# Patient Record
Sex: Male | Born: 1960
Health system: Southern US, Community
[De-identification: ages and names within clinical notes are randomized; demographics above are authoritative.]

## PROBLEM LIST (undated history)

## (undated) DIAGNOSIS — Z87438 Personal history of other diseases of male genital organs: Secondary | ICD-10-CM

## (undated) DIAGNOSIS — K922 Gastrointestinal hemorrhage, unspecified: Secondary | ICD-10-CM

## (undated) DIAGNOSIS — M47812 Spondylosis without myelopathy or radiculopathy, cervical region: Secondary | ICD-10-CM

## (undated) DIAGNOSIS — Z789 Other specified health status: Secondary | ICD-10-CM

## (undated) DIAGNOSIS — K219 Gastro-esophageal reflux disease without esophagitis: Secondary | ICD-10-CM

## (undated) DIAGNOSIS — D509 Iron deficiency anemia, unspecified: Secondary | ICD-10-CM

## (undated) DIAGNOSIS — I10 Essential (primary) hypertension: Secondary | ICD-10-CM

## (undated) DIAGNOSIS — K259 Gastric ulcer, unspecified as acute or chronic, without hemorrhage or perforation: Secondary | ICD-10-CM

## (undated) DIAGNOSIS — F1090 Alcohol use, unspecified, uncomplicated: Secondary | ICD-10-CM

## (undated) DIAGNOSIS — J690 Pneumonitis due to inhalation of food and vomit: Secondary | ICD-10-CM

## (undated) DIAGNOSIS — Z7289 Other problems related to lifestyle: Secondary | ICD-10-CM

## (undated) DIAGNOSIS — K2970 Gastritis, unspecified, without bleeding: Secondary | ICD-10-CM

## (undated) HISTORY — DX: Spondylosis without myelopathy or radiculopathy, cervical region: M47.812

## (undated) HISTORY — DX: Pneumonitis due to inhalation of food and vomit: J69.0

## (undated) HISTORY — DX: Gastrointestinal hemorrhage, unspecified: K92.2

## (undated) HISTORY — DX: Other problems related to lifestyle: Z72.89

## (undated) HISTORY — DX: Gastro-esophageal reflux disease without esophagitis: K21.9

## (undated) HISTORY — DX: Personal history of other diseases of male genital organs: Z87.438

## (undated) HISTORY — DX: Alcohol use, unspecified, uncomplicated: F10.90

## (undated) HISTORY — DX: Gastritis, unspecified, without bleeding: K29.70

## (undated) HISTORY — PX: ABDOMINAL SURGERY: SHX537

## (undated) HISTORY — DX: Gastric ulcer, unspecified as acute or chronic, without hemorrhage or perforation: K25.9

## (undated) HISTORY — PX: COLONOSCOPY: SHX174

## (undated) HISTORY — DX: Iron deficiency anemia, unspecified: D50.9

## (undated) HISTORY — DX: Other specified health status: Z78.9

---

## 1994-03-20 DIAGNOSIS — K220 Achalasia of cardia: Secondary | ICD-10-CM

## 1994-03-20 HISTORY — DX: Achalasia of cardia: K22.0

## 1994-03-20 HISTORY — PX: HELLER MYOTOMY: SHX5259

## 2019-09-21 ENCOUNTER — Emergency Department (HOSPITAL_BASED_OUTPATIENT_CLINIC_OR_DEPARTMENT_OTHER): Payer: BC Managed Care – PPO

## 2019-09-21 ENCOUNTER — Encounter (HOSPITAL_BASED_OUTPATIENT_CLINIC_OR_DEPARTMENT_OTHER): Payer: Self-pay | Admitting: Emergency Medicine

## 2019-09-21 ENCOUNTER — Emergency Department (HOSPITAL_BASED_OUTPATIENT_CLINIC_OR_DEPARTMENT_OTHER)
Admission: EM | Admit: 2019-09-21 | Discharge: 2019-09-21 | Disposition: A | Discharge status: Home / Self Care | Payer: BC Managed Care – PPO | Attending: Emergency Medicine | Admitting: Emergency Medicine

## 2019-09-21 ENCOUNTER — Other Ambulatory Visit: Payer: Self-pay

## 2019-09-21 DIAGNOSIS — Z79899 Other long term (current) drug therapy: Secondary | ICD-10-CM | POA: Insufficient documentation

## 2019-09-21 DIAGNOSIS — S61432A Puncture wound without foreign body of left hand, initial encounter: Secondary | ICD-10-CM | POA: Insufficient documentation

## 2019-09-21 DIAGNOSIS — Y9289 Other specified places as the place of occurrence of the external cause: Secondary | ICD-10-CM | POA: Diagnosis not present

## 2019-09-21 DIAGNOSIS — Y9389 Activity, other specified: Secondary | ICD-10-CM | POA: Insufficient documentation

## 2019-09-21 DIAGNOSIS — I10 Essential (primary) hypertension: Secondary | ICD-10-CM | POA: Diagnosis not present

## 2019-09-21 DIAGNOSIS — Y999 Unspecified external cause status: Secondary | ICD-10-CM | POA: Diagnosis not present

## 2019-09-21 DIAGNOSIS — W260XXA Contact with knife, initial encounter: Secondary | ICD-10-CM | POA: Insufficient documentation

## 2019-09-21 DIAGNOSIS — S61419A Laceration without foreign body of unspecified hand, initial encounter: Secondary | ICD-10-CM

## 2019-09-21 HISTORY — DX: Essential (primary) hypertension: I10

## 2019-09-21 MED ORDER — METOPROLOL TARTRATE 50 MG PO TABS
50.0000 mg | ORAL_TABLET | Freq: Two times a day (BID) | ORAL | 0 refills | Status: DC
Start: 2019-09-21 — End: 2020-02-13

## 2019-09-21 MED ORDER — LISINOPRIL 10 MG PO TABS
10.0000 mg | ORAL_TABLET | Freq: Every day | ORAL | 0 refills | Status: DC
Start: 2019-09-21 — End: 2020-02-13

## 2019-09-21 MED ORDER — METOPROLOL TARTRATE 50 MG PO TABS
25.0000 mg | ORAL_TABLET | Freq: Once | ORAL | Status: AC
  Administered 2019-09-21: 25 mg via ORAL
  Filled 2019-09-21: qty 1

## 2019-09-21 MED ORDER — TETANUS-DIPHTH-ACELL PERTUSSIS 5-2.5-18.5 LF-MCG/0.5 IM SUSP: 0.5000 mL | Freq: Once | INTRAMUSCULAR | Status: DC

## 2019-09-21 MED ORDER — CEPHALEXIN 500 MG PO CAPS
500.0000 mg | ORAL_CAPSULE | Freq: Two times a day (BID) | ORAL | 0 refills | Status: AC
Start: 2019-09-21 — End: 2019-09-24

## 2019-09-21 MED ORDER — LISINOPRIL 10 MG PO TABS
10.0000 mg | ORAL_TABLET | Freq: Once | ORAL | Status: AC
  Administered 2019-09-21: 10 mg via ORAL
  Filled 2019-09-21: qty 1

## 2019-09-21 MED ORDER — CEPHALEXIN 250 MG PO CAPS
500.0000 mg | ORAL_CAPSULE | Freq: Once | ORAL | Status: AC
  Administered 2019-09-21: 500 mg via ORAL
  Filled 2019-09-21: qty 2

## 2019-09-21 MED ORDER — LIDOCAINE-EPINEPHRINE 1 %-1:100000 IJ SOLN
10.0000 mL | Freq: Once | INTRAMUSCULAR | Status: AC
  Administered 2019-09-21: 10 mL via INTRADERMAL
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
## 2019-09-21 NOTE — ED Notes (Signed)
No acute bleeding noted at this time.  Pressure dressing in place to left hand.
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
## 2019-09-23 ENCOUNTER — Emergency Department (HOSPITAL_BASED_OUTPATIENT_CLINIC_OR_DEPARTMENT_OTHER)
Admission: EM | Admit: 2019-09-23 | Discharge: 2019-09-23 | Disposition: A | Discharge status: Home / Self Care | Payer: BC Managed Care – PPO | Attending: Emergency Medicine | Admitting: Emergency Medicine

## 2019-09-23 ENCOUNTER — Other Ambulatory Visit: Payer: Self-pay

## 2019-09-23 ENCOUNTER — Encounter (HOSPITAL_BASED_OUTPATIENT_CLINIC_OR_DEPARTMENT_OTHER): Payer: Self-pay

## 2019-09-23 DIAGNOSIS — Z5189 Encounter for other specified aftercare: Secondary | ICD-10-CM | POA: Diagnosis not present

## 2019-09-23 DIAGNOSIS — Z4800 Encounter for change or removal of nonsurgical wound dressing: Secondary | ICD-10-CM | POA: Insufficient documentation

## 2019-09-23 DIAGNOSIS — Z79899 Other long term (current) drug therapy: Secondary | ICD-10-CM | POA: Insufficient documentation

## 2019-09-23 DIAGNOSIS — I10 Essential (primary) hypertension: Secondary | ICD-10-CM | POA: Insufficient documentation
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
## 2019-09-23 NOTE — ED Notes (Signed)
Pt discharged to home. Discharge instructions have been discussed with patient and/or family members. Pt verbally acknowledges understanding d/c instructions, and endorses comprehension to checkout at registration before leaving.  °

## 2019-09-23 NOTE — Discharge Instructions (Addendum)
Keep wound covered with a bandaid. I would not recommend wrapping it as it seems to be causing some swelling. While at home please rest, ice, and elevate your hand above your heart to reduce the swelling.  Begin taking your antibiotic as prescribed. Continue taking Ibuprofen as needed for the pain.  Return in 10 days from suture placement for removal. Return to the ED sooner for any signs of infection including redness/swelling around the sutures themselves, drainage of pus, fevers > 100.4, or chills.

## 2019-10-01 ENCOUNTER — Emergency Department (HOSPITAL_BASED_OUTPATIENT_CLINIC_OR_DEPARTMENT_OTHER)
Admission: EM | Admit: 2019-10-01 | Discharge: 2019-10-01 | Disposition: A | Discharge status: Home / Self Care | Payer: BC Managed Care – PPO | Attending: Emergency Medicine | Admitting: Emergency Medicine

## 2019-10-01 ENCOUNTER — Other Ambulatory Visit: Payer: Self-pay

## 2019-10-01 ENCOUNTER — Encounter (HOSPITAL_BASED_OUTPATIENT_CLINIC_OR_DEPARTMENT_OTHER): Payer: Self-pay

## 2019-10-01 DIAGNOSIS — I10 Essential (primary) hypertension: Secondary | ICD-10-CM | POA: Diagnosis not present

## 2019-10-01 DIAGNOSIS — Z4802 Encounter for removal of sutures: Secondary | ICD-10-CM | POA: Diagnosis not present

## 2019-10-01 DIAGNOSIS — Z79899 Other long term (current) drug therapy: Secondary | ICD-10-CM | POA: Diagnosis not present

## 2019-10-01 NOTE — Discharge Instructions (Addendum)
Please make sure to keep the area clean and dry.  Monitor closely for worsening signs of infection which includes increasing redness, drainage, pus, swelling, fevers, chills.  Your blood pressure is still high today, please make sure to call [place] and wellness with the phone number provided and schedule an appointment as soon as possible.   You may also call the phone number on your discharge paperwork since you have insurance in order to help establish with a primary care doctor.  You need to make sure to do this as your blood pressure is still high today and this is very important for management of your chronic problems. Return to the ER if your symptoms worsen.

## 2019-10-01 NOTE — ED Provider Notes (Signed)
[facility] EMERGENCY DEPARTMENT Provider Note   CSN: [id number] Arrival date & time: 10/01/19  1348     History Chief Complaint  Patient presents with  . Suture / Staple Removal    Nathaniel Williams is a 60 y.o. male.  HPI 59 year old male with a history of hypertension, presents to the ER for suture removal.  Patient was seen here in the ER 10 days ago when he accidentally stabbed himself into his left hand while trying to open a can.  He was noted to have a possible arterial bleed as he had heavy bleeding that required a tourniquet to decrease.  Wound was irrigated, and imaged, discussed sutured.  Patient was also given Keflex.  He was also noted to be hypertensive in the ED, and was given a prescription for blood pressure medications.  He does have insurance, but has not followed up with any primary care doctor since then.  He reports compliance with the blood pressure medications he was given which include metoprolol and lisinopril.  He denies any fevers or chills, or pain to the left hand.  He does note that it is still a little bit swollen.  No numbness or tingling.  He has been compliant with antibiotics and has been keeping the area clean and dry.    Past Medical History:  Diagnosis Date  . Hypertension     There are no problems to display for this patient.   Past Surgical History:  Procedure Laterality Date  . ABDOMINAL SURGERY         No family history on file.  Social History   Tobacco Use  . Smoking status: Never Smoker  . Smokeless tobacco: Never Used  Substance Use Topics  . Alcohol use: Yes    Comment: occ  . Drug use: Never    Home Medications Prior to Admission medications   Medication Sig Start Date End Date Taking? Authorizing Provider  lisinopril (ZESTRIL) 10 MG tablet Take 1 tablet (10 mg total) by mouth daily. 09/21/19 10/21/19  Fatima Blank, MD  lisinopril (ZESTRIL) 40 MG tablet Take by mouth. 05/17/18   [provider]  metoprolol succinate (TOPROL-XL) 50 MG 24 hr tablet Take by mouth. 08/22/18   [provider]  metoprolol tartrate (LOPRESSOR) 50 MG tablet Take 1 tablet (50 mg total) by mouth 2 (two) times daily. 09/21/19 10/21/19  Fatima Blank, MD    Allergies    Patient has no known allergies.  Review of Systems   Review of Systems  Constitutional: Negative for chills and fever.  Skin: Positive for wound.  Neurological: Negative for weakness and numbness.    Physical Exam Updated Vital Signs BP (!) 185/102 (BP Location: Right Arm)   Pulse 63   Temp 98.3 F (36.8 C)   Resp 16   Ht 5\' 9"  (1.753 m)   Wt 54 kg   SpO2 98%   BMI 17.57 kg/m   Physical Exam Vitals and nursing note reviewed.  Constitutional:      General: He is not in acute distress.    Appearance: Normal appearance. He is well-developed. He is not ill-appearing or diaphoretic.  HENT:     Head: Normocephalic and atraumatic.  Eyes:     Conjunctiva/sclera: Conjunctivae normal.  Cardiovascular:     Rate and Rhythm: Normal rate and regular rhythm.     Pulses: Normal pulses.     Heart sounds: Normal heart sounds. No murmur heard.   Pulmonary:  Effort: Pulmonary effort is normal. No respiratory distress.     Breath sounds: Normal breath sounds.  Abdominal:     Palpations: Abdomen is soft.     Tenderness: There is no abdominal tenderness.  Musculoskeletal:        General: Swelling (mild swelling to left hand ) present. No tenderness. Normal range of motion.     Cervical back: Neck supple.  Skin:    General: Skin is warm and dry.     Findings: No bruising, erythema or rash.     Comments: Left hand with evidence of skin tear, with some trace bleeding.  Mild swelling to the hand, however no overlying erythema, fluctuance, excessive drainage.  Full range of motion of all 5 digits.  2+ radial pulses.  Neurological:     General: No focal deficit present.     Mental Status: He is alert and oriented to person,  place, and time.     Sensory: No sensory deficit.     Motor: No weakness.  Psychiatric:        Mood and Affect: Mood normal.        Behavior: Behavior normal.       ED Results / Procedures / Treatments   Labs (all labs ordered are listed, but only abnormal results are displayed) Labs Reviewed - No data to display  EKG None  Radiology No results found.  Procedures Procedures (including critical care time)  Medications Ordered in ED Medications - No data to display  ED Course  I have reviewed the triage vital signs and the nursing notes.  Pertinent labs & imaging results that were available during my care of the patient were reviewed by me and considered in my medical decision making (see chart for details).    MDM Rules/Calculators/A&P                         59 year old male here for suture removal Left hand with some mild swelling, scant blood coming from the wound, however sutures still intact.  No evidence of dehiscence.  No tenderness to palpation, patient moving all 5 digits without difficulty.  Good pulses and sensations intact.  Patient states that he has been compliant with his course of Keflex.  Patient was also seen by Dr. Stark Jock, we feel as though his sutures can be removed and no additional antibiotic treatment is needed at this time.  There is no signs of cellulitis, abscess, or worsening infection.  He was still noted to be hypertensive here in the ED with a blood pressure 185/102, and states he has been compliant with blood pressure medications.  He has not followed up with the PCP.  I stressed that he really needs to establish with 1, directed him to [facility] health and wellness or the phone number on the discharge paperwork in order to establish with 1.  He does not have any headaches, chest pain, shortness of breath.  No signs of hypertensive urgency/emergency.  I gave him strict return precautions for worsening redness, swelling, discharge, fevers, chills or pain in  the hand.  He voices understanding and is agreeable to this plan.  At this stage in the ED course, the patient is medically screened and stable for discharge.  Final Clinical Impression(s) / ED Diagnoses Final diagnoses:  Visit for suture removal    Rx / DC Orders ED Discharge Orders    None       Garald Balding, PA-C 10/01/19  1191    Veryl Speak, MD 10/01/19 2358

## 2019-10-01 NOTE — ED Triage Notes (Signed)
Pt for suture removal left hand-NAD-steady gait

## 2020-02-13 ENCOUNTER — Encounter (HOSPITAL_BASED_OUTPATIENT_CLINIC_OR_DEPARTMENT_OTHER): Payer: Self-pay

## 2020-02-13 ENCOUNTER — Emergency Department (HOSPITAL_BASED_OUTPATIENT_CLINIC_OR_DEPARTMENT_OTHER)
Admission: EM | Admit: 2020-02-13 | Discharge: 2020-02-13 | Disposition: A | Discharge status: Home / Self Care | Payer: BC Managed Care – PPO | Attending: Emergency Medicine | Admitting: Emergency Medicine

## 2020-02-13 ENCOUNTER — Other Ambulatory Visit: Payer: Self-pay

## 2020-02-13 DIAGNOSIS — I10 Essential (primary) hypertension: Secondary | ICD-10-CM | POA: Insufficient documentation

## 2020-02-13 DIAGNOSIS — Z79899 Other long term (current) drug therapy: Secondary | ICD-10-CM | POA: Insufficient documentation

## 2020-02-13 DIAGNOSIS — R030 Elevated blood-pressure reading, without diagnosis of hypertension: Secondary | ICD-10-CM | POA: Diagnosis present

## 2020-02-13 LAB — CBC WITH DIFFERENTIAL/PLATELET
Abs Immature Granulocytes: 0.01 10*3/uL (ref 0.00–0.07)
Basophils Absolute: 0 10*3/uL (ref 0.0–0.1)
Basophils Relative: 1 %
Eosinophils Absolute: 0.1 10*3/uL (ref 0.0–0.5)
Eosinophils Relative: 2 %
HCT: 41 % (ref 39.0–52.0)
Hemoglobin: 14 g/dL (ref 13.0–17.0)
Immature Granulocytes: 0 %
Lymphocytes Relative: 36 %
Lymphs Abs: 1.7 10*3/uL (ref 0.7–4.0)
MCH: 29.7 pg (ref 26.0–34.0)
MCHC: 34.1 g/dL (ref 30.0–36.0)
MCV: 86.9 fL (ref 80.0–100.0)
Monocytes Absolute: 0.5 10*3/uL (ref 0.1–1.0)
Monocytes Relative: 10 %
Neutro Abs: 2.4 10*3/uL (ref 1.7–7.7)
Neutrophils Relative %: 51 %
Platelets: 209 10*3/uL (ref 150–400)
RBC: 4.72 MIL/uL (ref 4.22–5.81)
RDW: 11.9 % (ref 11.5–15.5)
WBC: 4.6 10*3/uL (ref 4.0–10.5)
nRBC: 0 % (ref 0.0–0.2)

## 2020-02-13 LAB — URINALYSIS, ROUTINE W REFLEX MICROSCOPIC
Bilirubin Urine: NEGATIVE
Glucose, UA: NEGATIVE mg/dL
Hgb urine dipstick: NEGATIVE
Ketones, ur: NEGATIVE mg/dL
Leukocytes,Ua: NEGATIVE
Nitrite: NEGATIVE
Protein, ur: NEGATIVE mg/dL
Specific Gravity, Urine: 1.02 (ref 1.005–1.030)
pH: 6.5 (ref 5.0–8.0)

## 2020-02-13 LAB — BASIC METABOLIC PANEL
Anion gap: 9 (ref 5–15)
BUN: 18 mg/dL (ref 8–23)
CO2: 28 mmol/L (ref 22–32)
Calcium: 9 mg/dL (ref 8.9–10.3)
Chloride: 102 mmol/L (ref 98–111)
Creatinine, Ser: 0.99 mg/dL (ref 0.61–1.24)
GFR, Estimated: >60 mL/min (ref >60)
Glucose, Bld: 102 mg/dL — ABNORMAL HIGH (ref 70–99)
Potassium: 3.2 mmol/L — ABNORMAL LOW (ref 3.5–5.1)
Sodium: 139 mmol/L (ref 135–145)

## 2020-02-13 MED ORDER — METOPROLOL TARTRATE 50 MG PO TABS: 50.0000 mg | ORAL_TABLET | Freq: Two times a day (BID) | ORAL | 2 refills | Status: DC

## 2020-02-13 MED ORDER — METOPROLOL TARTRATE 25 MG PO TABS
50.0000 mg | ORAL_TABLET | Freq: Once | ORAL | Status: AC
  Administered 2020-02-13: 50 mg via ORAL
  Filled 2020-02-13: qty 2

## 2020-02-13 MED ORDER — LISINOPRIL 10 MG PO TABS
10.0000 mg | ORAL_TABLET | Freq: Once | ORAL | Status: AC
  Administered 2020-02-13: 10 mg via ORAL
  Filled 2020-02-13: qty 1

## 2020-02-13 MED ORDER — LISINOPRIL 10 MG PO TABS: 10.0000 mg | ORAL_TABLET | Freq: Every day | ORAL | 2 refills | Status: DC

## 2020-02-13 NOTE — ED Provider Notes (Signed)
Nathaniel Williams   CSN: [id number] Arrival date & time: 02/13/20  0844     History Chief Complaint  Patient presents with  . Hypertension    Nathaniel Williams is a 59 y.o. male.  Pt presents to the ED today with high blood pressure.  He has had bp issues for several years, but does not have a pcp to monitor his bp.  He was here in July and had his meds refilled.  He took them until they ran out, then stopped taking them.  He was seen at an urgent care on 11/24 for an injury at work.  He was told his bp was elevated and told to come to the ED for treatment.  He developed some neck pain last night, so thought he'd come in today for bp meds.  He does have medical insurance.        Past Medical History:  Diagnosis Date  . Hypertension     There are no problems to display for this patient.   Past Surgical History:  Procedure Laterality Date  . ABDOMINAL SURGERY         History reviewed. No pertinent family history.  Social History   Tobacco Use  . Smoking status: Never Smoker  . Smokeless tobacco: Never Used  Substance Use Topics  . Alcohol use: Not Currently  . Drug use: Never    Home Medications Prior to Admission medications   Medication Sig Start Date End Date Taking? Authorizing Provider  lisinopril (ZESTRIL) 10 MG tablet Take 1 tablet (10 mg total) by mouth daily. 02/13/20 05/13/20  Isla Pence, MD  metoprolol tartrate (LOPRESSOR) 50 MG tablet Take 1 tablet (50 mg total) by mouth 2 (two) times daily. 02/13/20 05/13/20  Isla Pence, MD  metoprolol succinate (TOPROL-XL) 50 MG 24 hr tablet Take by mouth. 08/22/18 02/13/20  [provider]    Allergies    Patient has no known allergies.  Review of Systems   Review of Systems  Musculoskeletal: Positive for neck pain.  All other systems reviewed and are negative.   Physical Exam Updated Vital Signs BP (!) 199/125 (BP Location: Right Arm)   Pulse 69    Temp 98 F (36.7 C) (Oral)   Resp 18   Ht 5\' 9"  (1.753 m)   Wt 55.3 kg   SpO2 97%   BMI 18.02 kg/m   Physical Exam Vitals and nursing Williams reviewed.  Constitutional:      Appearance: Normal appearance.  HENT:     Head: Normocephalic and atraumatic.     Right Ear: External ear normal.     Left Ear: External ear normal.     Nose: Nose normal.     Mouth/Throat:     Mouth: Mucous membranes are moist.     Pharynx: Oropharynx is clear.  Eyes:     Extraocular Movements: Extraocular movements intact.     Conjunctiva/sclera: Conjunctivae normal.     Pupils: Pupils are equal, round, and reactive to light.  Cardiovascular:     Rate and Rhythm: Normal rate and regular rhythm.     Pulses: Normal pulses.     Heart sounds: Normal heart sounds.  Pulmonary:     Effort: Pulmonary effort is normal.     Breath sounds: Normal breath sounds.  Abdominal:     General: Abdomen is flat. Bowel sounds are normal.     Palpations: Abdomen is soft.  Musculoskeletal:        General:  Normal range of motion.     Cervical back: Normal range of motion and neck supple.  Skin:    General: Skin is warm and dry.     Capillary Refill: Capillary refill takes less than 2 seconds.  Neurological:     General: No focal deficit present.     Mental Status: He is alert and oriented to person, place, and time.  Psychiatric:        Mood and Affect: Mood normal.        Behavior: Behavior normal.     ED Results / Procedures / Treatments   Labs (all labs ordered are listed, but only abnormal results are displayed) Labs Reviewed  BASIC METABOLIC PANEL - Abnormal; Notable for the following components:      Result Value   Potassium 3.2 (*)    Glucose, Bld 102 (*)    All other components within normal limits  CBC WITH DIFFERENTIAL/PLATELET  URINALYSIS, ROUTINE W REFLEX MICROSCOPIC    EKG EKG Interpretation  Date/Time:  Friday February 13 2020 09:49:20 EST Ventricular Rate:  64 PR Interval:    QRS  Duration: 73 QT Interval:  437 QTC Calculation: 451 R Axis:   81 Text Interpretation: Sinus rhythm Left atrial enlargement Borderline right axis deviation LVH with secondary repolarization abnormality Minimal ST elevation, inferior leads No old tracing to compare Confirmed by Isla Pence [phone]) on 02/13/2020 9:54:38 AM   Radiology No results found.  Procedures Procedures (including critical care time)  Medications Ordered in ED Medications  lisinopril (ZESTRIL) tablet 10 mg (10 mg Oral Given 02/13/20 0923)  metoprolol tartrate (LOPRESSOR) tablet 50 mg (50 mg Oral Given 02/13/20 2671)    ED Course  I have reviewed the triage vital signs and the nursing notes.  Pertinent labs & imaging results that were available during my care of the patient were reviewed by me and considered in my medical decision making (see chart for details).    MDM Rules/Calculators/A&P                          Pt given a dose of his meds here and is d/c with a 90 day supply.  He is encouraged to establish with a primary care provider.  He is encouraged to eat a low salt, and healthy diet.  Return if worse.  Final Clinical Impression(s) / ED Diagnoses Final diagnoses:  Primary hypertension    Rx / DC Orders ED Discharge Orders         Ordered    lisinopril (ZESTRIL) 10 MG tablet  Daily        02/13/20 0953    metoprolol tartrate (LOPRESSOR) 50 MG tablet  2 times daily        02/13/20 2458           Isla Pence, MD 02/13/20 [phone]

## 2020-02-13 NOTE — Discharge Instructions (Addendum)
It is very important you establish yourself with a PCP.  Call on Monday for an appointment.

## 2020-02-13 NOTE — ED Triage Notes (Signed)
Pt reports he stopped taking his blood pressure medication in July and since then has had high blood pressure. [facility] and told to go to ER for hypertension but felt fine so he didn't go. Pt states his neck started hurting yesterday so he decided to come in. Pt states he was taking metoprolol and lisinopril but doesn't have a primary care to refill the prescription.

## 2020-04-23 ENCOUNTER — Other Ambulatory Visit: Payer: Self-pay

## 2020-04-23 ENCOUNTER — Other Ambulatory Visit: Payer: Medicaid Other

## 2020-04-23 DIAGNOSIS — Z20822 Contact with and (suspected) exposure to covid-19: Secondary | ICD-10-CM

## 2020-04-24 LAB — NOVEL CORONAVIRUS, NAA: SARS-CoV-2, NAA: NOT DETECTED

## 2020-04-24 LAB — SARS-COV-2, NAA 2 DAY TAT

## 2020-04-26 ENCOUNTER — Other Ambulatory Visit: Payer: Self-pay

## 2020-04-26 ENCOUNTER — Ambulatory Visit (INDEPENDENT_AMBULATORY_CARE_PROVIDER_SITE_OTHER): Payer: Self-pay | Admitting: Medical

## 2020-04-26 ENCOUNTER — Encounter: Payer: Self-pay | Admitting: Medical

## 2020-04-26 VITALS — BP 132/76 | HR 58 | Temp 98.2°F | Resp 12 | Ht 70.2 in | Wt 121.4 lb

## 2020-04-26 DIAGNOSIS — R634 Abnormal weight loss: Secondary | ICD-10-CM

## 2020-04-26 DIAGNOSIS — Z113 Encounter for screening for infections with a predominantly sexual mode of transmission: Secondary | ICD-10-CM

## 2020-04-26 DIAGNOSIS — R630 Anorexia: Secondary | ICD-10-CM

## 2020-04-26 DIAGNOSIS — I10 Essential (primary) hypertension: Secondary | ICD-10-CM

## 2020-04-26 DIAGNOSIS — Z125 Encounter for screening for malignant neoplasm of prostate: Secondary | ICD-10-CM

## 2020-04-26 DIAGNOSIS — K219 Gastro-esophageal reflux disease without esophagitis: Secondary | ICD-10-CM

## 2020-04-26 DIAGNOSIS — R350 Frequency of micturition: Secondary | ICD-10-CM

## 2020-04-26 DIAGNOSIS — E860 Dehydration: Secondary | ICD-10-CM

## 2020-04-26 DIAGNOSIS — R142 Eructation: Secondary | ICD-10-CM

## 2020-04-26 DIAGNOSIS — R5383 Other fatigue: Secondary | ICD-10-CM

## 2020-04-26 DIAGNOSIS — Z1211 Encounter for screening for malignant neoplasm of colon: Secondary | ICD-10-CM

## 2020-04-26 DIAGNOSIS — G4700 Insomnia, unspecified: Secondary | ICD-10-CM

## 2020-04-26 LAB — CBC WITH DIFFERENTIAL/PLATELET
Basophils Absolute: 0 10*3/uL (ref 0.0–0.1)
Basophils Relative: 0.5 % (ref 0.0–3.0)
Eosinophils Absolute: 0.2 10*3/uL (ref 0.0–0.7)
Eosinophils Relative: 2.5 % (ref 0.0–5.0)
HCT: 42.3 % (ref 39.0–52.0)
Hemoglobin: 14.4 g/dL (ref 13.0–17.0)
Lymphocytes Relative: 15.5 % (ref 12.0–46.0)
Lymphs Abs: 1 10*3/uL (ref 0.7–4.0)
MCHC: 34.1 g/dL (ref 30.0–36.0)
MCV: 86.1 fl (ref 78.0–100.0)
Monocytes Absolute: 0.4 10*3/uL (ref 0.1–1.0)
Monocytes Relative: 6.7 % (ref 3.0–12.0)
Neutro Abs: 4.9 10*3/uL (ref 1.4–7.7)
Neutrophils Relative %: 74.8 % (ref 43.0–77.0)
Platelets: 293 10*3/uL (ref 150.0–400.0)
RBC: 4.91 Mil/uL (ref 4.22–5.81)
RDW: 13.2 % (ref 11.5–15.5)
WBC: 6.6 10*3/uL (ref 4.0–10.5)

## 2020-04-26 LAB — POC URINALSYSI DIPSTICK (AUTOMATED)
Bilirubin, UA: 1
Blood, UA: NEGATIVE
Glucose, UA: NEGATIVE
Leukocytes, UA: NEGATIVE
Nitrite, UA: NEGATIVE
Protein, UA: POSITIVE — AB
Spec Grav, UA: >=1.03 — AB (ref 1.010–1.025)
Urobilinogen, UA: 1 E.U./dL
pH, UA: 6 (ref 5.0–8.0)

## 2020-04-26 LAB — IRON: Iron: 108 ug/dL (ref 42–165)

## 2020-04-26 LAB — COMPREHENSIVE METABOLIC PANEL
ALT: 14 U/L (ref 0–53)
AST: 17 U/L (ref 0–37)
Albumin: 4.2 g/dL (ref 3.5–5.2)
Alkaline Phosphatase: 51 U/L (ref 39–117)
BUN: 9 mg/dL (ref 6–23)
CO2: 31 mEq/L (ref 19–32)
Calcium: 9.6 mg/dL (ref 8.4–10.5)
Chloride: 100 mEq/L (ref 96–112)
Creatinine, Ser: 0.97 mg/dL (ref 0.40–1.50)
GFR: 84.35 mL/min (ref >60.00)
Glucose, Bld: 102 mg/dL — ABNORMAL HIGH (ref 70–99)
Potassium: 3.5 mEq/L (ref 3.5–5.1)
Sodium: 138 mEq/L (ref 135–145)
Total Bilirubin: 0.7 mg/dL (ref 0.2–1.2)
Total Protein: 6.8 g/dL (ref 6.0–8.3)

## 2020-04-26 LAB — PSA: PSA: 0.88 ng/mL (ref 0.10–4.00)

## 2020-04-26 LAB — VITAMIN B12: Vitamin B-12: 1485 pg/mL — ABNORMAL HIGH (ref 211–911)

## 2020-04-26 LAB — LIPASE: Lipase: 53 U/L (ref 11.0–59.0)

## 2020-04-26 LAB — TSH: TSH: 1.79 u[IU]/mL (ref 0.35–4.50)

## 2020-04-26 LAB — T4, FREE: Free T4: 1.3 ng/dL (ref 0.60–1.60)

## 2020-04-26 MED ORDER — METOPROLOL SUCCINATE ER 50 MG PO TB24: 50.0000 mg | ORAL_TABLET | Freq: Every day | ORAL | 1 refills | Status: DC

## 2020-04-26 MED ORDER — LISINOPRIL 10 MG PO TABS: 10.0000 mg | ORAL_TABLET | Freq: Every day | ORAL | 1 refills | Status: DC

## 2020-04-26 NOTE — Patient Instructions (Addendum)
Nice to meet you today.  History of hypertension and blood pressure is well controlled today.  I refilled your lisinopril 10 mg tablet daily and metoprolol 50 mg daily tablet.  Note I did write extended release version of metoprolol.  On review history of abnormal weight loss per records and recent loss of appetite.  I do not see prior colonoscopy done in the past.  So went ahead and referred to GI for screening colonoscopy.  In addition added lipase labs today.  For fatigue and history of hypertension ordered CBC, CMP, TSH, T4, B12, B1, vitamin D and iron level.  Screening HIV labs and screening PSA.  For recent frequent urination will follow PSA and did get urine POCT.  History of belching intermittently and history of GERD but no obvious reflux reported presently.  Did add H. pylori breath test to labs.  Recent insomnia couple days a week.  Discussed can try melatonin over-the-counter.  You do appear to have some dehydration. Ketones on urine. Very important to hydrate well with propel fitiness water or sugar free gatorade. Eat bland diet.  Follow-up in 2 weeks or as needed.

## 2020-04-26 NOTE — Progress Notes (Signed)
Subjective:    Patient ID: Nathaniel Williams, male    DOB: December 14, 1960, 60 y.o.   MRN: [id number]  HPI   Pt in for first time.  Pt works [employer]ar[employer]. Full time now. He had lost insurance briefly. Pt does not exercise apart from work.  Stopped smoking more than 15 years ago.   Pt states he has lost of appetitie. Decreased smell and some HA. He state he tested 4 times last weeks for covid and all negative. Pt lost some weight over past week. Usually he states weight 130. He states 130-135 lb for 10 or more years. IN epic found stating abnormal wt loss. His ha is resolved. No gross motor or sensory function deficits.  Pt has gotten covid booster. His first covid vaccine wa 03/18/20.   Hx of htn. His bp was 199/125 on ED visit in November. In past had been on lisinopril 10 mg daily and metoprolol 25 mg q day. Bp now controlled when placed       Review of Systems  Constitutional: Negative for chills, diaphoresis and fatigue.  HENT: Negative for congestion.   Respiratory: Negative for cough, chest tightness, shortness of breath and wheezing.   Cardiovascular: Negative for chest pain and palpitations.  Gastrointestinal: Negative for abdominal pain.  Genitourinary: Negative for dysuria.  Musculoskeletal: Negative for back pain.  Neurological: Negative for dizziness, weakness, light-headedness and numbness.  Hematological: Negative for adenopathy.  Psychiatric/Behavioral: Negative for behavioral problems and confusion.  Left will labs follow-up Past Medical History:  Diagnosis Date  . Hypertension      Social History   Socioeconomic History  . Marital status: Legally Separated    Spouse name: Not on file  . Number of children: Not on file  . Years of education: Not on file  . Highest education level: Not on file  Occupational History  . Not on file  Tobacco Use  . Smoking status: Former Smoker    Packs/day: 0.25    Years: 18.00    Pack years: 4.50    Types: Cigarettes  .  Smokeless tobacco: Never Used  Vaping Use  . Vaping Use: Never used  Substance and Sexual Activity  . Alcohol use: Not Currently  . Drug use: Never  . Sexual activity: Not on file  Other Topics Concern  . Not on file  Social History Narrative  . Not on file   Social Determinants of Health   Financial Resource Strain: Not on file  Food Insecurity: Not on file  Transportation Needs: Not on file  Physical Activity: Not on file  Stress: Not on file  Social Connections: Not on file  Intimate Partner Violence: Not on file    Past Surgical History:  Procedure Laterality Date  . ABDOMINAL SURGERY      Family History  Problem Relation Age of Onset  . Cervical cancer Mother   . Alzheimer's disease Father     No Known Allergies  Current Outpatient Medications on File Prior to Visit  Medication Sig Dispense Refill  . Ascorbic Acid (VITAMIN C) 1000 MG tablet Take 1,000 mg by mouth daily.    . B Complex-C (SUPER B COMPLEX PO) Take 1 tablet by mouth daily.    . Cholecalciferol (VITAMIN D3) 50 MCG (2000 UT) capsule Take 2,000 Units by mouth daily.    Marland Kitchen ibuprofen (ADVIL) 200 MG tablet Take 400 mg by mouth in the morning and at bedtime.    Marland Kitchen lisinopril (ZESTRIL) 10 MG tablet Take 1 tablet (  10 mg total) by mouth daily. 30 tablet 2  . metoprolol tartrate (LOPRESSOR) 50 MG tablet Take 1 tablet (50 mg total) by mouth 2 (two) times daily. 60 tablet 2  . Misc Natural Products (OSTEO BI-FLEX ADV TRIPLE ST PO) Take 1 tablet by mouth daily.    Marland Kitchen OVER THE COUNTER MEDICATION Take 1 tablet by mouth daily. [place] MEGA M[employer]ER[employer] AND METABOBLISM    . Turmeric Curcumin 500 MG CAPS Take 1 capsule by mouth daily.    . vitamin E 180 MG (400 UNITS) capsule Take 400 Units by mouth daily.    . [DISCONTINUED] metoprolol succinate (TOPROL-XL) 50 MG 24 hr tablet Take by mouth.     No current facility-administered medications on file prior to visit.    BP 132/76 (BP Location: Right Arm, Cuff Size: Normal)    Pulse (!) 58   Temp 98.2 F (36.8 C) (Oral)   Resp 12   Ht 5' 10.2" (1.783 m)   Wt 121 lb 6.4 oz (55.1 kg)   SpO2 99%   BMI 17.32 kg/m      Objective:   Physical Exam  General Mental Status- Alert. General Appearance- Not in acute distress.   Skin General: Color- Normal Color. Moisture- Normal Moisture.  Neck Carotid Arteries- Normal color. Moisture- Normal Moisture. No carotid bruits. No JVD.  Chest and Lung Exam Auscultation: Breath Sounds:-Normal.  Cardiovascular Auscultation:Rythm- Regular. Murmurs & Other Heart Sounds:Auscultation of the heart reveals- No Murmurs.  Abdomen Inspection:-Inspeection Normal. Palpation/Percussion:Note:No mass. Palpation and Percussion of the abdomen reveal- Non Tender, Non Distended + BS, no rebound or guarding.    Neurologic Cranial Nerve exam:- CN III-XII intact(No nystagmus), symmetric smile. Strength:- 5/5 equal and symmetric strength both upper and lower extremities.       Assessment & Plan:  Nice to meet you today.  History of hypertension and blood pressure is well controlled today.  I refilled your lisinopril 10 mg tablet daily and metoprolol 50 mg daily tablet.  Note I did write extended release version of metoprolol.  On review history of abnormal weight loss per records and recent loss of appetite.  I do not see prior colonoscopy done in the past.  So went ahead and referred to GI for screening colonoscopy.  In addition added lipase labs today.  For fatigue and history of hypertension ordered CBC, CMP, TSH, T4, B12, B1, vitamin D and iron level.  Screening HIV labs and screening PSA.  For recent frequent urination will follow PSA and did get urine POCT.  History of belching intermittently and history of GERD but no obvious reflux reported presently.  Did add H. pylori breath test to labs.  Recent insomnia couple days a week.  Discussed can try melatonin over-the-counter.   You do appear to have some dehydration.  Ketones on urine. Very important to hydrate well with propel fitiness water or sugar free gatorade. Eat bland diet.  Follow-up in 2 weeks or as needed.  Time spent with patient today was 45  minutes which consisted of chart review, discussing diagnoses, work up, treatment and documentation.

## 2020-04-27 LAB — H. PYLORI BREATH TEST: H. pylori Breath Test: NOT DETECTED

## 2020-04-29 LAB — VITAMIN D 1,25 DIHYDROXY
Vitamin D 1, 25 (OH)2 Total: 39 pg/mL (ref 18–72)
Vitamin D2 1, 25 (OH)2: <8 pg/mL
Vitamin D3 1, 25 (OH)2: 39 pg/mL

## 2020-04-29 LAB — HIV ANTIBODY (ROUTINE TESTING W REFLEX): HIV 1&2 Ab, 4th Generation: NONREACTIVE

## 2020-04-30 LAB — VITAMIN B1: Vitamin B1 (Thiamine): 36 nmol/L — ABNORMAL HIGH (ref 8–30)

## 2020-05-11 ENCOUNTER — Other Ambulatory Visit: Payer: Self-pay

## 2020-05-11 ENCOUNTER — Ambulatory Visit (INDEPENDENT_AMBULATORY_CARE_PROVIDER_SITE_OTHER): Payer: Self-pay | Admitting: Medical

## 2020-05-11 VITALS — BP 151/97 | HR 72 | Temp 98.7°F | Resp 20 | Ht 70.0 in | Wt 121.6 lb

## 2020-05-11 DIAGNOSIS — R634 Abnormal weight loss: Secondary | ICD-10-CM

## 2020-05-11 DIAGNOSIS — I10 Essential (primary) hypertension: Secondary | ICD-10-CM

## 2020-05-11 DIAGNOSIS — R5383 Other fatigue: Secondary | ICD-10-CM

## 2020-05-11 DIAGNOSIS — R630 Anorexia: Secondary | ICD-10-CM

## 2020-05-11 DIAGNOSIS — R142 Eructation: Secondary | ICD-10-CM

## 2020-05-11 MED ORDER — OMEPRAZOLE 20 MG PO CPDR: 20.0000 mg | DELAYED_RELEASE_CAPSULE | Freq: Every day | ORAL | 3 refills | Status: DC

## 2020-05-11 NOTE — Progress Notes (Signed)
Subjective:    Patient ID: Nathaniel Williams, male    DOB: 1960/11/01, 60 y.o.   MRN: [id number]  HPI  Pt in for follow up.   Pt states he has decreased appetite.Pt has belching through out the day. He states he will belch even without eating.   On last visit I put in referral to GI. GI called him but he feels can't scheduled until he get insurance issue resolved. He is trying to call HR. Pt indicates his insurance did not automatically renew.  Last visit portion of A/P in " "On review history of abnormal weight loss per records and recent loss of appetite.  I do not see prior colonoscopy done in the past.  So went ahead and referred to GI for screening colonoscopy.  In addition added lipase labs today."  Pt used to smoke. Smoked 60 years old to 60 years old.Pack lasted 3-4 days.    On review pt did weight max 140 when he was in his 30's. States when he was 60 year old weight about 130.  Pt h pylori was negative. Cbc and cmp was negative. Mild sugar was elevated.  Lipase was normal.   Review of Systems  Constitutional: Negative for chills, fatigue and fever.  HENT: Negative for congestion and drooling.   Respiratory: Negative for cough, chest tightness, shortness of breath and wheezing.   Cardiovascular: Negative for chest pain and palpitations.  Gastrointestinal: Positive for abdominal pain and nausea. Negative for constipation, diarrhea and vomiting.       Occasioaal abdomen pain when he belches.  Genitourinary: Negative for dysuria and flank pain.  Musculoskeletal: Negative for back pain and joint swelling.  Neurological: Negative for dizziness, speech difficulty, weakness, light-headedness and headaches.  Hematological: Negative for adenopathy. Does not bruise/bleed easily.  Psychiatric/Behavioral: Negative for behavioral problems, dysphoric mood and suicidal ideas. The patient is not nervous/anxious and is not hyperactive.    Past Medical History:  Diagnosis Date  .  Hypertension      Social History   Socioeconomic History  . Marital status: Legally Separated    Spouse name: Not on file  . Number of children: Not on file  . Years of education: Not on file  . Highest education level: Not on file  Occupational History  . Not on file  Tobacco Use  . Smoking status: Former Smoker    Packs/day: 0.25    Years: 18.00    Pack years: 4.50    Types: Cigarettes  . Smokeless tobacco: Never Used  Vaping Use  . Vaping Use: Never used  Substance and Sexual Activity  . Alcohol use: Not Currently  . Drug use: Never  . Sexual activity: Not on file  Other Topics Concern  . Not on file  Social History Narrative  . Not on file   Social Determinants of Health   Financial Resource Strain: Not on file  Food Insecurity: Not on file  Transportation Needs: Not on file  Physical Activity: Not on file  Stress: Not on file  Social Connections: Not on file  Intimate Partner Violence: Not on file    Past Surgical History:  Procedure Laterality Date  . ABDOMINAL SURGERY      Family History  Problem Relation Age of Onset  . Cervical cancer Mother   . Alzheimer's disease Father     No Known Allergies  Current Outpatient Medications on File Prior to Visit  Medication Sig Dispense Refill  . Ascorbic Acid (VITAMIN C) 1000 MG  tablet Take 1,000 mg by mouth daily.    . B Complex-C (SUPER B COMPLEX PO) Take 1 tablet by mouth daily.    . Cholecalciferol (VITAMIN D3) 50 MCG (2000 UT) capsule Take 2,000 Units by mouth daily.    Marland Kitchen ibuprofen (ADVIL) 200 MG tablet Take 400 mg by mouth in the morning and at bedtime.    Marland Kitchen lisinopril (ZESTRIL) 10 MG tablet Take 1 tablet (10 mg total) by mouth daily. 90 tablet 1  . metoprolol succinate (TOPROL-XL) 50 MG 24 hr tablet Take 1 tablet (50 mg total) by mouth daily. Take with or immediately following a meal. 90 tablet 1  . Misc Natural Products (OSTEO BI-FLEX ADV TRIPLE ST PO) Take 1 tablet by mouth daily.    Marland Kitchen OVER THE  COUNTER MEDICATION Take 1 tablet by mouth daily. [place] MEGA MEN ENERGY AND METABOBLISM    . Turmeric Curcumin 500 MG CAPS Take 1 capsule by mouth daily.    . vitamin E 180 MG (400 UNITS) capsule Take 400 Units by mouth daily.     No current facility-administered medications on file prior to visit.    BP (!) 151/97   Pulse 72   Temp 98.7 F (37.1 C) (Oral)   Resp 20   Ht 5\' 10"  (1.778 m)   Wt 121 lb 9.6 oz (55.2 kg)   SpO2 98%   BMI 17.45 kg/m       Objective:   Physical Exam  General Mental Status- Alert. General Appearance- Not in acute distress.   Skin General: Color- Normal Color. Moisture- Normal Moisture.  Neck Carotid Arteries- Normal color. Moisture- Normal Moisture. No carotid bruits. No JVD.  Chest and Lung Exam Auscultation: Breath Sounds:-Normal.  Cardiovascular Auscultation:Rythm- Regular. Murmurs & Other Heart Sounds:Auscultation of the heart reveals- No Murmurs.  Abdomen Inspection:-Inspeection Normal. Palpation/Percussion:Note:No mass. Palpation and Percussion of the abdomen reveal- Non Tender, Non Distended + BS, no rebound or guarding.    Neurologic Cranial Nerve exam:- CN III-XII intact(No nystagmus), symmetric smile. Strength:- 5/5 equal and symmetric strength both upper and lower extremities.      Assessment & Plan:  History of hypertension and blood pressure is well controlled today.  I refilled your lisinopril 10 mg tablet daily and metoprolol 50 mg daily tablet.  Note I did write extended release version of metoprolol.  On review history of abnormal weight loss per records and recent loss of appetite.  I do not see prior colonoscopy done in the past.   I did place referral to gastroenterologist and they did call you. However you are having insurance issues recently. I did write a letter on your behalf asking HR to get you on the plan in light of the changed renewal of medical insurance process. Even if you do not get on plan I would  recommend that you proceed with work-up. Starting with a GI evaluation. If that study were to be negative then might consider CT imaging of the abdomen, pelvis and chest.  For fatigue and history of hypertension labs were drawn last visit. No significant abnormalities found.  Screening HIV and PSA were both negative.  Last labs did not show any blood in urine.  I will make copies of those labs today for your review.  History of belching intermittently and history of GERD but no obvious reflux reported presently.   Your bacterial breath test was negative. Will prescribe omeprazole to take daily in the event of reflux. Also will prescribe Zofran for your nausea  and vomiting. Hopefully your appetite will improve.  Please let me know if you are able to obtain health insurance. If you do not please go ahead and get scheduled with GI MD.  Follow-up in 2 to 3 weeks or as needed.  Time spent with patient today was 40  minutes which consisted of chart review, discussing diagnoses, loss of med insurance, work up treatment ,and documentation.

## 2020-05-11 NOTE — Patient Instructions (Addendum)
History of hypertension and blood pressure is well controlled today.  I refilled your lisinopril 10 mg tablet daily and metoprolol 50 mg daily tablet on last visit.  On review history of abnormal weight loss per records and recent loss of appetite.  I do not see prior colonoscopy done in the past.   I did place referral to gastroenterologist and they did call you. However you are having insurance issues recently. I did write a letter on your behalf asking HR to get you on the plan in light of the changed renewal of medical insurance process. Even if you do not get on plan I would recommend that you proceed with work-up. Starting with a GI evaluation. If that study were to be negative then might consider CT imaging of the abdomen, pelvis and chest.  For fatigue and history of hypertension labs were drawn last visit. No significant abnormalities found.  Screening HIV and PSA were both negative.  Last labs did not show any blood in urine.  I will make copies of those labs today for your review.  History of belching intermittently and history of GERD but no obvious reflux reported presently.   Your bacterial breath test was negative. Will prescribe omeprazole to take daily in the event of reflux. Also will prescribe Zofran for your nausea and vomiting. Hopefully your appetite will improve.  Please let me know if you are able to obtain health insurance. If you do not please go ahead and get scheduled with GI MD.  Follow-up in 2 to 3 weeks or as needed.

## 2020-05-25 ENCOUNTER — Ambulatory Visit: Payer: Self-pay | Admitting: Medical

## 2020-10-08 ENCOUNTER — Other Ambulatory Visit: Payer: Self-pay | Admitting: Medical

## 2020-10-20 ENCOUNTER — Encounter (HOSPITAL_BASED_OUTPATIENT_CLINIC_OR_DEPARTMENT_OTHER): Payer: Self-pay | Admitting: *Deleted

## 2020-10-20 ENCOUNTER — Inpatient Hospital Stay (HOSPITAL_BASED_OUTPATIENT_CLINIC_OR_DEPARTMENT_OTHER)
Admission: EM | Admit: 2020-10-20 | Discharge: 2020-10-24 | DRG: 378 | Disposition: A | Discharge status: Home / Self Care | Payer: Self-pay | Attending: Internal Medicine | Admitting: Internal Medicine

## 2020-10-20 ENCOUNTER — Other Ambulatory Visit: Payer: Self-pay

## 2020-10-20 DIAGNOSIS — E876 Hypokalemia: Secondary | ICD-10-CM | POA: Diagnosis present

## 2020-10-20 DIAGNOSIS — I10 Essential (primary) hypertension: Secondary | ICD-10-CM

## 2020-10-20 DIAGNOSIS — M47812 Spondylosis without myelopathy or radiculopathy, cervical region: Secondary | ICD-10-CM | POA: Diagnosis present

## 2020-10-20 DIAGNOSIS — D62 Acute posthemorrhagic anemia: Secondary | ICD-10-CM

## 2020-10-20 DIAGNOSIS — Z79899 Other long term (current) drug therapy: Secondary | ICD-10-CM

## 2020-10-20 DIAGNOSIS — F1090 Alcohol use, unspecified, uncomplicated: Secondary | ICD-10-CM

## 2020-10-20 DIAGNOSIS — Z87891 Personal history of nicotine dependence: Secondary | ICD-10-CM

## 2020-10-20 DIAGNOSIS — Z681 Body mass index (BMI) 19 or less, adult: Secondary | ICD-10-CM

## 2020-10-20 DIAGNOSIS — K2900 Acute gastritis without bleeding: Secondary | ICD-10-CM

## 2020-10-20 DIAGNOSIS — Z820 Family history of epilepsy and other diseases of the nervous system: Secondary | ICD-10-CM

## 2020-10-20 DIAGNOSIS — Z20822 Contact with and (suspected) exposure to covid-19: Secondary | ICD-10-CM | POA: Diagnosis present

## 2020-10-20 DIAGNOSIS — Z789 Other specified health status: Secondary | ICD-10-CM

## 2020-10-20 DIAGNOSIS — E440 Moderate protein-calorie malnutrition: Secondary | ICD-10-CM | POA: Insufficient documentation

## 2020-10-20 DIAGNOSIS — Z7289 Other problems related to lifestyle: Secondary | ICD-10-CM

## 2020-10-20 DIAGNOSIS — F1099 Alcohol use, unspecified with unspecified alcohol-induced disorder: Secondary | ICD-10-CM | POA: Diagnosis present

## 2020-10-20 DIAGNOSIS — K922 Gastrointestinal hemorrhage, unspecified: Secondary | ICD-10-CM | POA: Diagnosis present

## 2020-10-20 DIAGNOSIS — K250 Acute gastric ulcer with hemorrhage: Principal | ICD-10-CM

## 2020-10-20 LAB — COMPREHENSIVE METABOLIC PANEL
ALT: 21 U/L (ref 0–44)
AST: 32 U/L (ref 15–41)
Albumin: 3.8 g/dL (ref 3.5–5.0)
Alkaline Phosphatase: 52 U/L (ref 38–126)
Anion gap: 9 (ref 5–15)
BUN: 46 mg/dL — ABNORMAL HIGH (ref 8–23)
CO2: 28 mmol/L (ref 22–32)
Calcium: 8.7 mg/dL — ABNORMAL LOW (ref 8.9–10.3)
Chloride: 103 mmol/L (ref 98–111)
Creatinine, Ser: 0.66 mg/dL (ref 0.61–1.24)
GFR, Estimated: >60 mL/min (ref >60)
Glucose, Bld: 125 mg/dL — ABNORMAL HIGH (ref 70–99)
Potassium: 3.7 mmol/L (ref 3.5–5.1)
Sodium: 140 mmol/L (ref 135–145)
Total Bilirubin: 0.6 mg/dL (ref 0.3–1.2)
Total Protein: 6.9 g/dL (ref 6.5–8.1)

## 2020-10-20 LAB — URINALYSIS, ROUTINE W REFLEX MICROSCOPIC
Bilirubin Urine: NEGATIVE
Glucose, UA: NEGATIVE mg/dL
Hgb urine dipstick: NEGATIVE
Ketones, ur: NEGATIVE mg/dL
Leukocytes,Ua: NEGATIVE
Nitrite: NEGATIVE
Protein, ur: NEGATIVE mg/dL
Specific Gravity, Urine: <1.005 — ABNORMAL LOW (ref 1.005–1.030)
pH: 6.5 (ref 5.0–8.0)

## 2020-10-20 LAB — OCCULT BLOOD X 1 CARD TO LAB, STOOL: Fecal Occult Bld: POSITIVE — AB

## 2020-10-20 LAB — CBC
HCT: 30.4 % — ABNORMAL LOW (ref 39.0–52.0)
Hemoglobin: 10.4 g/dL — ABNORMAL LOW (ref 13.0–17.0)
MCH: 30.1 pg (ref 26.0–34.0)
MCHC: 34.2 g/dL (ref 30.0–36.0)
MCV: 88.1 fL (ref 80.0–100.0)
Platelets: 166 10*3/uL (ref 150–400)
RBC: 3.45 MIL/uL — ABNORMAL LOW (ref 4.22–5.81)
RDW: 13.9 % (ref 11.5–15.5)
WBC: 7.4 10*3/uL (ref 4.0–10.5)
nRBC: 0 % (ref 0.0–0.2)

## 2020-10-20 LAB — LIPASE, BLOOD: Lipase: 27 U/L (ref 11–51)

## 2020-10-20 MED ORDER — PANTOPRAZOLE SODIUM 40 MG IV SOLR
40.0000 mg | Freq: Once | INTRAVENOUS | Status: AC
  Administered 2020-10-20: 40 mg via INTRAVENOUS
  Filled 2020-10-20: qty 40

## 2020-10-20 MED ORDER — SODIUM CHLORIDE 0.9 % IV BOLUS
1000.0000 mL | Freq: Once | INTRAVENOUS | Status: AC
  Administered 2020-10-20: 1000 mL via INTRAVENOUS

## 2020-10-20 NOTE — ED Triage Notes (Signed)
C/o n/d  chills, generalized body aches and weakness x 3 days, neg covid test x 2

## 2020-10-20 NOTE — ED Provider Notes (Signed)
[name] Provider Note   CSN: [id number] Arrival date & time: 10/20/20  1905     History Chief Complaint  Patient presents with   Diarrhea    Nathaniel Williams is a 60 y.o. male.  The history is provided by the patient.  Diarrhea Nathaniel Williams is a 60 y.o. male who presents to the Emergency Department complaining of diarrhea.  He presents to the ED for evaluation of two days of diarrhea, up to 10 episodes today.  Stool is described as black.  Has been taking peptobismol(started taking today - black stools started yesterday).  Has associated chills, generalized weakness.  No fever, chest pain, abdominal pain, vomiting.  Has nausea.    Had mild cough and runny nose today.  No known sick contacts.  Tested Monday and Tuesday for covid and tested negative.  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     Out of blood pressure medicine (took last dose today).  Takes metoprolol 50 mg daily.      Past Medical History:  Diagnosis Date   Hypertension     Patient Active Problem List   Diagnosis Date Noted   GI bleed 10/21/2020    Past Surgical History:  Procedure Laterality Date   ABDOMINAL SURGERY         Family History  Problem Relation Age of Onset   Cervical cancer Mother    Alzheimer's disease Father     Social History   Tobacco Use   Smoking status: Former    Packs/day: 0.25    Years: 18.00    Pack years: 4.50    Types: Cigarettes   Smokeless tobacco: Never  Vaping Use   Vaping Use: Never used  Substance Use Topics   Alcohol use: Not Currently   Drug use: Never    Home Medications Prior to Admission medications   Medication Sig Start Date End Date Taking? Authorizing Provider  Ascorbic Acid (VITAMIN C) 1000 MG tablet Take 1,000 mg by mouth daily.    [provider]  B Complex-C (SUPER B COMPLEX PO) Take 1 tablet by mouth daily.    [provider]  Cholecalciferol  (VITAMIN D3) 50 MCG (2000 UT) capsule Take 2,000 Units by mouth daily.    [provider]  ibuprofen (ADVIL) 200 MG tablet Take 400 mg by mouth in the morning and at bedtime.    [provider]  lisinopril (ZESTRIL) 10 MG tablet Take 1 tablet (10 mg total) by mouth daily. 04/26/20 07/25/20  Saguier, Percell Miller, PA-C  metoprolol succinate (TOPROL-XL) 50 MG 24 hr tablet TAKE 1 TABLET BY MOUTH DAILY. TAKE WITH OR IMMEDIATELY FOLLOWING A MEAL. 10/08/20   Saguier, Percell Miller, PA-C  Misc Natural Products (OSTEO BI-FLEX ADV TRIPLE ST PO) Take 1 tablet by mouth daily.    [provider]  omeprazole (PRILOSEC) 20 MG capsule Take 1 capsule (20 mg total) by mouth daily. 05/11/20   Saguier, Percell Miller, PA-C  OVER THE COUNTER MEDICATION Take 1 tablet by mouth daily. [place] MEGA MEN ENERGY AND METABOBLISM    [provider]  Turmeric Curcumin 500 MG CAPS Take 1 capsule by mouth daily.    [provider]  vitamin E 180 MG (400 UNITS) capsule Take 400 Units by mouth daily.    [provider]    Allergies    Patient has no known allergies.  Review of Systems   Review of Systems  Gastrointestinal:  Positive for  diarrhea.  All other systems reviewed and are negative.  Physical Exam Updated Vital Signs BP (!) 151/103 (BP Location: Left Leg)   Pulse 71   Temp 98.5 F (36.9 C) (Oral)   Resp 18   Ht '5\' 11"'$  (1.803 m)   Wt 54.4 kg   SpO2 96%   BMI 16.74 kg/m   Physical Exam Vitals and nursing note reviewed.  Constitutional:      Appearance: He is well-developed.  HENT:     Head: Normocephalic and atraumatic.  Cardiovascular:     Rate and Rhythm: Normal rate and regular rhythm.     Heart sounds: No murmur heard. Pulmonary:     Effort: Pulmonary effort is normal. No respiratory distress.     Breath sounds: Normal breath sounds.  Abdominal:     Palpations: Abdomen is soft.     Tenderness: There is no guarding or rebound.     Comments: Mild lower abdominal  tenderness  Musculoskeletal:        General: No swelling or tenderness.  Skin:    General: Skin is warm and dry.  Neurological:     Mental Status: He is alert and oriented to person, place, and time.  Psychiatric:        Behavior: Behavior normal.    ED Results / Procedures / Treatments   Labs (all labs ordered are listed, but only abnormal results are displayed) Labs Reviewed  COMPREHENSIVE METABOLIC PANEL - Abnormal; Notable for the following components:      Result Value   Glucose, Bld 125 (*)    BUN 46 (*)    Calcium 8.7 (*)    All other components within normal limits  CBC - Abnormal; Notable for the following components:   RBC 3.45 (*)    Hemoglobin 10.4 (*)    HCT 30.4 (*)    All other components within normal limits  URINALYSIS, ROUTINE W REFLEX MICROSCOPIC - Abnormal; Notable for the following components:   Specific Gravity, Urine <1.005 (*)    All other components within normal limits  OCCULT BLOOD X 1 CARD TO LAB, STOOL - Abnormal; Notable for the following components:   Fecal Occult Bld POSITIVE (*)    All other components within normal limits  RESP PANEL BY RT-PCR (FLU A&B, COVID) ARPGX2  LIPASE, BLOOD  TYPE AND SCREEN    EKG None  Radiology No results found.  Procedures Procedures  CRITICAL CARE Performed by: Quintella Reichert   Total critical care time: 35 minutes  Critical care time was exclusive of separately billable procedures and treating other patients.  Critical care was necessary to treat or prevent imminent or life-threatening deterioration.  Critical care was time spent personally by me on the following activities: development of treatment plan with patient and/or surrogate as well as nursing, discussions with consultants, evaluation of patient's response to treatment, examination of patient, obtaining history from patient or surrogate, ordering and performing treatments and interventions, ordering and review of laboratory studies, ordering  and review of radiographic studies, pulse oximetry and re-evaluation of patient's condition.  Medications Ordered in ED Medications  sodium chloride 0.9 % bolus 1,000 mL (0 mLs Intravenous Stopped 10/21/20 0059)  pantoprazole (PROTONIX) injection 40 mg (40 mg Intravenous Given 10/20/20 2357)    ED Course  I have reviewed the triage vital signs and the nursing notes.  Pertinent labs & imaging results that were available during my care of the patient were reviewed by me and considered in my medical decision  making (see chart for details).    MDM Rules/Calculators/A&P                          patient here for evaluation of melancholic stools for two days with generalized weakness. Labs significant for anemia compared to prior as well as elevation and BUN compared to prior concerning for significant upper G.I. bleed. Patient was treated with Protonix, IV fluids. Discussed with patient findings of studies and recommendation for admission for ongoing treatment and he is in agreement with plan. Hospitalist consulted for admission.  Final Clinical Impression(s) / ED Diagnoses Final diagnoses:  Acute upper GI bleeding    Rx / DC Orders ED Discharge Orders     None        Quintella Reichert, MD 10/21/20 [phone]

## 2020-10-21 ENCOUNTER — Encounter (HOSPITAL_COMMUNITY)
Admission: EM | Disposition: A | Discharge status: Home / Self Care | Payer: Self-pay | Source: Home / Self Care | Attending: Internal Medicine

## 2020-10-21 ENCOUNTER — Encounter (HOSPITAL_COMMUNITY): Payer: Self-pay | Admitting: Family Medicine

## 2020-10-21 ENCOUNTER — Inpatient Hospital Stay (HOSPITAL_COMMUNITY): Payer: Self-pay | Admitting: Anesthesiology

## 2020-10-21 DIAGNOSIS — Z7289 Other problems related to lifestyle: Secondary | ICD-10-CM

## 2020-10-21 DIAGNOSIS — Z789 Other specified health status: Secondary | ICD-10-CM

## 2020-10-21 DIAGNOSIS — K2900 Acute gastritis without bleeding: Secondary | ICD-10-CM

## 2020-10-21 DIAGNOSIS — K2970 Gastritis, unspecified, without bleeding: Secondary | ICD-10-CM

## 2020-10-21 DIAGNOSIS — D62 Acute posthemorrhagic anemia: Secondary | ICD-10-CM

## 2020-10-21 DIAGNOSIS — K259 Gastric ulcer, unspecified as acute or chronic, without hemorrhage or perforation: Secondary | ICD-10-CM

## 2020-10-21 DIAGNOSIS — I10 Essential (primary) hypertension: Secondary | ICD-10-CM

## 2020-10-21 DIAGNOSIS — K250 Acute gastric ulcer with hemorrhage: Secondary | ICD-10-CM

## 2020-10-21 DIAGNOSIS — K922 Gastrointestinal hemorrhage, unspecified: Secondary | ICD-10-CM | POA: Diagnosis present

## 2020-10-21 HISTORY — PX: ESOPHAGOGASTRODUODENOSCOPY (EGD) WITH PROPOFOL: SHX5813

## 2020-10-21 HISTORY — PX: HEMOSTASIS CLIP PLACEMENT: SHX6857

## 2020-10-21 HISTORY — PX: BIOPSY: SHX5522

## 2020-10-21 LAB — HEMOGLOBIN AND HEMATOCRIT, BLOOD
HCT: 25.7 % — ABNORMAL LOW (ref 39.0–52.0)
HCT: 24 % — ABNORMAL LOW (ref 39.0–52.0)
Hemoglobin: 8.8 g/dL — ABNORMAL LOW (ref 13.0–17.0)
Hemoglobin: 8.2 g/dL — ABNORMAL LOW (ref 13.0–17.0)

## 2020-10-21 LAB — CBC
HCT: 25.9 % — ABNORMAL LOW (ref 39.0–52.0)
Hemoglobin: 8.5 g/dL — ABNORMAL LOW (ref 13.0–17.0)
MCH: 29.9 pg (ref 26.0–34.0)
MCHC: 32.8 g/dL (ref 30.0–36.0)
MCV: 91.2 fL (ref 80.0–100.0)
Platelets: 132 10*3/uL — ABNORMAL LOW (ref 150–400)
RBC: 2.84 MIL/uL — ABNORMAL LOW (ref 4.22–5.81)
RDW: 13.9 % (ref 11.5–15.5)
WBC: 4 10*3/uL (ref 4.0–10.5)
nRBC: 0 % (ref 0.0–0.2)

## 2020-10-21 LAB — RESP PANEL BY RT-PCR (FLU A&B, COVID) ARPGX2
Influenza A by PCR: NEGATIVE
Influenza B by PCR: NEGATIVE
SARS Coronavirus 2 by RT PCR: NEGATIVE

## 2020-10-21 LAB — ABO/RH: ABO/RH(D): O POS

## 2020-10-21 SURGERY — ESOPHAGOGASTRODUODENOSCOPY (EGD) WITH PROPOFOL
Anesthesia: Monitor Anesthesia Care

## 2020-10-21 MED ORDER — LORAZEPAM 1 MG PO TABS: 1.0000 mg | ORAL_TABLET | ORAL | Status: AC | PRN

## 2020-10-21 MED ORDER — FOLIC ACID 1 MG PO TABS
1.0000 mg | ORAL_TABLET | Freq: Every day | ORAL | Status: DC
  Administered 2020-10-21 – 2020-10-24 (×4): 1 mg via ORAL
  Filled 2020-10-21 (×4): qty 1

## 2020-10-21 MED ORDER — METOPROLOL TARTRATE 5 MG/5ML IV SOLN: 5.0000 mg | INTRAVENOUS | Status: DC | PRN

## 2020-10-21 MED ORDER — THIAMINE HCL 100 MG PO TABS
100.0000 mg | ORAL_TABLET | Freq: Every day | ORAL | Status: DC
  Administered 2020-10-21 – 2020-10-24 (×4): 100 mg via ORAL
  Filled 2020-10-21 (×4): qty 1

## 2020-10-21 MED ORDER — MORPHINE SULFATE (PF) 2 MG/ML IV SOLN: 2.0000 mg | INTRAVENOUS | Status: DC | PRN

## 2020-10-21 MED ORDER — PROSOURCE PLUS PO LIQD
30.0000 mL | Freq: Three times a day (TID) | ORAL | Status: DC
  Administered 2020-10-22 – 2020-10-24 (×8): 30 mL via ORAL
  Filled 2020-10-21 (×8): qty 30

## 2020-10-21 MED ORDER — PANTOPRAZOLE SODIUM 40 MG IV SOLR
40.0000 mg | Freq: Two times a day (BID) | INTRAVENOUS | Status: DC
  Administered 2020-10-21 – 2020-10-23 (×5): 40 mg via INTRAVENOUS
  Filled 2020-10-21 (×5): qty 40

## 2020-10-21 MED ORDER — LORAZEPAM 2 MG/ML IJ SOLN: 1.0000 mg | INTRAMUSCULAR | Status: AC | PRN

## 2020-10-21 MED ORDER — BOOST / RESOURCE BREEZE PO LIQD CUSTOM
1.0000 | Freq: Three times a day (TID) | ORAL | Status: DC
  Administered 2020-10-22 – 2020-10-24 (×8): 1 via ORAL

## 2020-10-21 MED ORDER — SODIUM CHLORIDE 0.9 % IV SOLN: INTRAVENOUS | Status: DC

## 2020-10-21 MED ORDER — ADULT MULTIVITAMIN W/MINERALS CH
1.0000 | ORAL_TABLET | Freq: Every day | ORAL | Status: DC
  Administered 2020-10-21 – 2020-10-24 (×4): 1 via ORAL
  Filled 2020-10-21 (×4): qty 1

## 2020-10-21 MED ORDER — OXYCODONE HCL 5 MG PO TABS
5.0000 mg | ORAL_TABLET | ORAL | Status: DC | PRN
  Administered 2020-10-21 – 2020-10-24 (×10): 5 mg via ORAL
  Filled 2020-10-21 (×11): qty 1

## 2020-10-21 MED ORDER — PROPOFOL 10 MG/ML IV BOLUS
INTRAVENOUS | Status: DC | PRN
  Administered 2020-10-21 (×4): 20 mg via INTRAVENOUS

## 2020-10-21 MED ORDER — SODIUM CHLORIDE 0.9 % IV SOLN: INTRAVENOUS | Status: AC

## 2020-10-21 MED ORDER — THIAMINE HCL 100 MG/ML IJ SOLN: 100.0000 mg | Freq: Every day | INTRAMUSCULAR | Status: DC

## 2020-10-21 MED ORDER — PROPOFOL 500 MG/50ML IV EMUL
INTRAVENOUS | Status: DC | PRN
  Administered 2020-10-21: 125 ug/kg/min via INTRAVENOUS

## 2020-10-21 MED ORDER — LIDOCAINE 2% (20 MG/ML) 5 ML SYRINGE
INTRAMUSCULAR | Status: DC | PRN
  Administered 2020-10-21: 60 mg via INTRAVENOUS

## 2020-10-21 MED ORDER — HYDRALAZINE HCL 20 MG/ML IJ SOLN
10.0000 mg | INTRAMUSCULAR | Status: DC | PRN
  Administered 2020-10-23: 10 mg via INTRAVENOUS
  Filled 2020-10-21: qty 1

## 2020-10-21 MED ORDER — SENNOSIDES-DOCUSATE SODIUM 8.6-50 MG PO TABS: 1.0000 | ORAL_TABLET | Freq: Every evening | ORAL | Status: DC | PRN

## 2020-10-21 SURGICAL SUPPLY — 14 items

## 2020-10-21 NOTE — Anesthesia Preprocedure Evaluation (Addendum)
Anesthesia Evaluation  Patient identified by MRN, date of birth, ID band Patient awake    Reviewed: Allergy & Precautions, NPO status , Patient's Chart, lab work & pertinent test results, reviewed documented beta blocker date and time   Airway Mallampati: III  TM Distance: >3 FB Neck ROM: Full    Dental no notable dental hx. (+) Teeth Intact, Dental Advisory Given   Pulmonary neg pulmonary ROS, former smoker,    Pulmonary exam normal breath sounds clear to auscultation       Cardiovascular hypertension, Pt. on home beta blockers and Pt. on medications Normal cardiovascular exam Rhythm:Regular Rate:Normal     Neuro/Psych negative neurological ROS  negative psych ROS   GI/Hepatic negative GI ROS, (+)     substance abuse  alcohol use,   Endo/Other  negative endocrine ROS  Renal/GU negative Renal ROS  negative genitourinary   Musculoskeletal negative musculoskeletal ROS (+)   Abdominal   Peds  Hematology  (+) Blood dyscrasia (Hgb 8.2), anemia ,   Anesthesia Other Findings EGD for melena, blood loss anemia  60 y.o. male with medical history significant of hypertension presented to the ED with 2-day history of multiple episodes of black-colored diarrhea and generalized weakness  Reproductive/Obstetrics                          Anesthesia Physical Anesthesia Plan  ASA: 3  Anesthesia Plan: MAC   Post-op Pain Management:    Induction: Intravenous  PONV Risk Score and Plan: 1 and Propofol infusion and Treatment may vary due to age or medical condition  Airway Management Planned: Natural Airway  Additional Equipment:   Intra-op Plan:   Post-operative Plan:   Informed Consent: I have reviewed the patients History and Physical, chart, labs and discussed the procedure including the risks, benefits and alternatives for the proposed anesthesia with the patient or authorized representative who  has indicated his/her understanding and acceptance.     Dental advisory given  Plan Discussed with: CRNA  Anesthesia Plan Comments:         Anesthesia Quick Evaluation

## 2020-10-21 NOTE — Anesthesia Postprocedure Evaluation (Signed)
Anesthesia Post Note  Patient: Dacen Gillespie  Procedure(s) Performed: ESOPHAGOGASTRODUODENOSCOPY (EGD) WITH PROPOFOL HEMOSTASIS CLIP PLACEMENT BIOPSY     Patient location during evaluation: Endoscopy Anesthesia Type: MAC Level of consciousness: awake and alert Pain management: pain level controlled Vital Signs Assessment: post-procedure vital signs reviewed and stable Respiratory status: spontaneous breathing, nonlabored ventilation, respiratory function stable and patient connected to nasal cannula oxygen Cardiovascular status: blood pressure returned to baseline and stable Postop Assessment: no apparent nausea or vomiting Anesthetic complications: no   No notable events documented.  Last Vitals:  Vitals:   10/21/20 1540 10/21/20 1550  BP:    Pulse: 76 74  Resp:    Temp:    SpO2: 98% 100%    Last Pain:  Vitals:   10/21/20 1550  TempSrc:   PainSc: 0-No pain                 Solange Emry L Crisol Muecke

## 2020-10-21 NOTE — Plan of Care (Signed)
  Problem: Education: Goal: Knowledge of General Education information will improve Description: Including pain rating scale, medication(s)/side effects and non-pharmacologic comfort measures Outcome: Progressing   Problem: Health Behavior/Discharge Planning: Goal: Ability to manage health-related needs will improve Outcome: Progressing   Problem: Clinical Measurements: Goal: Ability to maintain clinical measurements within normal limits will improve Outcome: Progressing   Problem: Coping: Goal: Level of anxiety will decrease Outcome: Progressing   Problem: Elimination: Goal: Will not experience complications related to bowel motility Outcome: Progressing   Problem: Safety: Goal: Ability to remain free from injury will improve Outcome: Progressing   Problem: Skin Integrity: Goal: Risk for impaired skin integrity will decrease Outcome: Progressing   

## 2020-10-21 NOTE — H&P (Signed)
History and Physical    Nathaniel Williams OH:[id number] DOB: 1960-04-13 DOA: 10/20/2020  PCP: Mackie Pai, PA-C Patient coming from: [facility]  Chief Complaint: Diarrhea  HPI: Nathaniel Williams is a 60 y.o. male with medical history significant of hypertension presented to the ED with 2-day history of multiple episodes of black-colored diarrhea and generalized weakness.  In the ED, hemodynamically stable.  Labs showing hemoglobin 10.4, was 14.4 on labs done 6 months ago.  BUN 46.  FOBT positive.  COVID and influenza PCR negative. Patient was given IV Protonix 40 mg and 1 L normal saline bolus.  Patient reports 3-day history of multiple episodes of black-colored diarrhea.  He had 10 episodes at home yesterday and another 2 episodes in the ED.  Denies hematemesis or abdominal pain.  Reports lightheadedness, dyspnea on exertion, and fatigue.  Denies chest pain.  States he tested negative for COVID twice at his workplace since his symptoms started.  He drinks 4 shots of Bacardi rum every other day.  Also takes ibuprofen 400 mg 4 times a day for chronic neck and back pain.  Reports history of esophagus repair surgery twice for difficulty swallowing back in the 1980s and 1990s.  Review of Systems:  All systems reviewed and apart from history of presenting illness, are negative.  Past Medical History:  Diagnosis Date   Hypertension     Past Surgical History:  Procedure Laterality Date   ABDOMINAL SURGERY       reports that he has quit smoking. His smoking use included cigarettes. He has a 4.50 pack-year smoking history. He has never used smokeless tobacco. He reports previous alcohol use. He reports that he does not use drugs.  No Known Allergies  Family History  Problem Relation Age of Onset   Cervical cancer Mother    Alzheimer's disease Father     Prior to Admission medications   Medication Sig Start Date End Date Taking? Authorizing Provider  Ascorbic Acid (VITAMIN C) 1000 MG tablet  Take 1,000 mg by mouth daily.    [provider]  B Complex-C (SUPER B COMPLEX PO) Take 1 tablet by mouth daily.    [provider]  Cholecalciferol (VITAMIN D3) 50 MCG (2000 UT) capsule Take 2,000 Units by mouth daily.    [provider]  ibuprofen (ADVIL) 200 MG tablet Take 400 mg by mouth in the morning and at bedtime.    [provider]  lisinopril (ZESTRIL) 10 MG tablet Take 1 tablet (10 mg total) by mouth daily. 04/26/20 07/25/20  Saguier, Percell Miller, PA-C  metoprolol succinate (TOPROL-XL) 50 MG 24 hr tablet TAKE 1 TABLET BY MOUTH DAILY. TAKE WITH OR IMMEDIATELY FOLLOWING A MEAL. 10/08/20   Saguier, Percell Miller, PA-C  Misc Natural Products (OSTEO BI-FLEX ADV TRIPLE ST PO) Take 1 tablet by mouth daily.    [provider]  omeprazole (PRILOSEC) 20 MG capsule Take 1 capsule (20 mg total) by mouth daily. 05/11/20   Saguier, Percell Miller, PA-C  OVER THE COUNTER MEDICATION Take 1 tablet by mouth daily. [place] MEGA MEN ENERGY AND METABOBLISM    [provider]  Turmeric Curcumin 500 MG CAPS Take 1 capsule by mouth daily.    [provider]  vitamin E 180 MG (400 UNITS) capsule Take 400 Units by mouth daily.    [provider]    Physical Exam: Vitals:   10/20/20 2300 10/21/20 0000 10/21/20 0100 10/21/20 0228  BP: 105/80 (!) 148/91 (!) 150/91 (!) 151/103  Pulse: (!) 106 92 84  71  Resp: '16 16 16 18  '$ Temp:    98.5 F (36.9 C)  TempSrc:    Oral  SpO2: 100% 100% 100% 96%  Weight:      Height:        Physical Exam Constitutional:      General: He is not in acute distress. HENT:     Head: Normocephalic and atraumatic.  Eyes:     Extraocular Movements: Extraocular movements intact.     Conjunctiva/sclera: Conjunctivae normal.  Cardiovascular:     Rate and Rhythm: Normal rate and regular rhythm.     Pulses: Normal pulses.  Pulmonary:     Effort: Pulmonary effort is normal. No respiratory distress.     Breath sounds: Normal breath sounds.  No wheezing or rales.  Abdominal:     General: Bowel sounds are normal. There is no distension.     Palpations: Abdomen is soft.     Tenderness: There is no abdominal tenderness. There is no guarding or rebound.  Musculoskeletal:        General: No swelling or tenderness.     Cervical back: Normal range of motion and neck supple.  Skin:    General: Skin is warm and dry.  Neurological:     General: No focal deficit present.     Mental Status: He is alert and oriented to person, place, and time.     Labs on Admission: I have personally reviewed following labs and imaging studies  CBC: Recent Labs  Lab 10/20/20 2007  WBC 7.4  HGB 10.4*  HCT 30.4*  MCV 88.1  PLT [id number]   Basic Metabolic Panel: Recent Labs  Lab 10/20/20 2007  NA 140  K 3.7  CL 103  CO2 28  GLUCOSE 125*  BUN 46*  CREATININE 0.66  CALCIUM 8.7*   GFR: Estimated Creatinine Clearance: 74.6 mL/min (by C-G formula based on SCr of 0.66 mg/dL). Liver Function Tests: Recent Labs  Lab 10/20/20 2007  AST 32  ALT 21  ALKPHOS 52  BILITOT 0.6  PROT 6.9  ALBUMIN 3.8   Recent Labs  Lab 10/20/20 2007  LIPASE 27   No results for input(s): AMMONIA in the last 168 hours. Coagulation Profile: No results for input(s): INR, PROTIME in the last 168 hours. Cardiac Enzymes: No results for input(s): CKTOTAL, CKMB, CKMBINDEX, TROPONINI in the last 168 hours. BNP (last 3 results) No results for input(s): PROBNP in the last 8760 hours. HbA1C: No results for input(s): HGBA1C in the last 72 hours. CBG: No results for input(s): GLUCAP in the last 168 hours. Lipid Profile: No results for input(s): CHOL, HDL, LDLCALC, TRIG, CHOLHDL, LDLDIRECT in the last 72 hours. Thyroid Function Tests: No results for input(s): TSH, T4TOTAL, FREET4, T3FREE, THYROIDAB in the last 72 hours. Anemia Panel: No results for input(s): VITAMINB12, FOLATE, FERRITIN, TIBC, IRON, RETICCTPCT in the last 72 hours. Urine analysis:    Component  Value Date/Time   COLORURINE YELLOW 10/20/2020 1937   APPEARANCEUR CLEAR 10/20/2020 1937   LABSPEC <1.005 (L) 10/20/2020 1937   PHURINE 6.5 10/20/2020 1937   GLUCOSEU NEGATIVE 10/20/2020 1937   HGBUR NEGATIVE 10/20/2020 1937   BILIRUBINUR NEGATIVE 10/20/2020 1937   BILIRUBINUR 1 04/26/2020 1034   KETONESUR NEGATIVE 10/20/2020 1937   PROTEINUR NEGATIVE 10/20/2020 1937   UROBILINOGEN 1.0 04/26/2020 1034   NITRITE NEGATIVE 10/20/2020 1937   LEUKOCYTESUR NEGATIVE 10/20/2020 1937    Radiological Exams on Admission: No results found.  Assessment/Plan Principal Problem:   GI bleed  Active Problems:   Acute blood loss anemia   Alcohol use   Essential hypertension   Acute upper GI bleed Symptomatic acute blood loss anemia Patient is presenting with a 3-day history of multiple episodes of melena.  Differential includes gastritis and PUD.  He reports regular alcohol and NSAID use.  He is hemodynamically stable.  Hemoglobin was 14.4 six months ago and now down to 10.4.  BUN elevated.  FOBT positive. -Continue IV PPI and IV fluids.  Serial H&H.  Type and screen, transfuse PRBCs if hemoglobin continues to drop on repeat labs.  Keep n.p.o. Will need EGD, I have sent a message to on-call physician for [facility] requesting consultation in the morning.  If any signs of hemodynamically instability, GI will be urgently consulted overnight.  Alcohol use disorder No signs of withdrawal at this time. -CIWA protocol; Ativan as needed.  Thiamine, folate, and multivitamin.  Check mag and Phos levels.  Hypertension Stable. -Hold antihypertensives at this time in the setting of acute GI bleed  DVT prophylaxis: SCDs Code Status: Full code Family Communication: No family available at this time. Disposition Plan: Status is: Inpatient  Remains inpatient appropriate because:Ongoing diagnostic testing needed not appropriate for outpatient work up and Inpatient level of care appropriate due to severity of  illness  Dispo: The patient is from: Home              Anticipated d/c is to: Home              Patient currently is not medically stable to d/c.   Difficult to place patient No  Level of care: Level of care: Telemetry Medical  The medical decision making on this patient was of high complexity and the patient is at high risk for clinical deterioration, therefore this is a level 3 visit.  Shela Leff MD [facility]  If 7PM-7AM, please contact night-coverage [email or web address]  10/21/2020, 5:42 AM

## 2020-10-21 NOTE — Progress Notes (Signed)
60 year old with history of HTN admitted for melanotic stool.  Baseline hemoglobin 14.4, down to 10.4 upon admission, FOBT positive.  Started on PPI, IV fluids.  GI team consulted.  Seen and examined at bedside. Doing ok. His neck pain has been chronic for which he follows at [facility] orthopedic. Has had MRI of his shoulder and neck done. He has been using NSAIDs for the past 2-3 yr, takes atleast 3-4 tabs daily.   Vital signs are stable Constitutional: Not in acute distress Respiratory: Clear to auscultation bilaterally Cardiovascular: Normal sinus rhythm, no rubs Abdomen: Nontender nondistended good bowel sounds Musculoskeletal: No edema noted Skin: No rashes seen Neurologic: CN 2-12 grossly intact.  And nonfocal Psychiatric: Normal judgment and insight. Alert and oriented x 3. Normal mood.    Acute upper GI bleed/symptomatic acute blood loss anemia-suspect upper GI bleed from alcohol and NSAID use.  PPI IV, IV fluids.  GI consulted, will likely require EGD.  Alcohol use-withdrawal protocol  Essential hypertension-Home meds on hold  Cervical Spondylosis with foraminal narrowing- Pain control. Follow up with outpatient Ortho at [facility]. Currently undergoing conservative management.    Gerlean Ren MD [facility]

## 2020-10-21 NOTE — Progress Notes (Signed)
[facility] notified that patient had arrived to the unit [phone]

## 2020-10-21 NOTE — Progress Notes (Signed)
[facility] notified having 7/10 neck pain and admit with GI bleed and NPO. Inquired if airborne contact precautions be discontinued COVID - and respiratory is -. Arthor Captain LPN

## 2020-10-21 NOTE — Progress Notes (Signed)
Pt. Left for EGC, 2 rings, 2 bracelets, 1 watch, and cell phone with charger placed in plastic bag  with label in top drawer of the pt. Night stand in room.

## 2020-10-21 NOTE — Consult Note (Signed)
[facility] Consult: 8:15 AM 10/21/2020  LOS: 0 days    Referring Provider: Dr Reesa Chew  Primary Care Physician:  Mackie Pai, PA-C Primary Gastroenterologist:  Dr Aurelio Brash   Reason for Consultation:  melena, anemia/Hgb decline.     HPI: Nathaniel Williams is a 60 y.o. male.  Hx htn.   L frozen shoulder.  C-spine disease with radiculopathy.  Scrotal hydroceles.  Iron deficiency anemia in 2020.  Diagnosis of achalasia 1996 in [place], s/p Heller myotomy w gastric pull-through.  Despite myotomy had recurrent aspiration pneumonia.   07/2008 EGD: Postsurgical anatomy, biopsies neg for Barrett's.   07/2008 colonoscopy.  Study normal.  There was some confusion as to maternal and grandmother's history of colon cancer but it was actually cervical cancer. 10/2015 GI eval for anorexia, early satiety, weight loss.  10/2015 barium swallow with lodging of tablet at neo-EG junction C/W stenosis. 10/2015 EGD: Report not found patient may have undergone esophageal dilatation.  Pathology of duodenum was benign gastric biopsies showed mild chronic gastritis, no H. pylori.  Esophageal biopsies unremarkable, no evidence eosinophilic esophagitis.  Patient takes 800 mg ibuprofen daily and omeprazole 20 mg daily for a long time.  Appetite is variable.  Periods of weight loss but nothing recently.  Normally has brown stools.  Starting Monday he developed multiple episodes of soft, tarry, unformed stools.  There was nausea, no emesis.  Acute anorexia.  No abdominal pain.  Positive dizziness, no syncope.  No chest pain.  Mild DOE.  Came to the ED last night  BPs w diastolic htn.  HR 70s to 106.  Excellent RA sats.   Hgb 10.4 >> 8.8.  Was 14.4 six months ago.  MCV 88.  BUN 46, creat and GFR wnl.  LFTs WNL.   No coags.   PPI drip initiated    Patient lives alone.  Consumes 1.5 L of rum over the course of 10 days.  Not a smoker.    Past Medical History:  Diagnosis Date   Hypertension     Past Surgical History:  Procedure Laterality Date   ABDOMINAL SURGERY      Prior to Admission medications   Medication Sig Start Date End Date Taking? Authorizing Provider  Ascorbic Acid (VITAMIN C) 1000 MG tablet Take 1,000 mg by mouth daily.    [provider]  B Complex-C (SUPER B COMPLEX PO) Take 1 tablet by mouth daily.    [provider]  Cholecalciferol (VITAMIN D3) 50 MCG (2000 UT) capsule Take 2,000 Units by mouth daily.    [provider]  ibuprofen (ADVIL) 200 MG tablet Take 400 mg by mouth in the morning and at bedtime.    [provider]  lisinopril (ZESTRIL) 10 MG tablet Take 1 tablet (10 mg total) by mouth daily. 04/26/20 07/25/20  Saguier, Percell Miller, PA-C  metoprolol succinate (TOPROL-XL) 50 MG 24 hr tablet TAKE 1 TABLET BY MOUTH DAILY. TAKE WITH OR IMMEDIATELY FOLLOWING A MEAL. 10/08/20   Saguier, Percell Miller, PA-C  Misc Natural Products (OSTEO BI-FLEX ADV TRIPLE ST PO) Take 1  tablet by mouth daily.    [provider]  omeprazole (PRILOSEC) 20 MG capsule Take 1 capsule (20 mg total) by mouth daily. 05/11/20   Saguier, Percell Miller, PA-C  OVER THE COUNTER MEDICATION Take 1 tablet by mouth daily. [place] MEGA MEN ENERGY AND METABOBLISM    [provider]  Turmeric Curcumin 500 MG CAPS Take 1 capsule by mouth daily.    [provider]  vitamin E 180 MG (400 UNITS) capsule Take 400 Units by mouth daily.    [provider]    Scheduled Meds:  folic acid  1 mg Oral Daily   multivitamin with minerals  1 tablet Oral Daily   pantoprazole (PROTONIX) IV  40 mg Intravenous Q12H   thiamine  100 mg Oral Daily   Or   thiamine  100 mg Intravenous Daily   Infusions:  sodium chloride 125 mL/hr at 10/21/20 0616   PRN Meds: LORazepam **OR** LORazepam   Allergies as of 10/20/2020    (No Known Allergies)    Family History  Problem Relation Age of Onset   Cervical cancer Mother    Alzheimer's disease Father     Social History   Socioeconomic History   Marital status: Legally Separated    Spouse name: Not on file   Number of children: Not on file   Years of education: Not on file   Highest education level: Not on file  Occupational History   Not on file  Tobacco Use   Smoking status: Former    Packs/day: 0.25    Years: 18.00    Pack years: 4.50    Types: Cigarettes   Smokeless tobacco: Never  Vaping Use   Vaping Use: Never used  Substance and Sexual Activity   Alcohol use: Not Currently   Drug use: Never   Sexual activity: Not on file  Other Topics Concern   Not on file  Social History Narrative   Not on file   Social Determinants of Health   Financial Resource Strain: Not on file  Food Insecurity: Not on file  Transportation Needs: Not on file  Physical Activity: Not on file  Stress: Not on file  Social Connections: Not on file  Intimate Partner Violence: Not on file    REVIEW OF SYSTEMS: Constitutional: Weakness ENT:  No nose bleeds Pulm: Mild DOE.  No cough CV:  No palpitations, no LE edema.  No angina. GU:  No hematuria, no frequency GI: See HPI.  No history of abnormal LFTs. Heme: Other than the tarry stools, no unusual bleeding or bruising.  Took iron some years ago but nothing recently. Transfusions: Has never undergone transfusions. Neuro: Positive dizziness without syncope.  No seizures.  No headaches, no peripheral tingling or numbness Derm:  No itching, no rash or sores.  Endocrine:  No sweats or chills.  No polyuria or dysuria Immunization: Vaccinated and boosted for COVID-19.   PHYSICAL EXAM: Vital signs in last 24 hours: Vitals:   10/21/20 0228 10/21/20 0620  BP: (!) 151/103 (!) 137/92  Pulse: 71 88  Resp: 18 18  Temp: 98.5 F (36.9 C) 98.9 F (37.2 C)  SpO2: 96% 100%   Wt Readings from Last 3 Encounters:   10/20/20 54.4 kg  05/11/20 55.2 kg  04/26/20 55.1 kg    General: Patient is thin and looks somewhat unwell but comfortable and alert Head: No facial asymmetry or swelling.  No signs of head trauma. Eyes: No conjunctival pallor.  EOMI Ears: Not hard of  hearing Nose: No congestion or discharge Mouth: Good dentition.  Tongue midline.  Mucosa moist, pink, clear. Neck: No JVD, no masses, no thyromegaly Lungs: Diminished breath sounds on the right base, otherwise clear.  No labored breathing or cough. Heart: RRR.  No MRG.  S1, S2 present. Abdomen: Soft, thin, nontender.  Long, well-healed midline scar from his myotomy.  No HSM, masses.  Intermittent bruit in the left mid abdomen..   Rectal: Deferred.  Stool submitted to lab was FOBT positive.   Musc/Skeltl: No joint redness, swelling or gross deformities. Extremities: No CCE. Neurologic: Alert.  Oriented x3.  No gross weakness or deficits.  No tremors. Skin: No rash, no sores, no significant purpura or bruising. Nodes: No cervical adenopathy. Psych: Calm, cooperative, fluid speech.  Intake/Output from previous day: 08/03 0701 - 08/04 0700 In: 1000 [IV Piggyback:1000] Out: -  Intake/Output this shift: No intake/output data recorded.  LAB RESULTS: Recent Labs    10/20/20 2007 10/21/20 0532  WBC 7.4  --   HGB 10.4* 8.8*  HCT 30.4* 25.7*  PLT 166  --    BMET Lab Results  Component Value Date   NA 140 10/20/2020   NA 138 04/26/2020   NA 139 02/13/2020   K 3.7 10/20/2020   K 3.5 04/26/2020   K 3.2 (L) 02/13/2020   CL 103 10/20/2020   CL 100 04/26/2020   CL 102 02/13/2020   CO2 28 10/20/2020   CO2 31 04/26/2020   CO2 28 02/13/2020   GLUCOSE 125 (H) 10/20/2020   GLUCOSE 102 (H) 04/26/2020   GLUCOSE 102 (H) 02/13/2020   BUN 46 (H) 10/20/2020   BUN 9 04/26/2020   BUN 18 02/13/2020   CREATININE 0.66 10/20/2020   CREATININE 0.97 04/26/2020   CREATININE 0.99 02/13/2020   CALCIUM 8.7 (L) 10/20/2020   CALCIUM 9.6  04/26/2020   CALCIUM 9.0 02/13/2020   LFT Recent Labs    10/20/20 2007  PROT 6.9  ALBUMIN 3.8  AST 32  ALT 21  ALKPHOS 52  BILITOT 0.6   PT/INR No results found for: INR, PROTIME Hepatitis Panel No results for input(s): HEPBSAG, HCVAB, HEPAIGM, HEPBIGM in the last 72 hours. C-Diff No components found for: CDIFF Lipase     Component Value Date/Time   LIPASE 27 10/20/2020 2007    Drugs of Abuse  No results found for: LABOPIA, COCAINSCRNUR, LABBENZ, AMPHETMU, THCU, LABBARB   RADIOLOGY STUDIES: No results found.   IMPRESSION:      UGIB    Blood loss anemia.  Hx IDA but not on iron recently and MCV wnl.      Hx achalasia.  S/p 1996 myotomy, gastric pull through.  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       Unremrkable 2010 colonoscopy.  Heavy EtOH consumption, 1.5 L rum/10 days.  LFTs normal.  No history of problems with the liver.    PLAN:     EGD sometime today.  Hgb at noon.  CBC every 12 hours.   Azucena Freed  10/21/2020, 8:15 AM Phone [phone]

## 2020-10-21 NOTE — Progress Notes (Signed)
Initial Nutrition Assessment  DOCUMENTATION CODES:   Non-severe (moderate) malnutrition in context of chronic illness, Underweight  INTERVENTION:   -Once diet is advanced add:  -Boost Breeze po TID, each supplement provides 250 kcal and 9 grams of protein  -30 ml Prosource Plus TID, each supplement provides 100 kcals and 15 grams protein -MVI with minerals daily  NUTRITION DIAGNOSIS:   Moderate Malnutrition related to chronic illness (ETOH abuse) as evidenced by mild fat depletion, moderate fat depletion, mild muscle depletion, moderate muscle depletion.  GOAL:   Patient will meet greater than or equal to 90% of their needs  MONITOR:   PO intake, Supplement acceptance, Diet advancement, Labs, Weight trends, Skin, I & O's  REASON FOR ASSESSMENT:   Malnutrition Screening Tool    ASSESSMENT:   Nathaniel Williams is a 60 y.o. male with medical history significant of hypertension presented to the ED with 2-day history of multiple episodes of black-colored diarrhea and generalized weakness.  In the ED, hemodynamically stable.  Labs showing hemoglobin 10.4, was 14.4 on labs done 6 months ago.  BUN 46.  FOBT positive.  COVID and influenza PCR negative. Patient was given IV Protonix 40 mg and 1 L normal saline bolus.  Pt admitted with upper GIB and symptomatic anemia.   Reviewed I/O's: +1 L x 24 hours  Attempted to speak with pt x 2. At first attempt, pt receiving nursing care. Caregiver assisting with personal care at time of second attempt and requested RD come back at a later time. RD provided pt with a surgical mask per his request.   Per GI notes, pt with variable appetite at baseline. He has a distant history of weight loss, but wt has been stable recently. Plan for EGD today.   Reviewed wt hx; wt has been stable over the past year.    Medications reviewed and include folic acid, thiamine, and 0.9% sodium chloride infusion @ 125 ml/hr.   Labs reviewed.    NUTRITION -  FOCUSED PHYSICAL EXAM:  Flowsheet Row Most Recent Value  Orbital Region Moderate depletion  Upper Arm Region Moderate depletion  Thoracic and Lumbar Region Unable to assess  Buccal Region Mild depletion  Temple Region Mild depletion  Clavicle Bone Region Mild depletion  Clavicle and Acromion Bone Region Unable to assess  Scapular Bone Region Unable to assess  Dorsal Hand Moderate depletion  Patellar Region Moderate depletion  Anterior Thigh Region Moderate depletion  Posterior Calf Region Moderate depletion  Edema (RD Assessment) None  Hair Reviewed  Eyes Reviewed  Mouth Reviewed  Skin Reviewed  Nails Reviewed       Diet Order:   Diet Order             Diet NPO time specified  Diet effective now                   EDUCATION NEEDS:   No education needs have been identified at this time  Skin:  Skin Assessment: Reviewed RN Assessment  Last BM:  10/21/20  Height:   Ht Readings from Last 1 Encounters:  10/20/20 '5\' 11"'$  (1.803 m)    Weight:   Wt Readings from Last 1 Encounters:  10/20/20 54.4 kg    Ideal Body Weight:  78.2 kg  BMI:  Body mass index is 16.74 kg/m.  Estimated Nutritional Needs:   Kcal:  2000-2200  Protein:  105-120 grams  Fluid:  > 2 L    Loistine Chance, RD, LDN, [place] Registered Dietitian II  Certified Diabetes Care and Education Specialist Please refer to [facility] for RD and/or RD on-call/weekend/after hours pager

## 2020-10-21 NOTE — Op Note (Signed)
[facility] Patient Name: Nathaniel Williams Procedure Date : 10/21/2020 MRN: [id number] Attending MD: Jerene Bears , MD Date of Birth: Oct 02, 1960 CSN: [id number] Age: 60 Admit Type: Inpatient Procedure:                Upper GI endoscopy Indications:              Acute post hemorrhagic anemia, Melena Providers:                Lajuan Lines. Hilarie Fredrickson, MD, Particia Nearing, RN, Elspeth Cho                            Tech., Technician Referring MD:             [facility] Medicines:                Monitored Anesthesia Care Complications:            No immediate complications. Estimated Blood Loss:     Estimated blood loss: none. Procedure:                Pre-Anesthesia Assessment:                           - Prior to the procedure, a History and Physical                            was performed, and patient medications and                            allergies were reviewed. The patient's tolerance of                            previous anesthesia was also reviewed. The risks                            and benefits of the procedure and the sedation                            options and risks were discussed with the patient.                            All questions were answered, and informed consent                            was obtained. Prior Anticoagulants: The patient has                            taken no previous anticoagulant or antiplatelet                            agents. ASA Grade Assessment: III - A patient with                            severe systemic disease. After reviewing the risks  and benefits, the patient was deemed in                            satisfactory condition to undergo the procedure.                           After obtaining informed consent, the endoscope was                            passed under direct vision. Throughout the                            procedure, the patient's blood pressure, pulse, and                             oxygen saturations were monitored continuously. The                            [id number] Olympus endoscope was introduced                            through the mouth, and advanced to the second part                            of duodenum. The upper GI endoscopy was                            accomplished without difficulty. The patient                            tolerated the procedure well. Scope In: Scope Out: Findings:      An esophago-gastric anastomosis was found in the upper third of the       esophagus at 22 cm from the incisors. The anastomosis was characterized       by a slightly irregular Z-line but otherwise healthy appearing mucosa.      One non-bleeding cratered gastric ulcer with a small pigmented spot at       the lateral margin was found in the prepyloric region of the stomach.       The lesion was 10 mm in largest dimension. Attempt made to place one       hemostatic clip was unsuccessful as the clip did not stay seated.       Another attempt was not made today as there was no active bleeding and I       did not want to further traumatize the ulcer. There was no bleeding at       the end of the maneuver.      Diffuse mild inflammation characterized by congestion (edema) and       erythema was found in the gastric body and in the gastric antrum.       Biopsies were taken with a cold forceps for histology and Helicobacter       pylori testing.      The examined duodenum was normal. Impression:               - An esophago-gastric anastomosis was  found at 22                            cm (previous subtotal esophagectomy with gastric                            pull-through. Z-line irregular.                           - Non-bleeding gastric ulcer with small pigmented                            spot. Clip (MR conditional) placement unsuccessful.                           - Gastritis. Biopsied.                           - Normal examined  duodenum. Moderate Sedation:      N/A Recommendation:           - Return patient to hospital ward for ongoing care.                           - Advance diet as tolerated.                           - Continue present medications. BID IV PPI, can                            change to oral tomorrow if stable.                           - No aspirin, ibuprofen, naproxen, or other                            non-steroidal anti-inflammatory drugs.                           - Await pathology results. Procedure Code(s):        --- Professional ---                           [id number], 71, Esophagogastroduodenoscopy, flexible,                            transoral; with control of bleeding, any method                           43239, Esophagogastroduodenoscopy, flexible,                            transoral; with biopsy, single or multiple Diagnosis Code(s):        --- Professional ---                           [id number], Other specified postprocedural states  K25.9, Gastric ulcer, unspecified as acute or                            chronic, without hemorrhage or perforation                           K29.70, Gastritis, unspecified, without bleeding                           D62, Acute posthemorrhagic anemia                           K92.1, Melena (includes Hematochezia) CPT copyright 2019 American Medical Association. All rights reserved. The codes documented in this report are preliminary and upon coder review may  be revised to meet current compliance requirements. Jerene Bears, MD 10/21/2020 3:39:55 PM This report has been signed electronically. Number of Addenda: 0

## 2020-10-21 NOTE — Progress Notes (Signed)
Patient accepted to [facility] for GI bleed. [facility] to assume care upon patient arrival to accepting facility.

## 2020-10-21 NOTE — Transfer of Care (Signed)
Immediate Anesthesia Transfer of Care Note  Patient: Nathaniel Williams  Procedure(s) Performed: ESOPHAGOGASTRODUODENOSCOPY (EGD) WITH PROPOFOL HEMOSTASIS CLIP PLACEMENT BIOPSY  Patient Location: Endoscopy Unit  Anesthesia Type:MAC  Level of Consciousness: awake, alert  and oriented  Airway & Oxygen Therapy: Patient Spontanous Breathing and Patient connected to nasal cannula oxygen  Post-op Assessment: Report given to RN and Post -op Vital signs reviewed and stable  Post vital signs: Reviewed and stable  Last Vitals:  Vitals Value Taken Time  BP 122/83 10/21/20 1529  Temp    Pulse 88 10/21/20 1529  Resp 15 10/21/20 1529  SpO2 100 % 10/21/20 1529  Vitals shown include unvalidated device data.  Last Pain:  Vitals:   10/21/20 1434  TempSrc: Oral  PainSc: 8          Complications: No notable events documented.

## 2020-10-22 ENCOUNTER — Encounter (HOSPITAL_COMMUNITY): Payer: Self-pay | Admitting: Internal Medicine

## 2020-10-22 DIAGNOSIS — E440 Moderate protein-calorie malnutrition: Secondary | ICD-10-CM

## 2020-10-22 DIAGNOSIS — K2900 Acute gastritis without bleeding: Secondary | ICD-10-CM

## 2020-10-22 DIAGNOSIS — K250 Acute gastric ulcer with hemorrhage: Principal | ICD-10-CM

## 2020-10-22 LAB — CBC
HCT: 21.8 % — ABNORMAL LOW (ref 39.0–52.0)
HCT: 31.3 % — ABNORMAL LOW (ref 39.0–52.0)
Hemoglobin: 7.4 g/dL — ABNORMAL LOW (ref 13.0–17.0)
Hemoglobin: 10.7 g/dL — ABNORMAL LOW (ref 13.0–17.0)
MCH: 30.1 pg (ref 26.0–34.0)
MCH: 30.7 pg (ref 26.0–34.0)
MCHC: 33.9 g/dL (ref 30.0–36.0)
MCHC: 34.2 g/dL (ref 30.0–36.0)
MCV: 88.6 fL (ref 80.0–100.0)
MCV: 89.7 fL (ref 80.0–100.0)
Platelets: 122 10*3/uL — ABNORMAL LOW (ref 150–400)
Platelets: 137 10*3/uL — ABNORMAL LOW (ref 150–400)
RBC: 2.46 MIL/uL — ABNORMAL LOW (ref 4.22–5.81)
RBC: 3.49 MIL/uL — ABNORMAL LOW (ref 4.22–5.81)
RDW: 13.6 % (ref 11.5–15.5)
RDW: 14 % (ref 11.5–15.5)
WBC: 3.8 10*3/uL — ABNORMAL LOW (ref 4.0–10.5)
WBC: 5.1 10*3/uL (ref 4.0–10.5)
nRBC: 0 % (ref 0.0–0.2)
nRBC: 0 % (ref 0.0–0.2)

## 2020-10-22 LAB — BASIC METABOLIC PANEL
Anion gap: 6 (ref 5–15)
BUN: 12 mg/dL (ref 8–23)
CO2: 26 mmol/L (ref 22–32)
Calcium: 8 mg/dL — ABNORMAL LOW (ref 8.9–10.3)
Chloride: 106 mmol/L (ref 98–111)
Creatinine, Ser: 0.66 mg/dL (ref 0.61–1.24)
GFR, Estimated: >60 mL/min (ref >60)
Glucose, Bld: 109 mg/dL — ABNORMAL HIGH (ref 70–99)
Potassium: 2.8 mmol/L — ABNORMAL LOW (ref 3.5–5.1)
Sodium: 138 mmol/L (ref 135–145)

## 2020-10-22 LAB — PREPARE RBC (CROSSMATCH)

## 2020-10-22 LAB — PHOSPHORUS: Phosphorus: 3.1 mg/dL (ref 2.5–4.6)

## 2020-10-22 LAB — MAGNESIUM: Magnesium: 1.9 mg/dL (ref 1.7–2.4)

## 2020-10-22 MED ORDER — SODIUM CHLORIDE 0.9% IV SOLUTION: Freq: Once | INTRAVENOUS | Status: AC

## 2020-10-22 MED ORDER — POTASSIUM CHLORIDE 10 MEQ/100ML IV SOLN
10.0000 meq | INTRAVENOUS | Status: AC
  Administered 2020-10-22 (×6): 10 meq via INTRAVENOUS
  Filled 2020-10-22 (×6): qty 100

## 2020-10-22 NOTE — Progress Notes (Signed)
PROGRESS NOTE    Nathaniel Williams  [id number] DOB: 30-Mar-1960 DOA: 10/20/2020 PCP: Mackie Pai, PA-C   Brief Narrative:  60 year old with history of HTN admitted for melanotic stool.  Baseline hemoglobin 14.4, down to 10.4 upon admission, FOBT positive.  Started on PPI, IV fluids.  GI team consulted. His neck pain has been chronic for which he follows at [facility] orthopedic. Has had MRI of his shoulder and neck done. He has been using NSAIDs for the past 2-3 yr, takes atleast 3-4 tabs daily.  EGD on 8/4 showed nonbleeding gastric ulcer status post clip placed.  Started on PPI twice daily.   Assessment & Plan:   Principal Problem:   GI bleed Active Problems:   Acute blood loss anemia   Alcohol use   Essential hypertension   Acute gastric ulcer with hemorrhage   Acute gastritis without hemorrhage  Acute upper GI bleed/symptomatic acute blood loss anemia Nonbleeding gastric ulcer -Baseline hemoglobin 14, admission hemoglobin 10.4 > 8.8 > 7.4 - Status post EGD 8/4 showing nonbleeding gastric ulcer.  Clip placed - PPI twice daily - Await biopsy results -Transfuse 2 unit  Hypokalemia - Aggressive repletion  Alcohol use -withdrawal protocol   Essential hypertension -Home meds on hold   Cervical Spondylosis with foraminal narrowing - Pain control. Follow [facility]. Currently undergoing conservative management.      DVT prophylaxis: SCD Code Status: Full Family Communication:    Status is: Inpatient  Remains inpatient appropriate because:Inpatient level of care appropriate due to severity of illness  Dispo: The patient is from: Home              Anticipated d/c is to: Home              Patient currently is not medically stable to d/c.  Hemoglobin trending downward, requiring 2 unit PRBC transfusion.  Continue to closely monitor.   Difficult to place patient No       Nutritional status  Nutrition Problem: Moderate Malnutrition Etiology:  chronic illness (ETOH abuse)  Signs/Symptoms: mild fat depletion, moderate fat depletion, mild muscle depletion, moderate muscle depletion  Interventions: Refer to RD note for recommendations  Body mass index is 17.28 kg/m.           Subjective: Feels ok, may have mild exertional dyspnea.   Review of Systems Otherwise negative except as per HPI, including: General: Denies fever, chills, night sweats or unintended weight loss. Resp: Denies cough, wheezing, shortness of breath. Cardiac: Denies chest pain, palpitations, orthopnea, paroxysmal nocturnal dyspnea. GI: Denies abdominal pain, nausea, vomiting, diarrhea or constipation GU: Denies dysuria, frequency, hesitancy or incontinence MS: Denies muscle aches, joint pain or swelling Neuro: Denies headache, neurologic deficits (focal weakness, numbness, tingling), abnormal gait Psych: Denies anxiety, depression, SI/HI/AVH Skin: Denies new rashes or lesions ID: Denies sick contacts, exotic exposures, travel  Examination:  General exam: Appears calm and comfortable  Respiratory system: Clear to auscultation. Respiratory effort normal. Cardiovascular system: S1 & S2 heard, RRR. No JVD, murmurs, rubs, gallops or clicks. No pedal edema. Gastrointestinal system: Abdomen is nondistended, soft and nontender. No organomegaly or masses felt. Normal bowel sounds heard. Central nervous system: Alert and oriented. No focal neurological deficits. Extremities: Symmetric 5 x 5 power. Skin: No rashes, lesions or ulcers Psychiatry: Judgement and insight appear normal. Mood & affect appropriate.     Objective: Vitals:   10/21/20 1540 10/21/20 1550 10/21/20 2201 10/22/20 0752  BP:   118/72 (!) 140/94  Pulse: 76  74 83 75  Resp:   18 17  Temp:   98.4 F (36.9 C) 98.4 F (36.9 C)  TempSrc:   Oral Oral  SpO2: 98% 100% 100% 98%  Weight:      Height:        Intake/Output Summary (Last 24 hours) at 10/22/2020 0852 Last data filed at  10/22/2020 0400 Gross per 24 hour  Intake 700 ml  Output 451 ml  Net 249 ml   Filed Weights   10/20/20 1916 10/21/20 1434  Weight: 54.4 kg 53.1 kg     Data Reviewed:   CBC: Recent Labs  Lab 10/20/20 2007 10/21/20 0532 10/21/20 1203 10/21/20 1708 10/22/20 0433  WBC 7.4  --   --  4.0 3.8*  HGB 10.4* 8.8* 8.2* 8.5* 7.4*  HCT 30.4* 25.7* 24.0* 25.9* 21.8*  MCV 88.1  --   --  91.2 88.6  PLT 166  --   --  132* 123XX123*   Basic Metabolic Panel: Recent Labs  Lab 10/20/20 2007 10/22/20 0433  NA 140 138  K 3.7 2.8*  CL 103 106  CO2 28 26  GLUCOSE 125* 109*  BUN 46* 12  CREATININE 0.66 0.66  CALCIUM 8.7* 8.0*  MG  --  1.9  PHOS  --  3.1   GFR: Estimated Creatinine Clearance: 72.8 mL/min (by C-G formula based on SCr of 0.66 mg/dL). Liver Function Tests: Recent Labs  Lab 10/20/20 2007  AST 32  ALT 21  ALKPHOS 52  BILITOT 0.6  PROT 6.9  ALBUMIN 3.8   Recent Labs  Lab 10/20/20 2007  LIPASE 27   No results for input(s): AMMONIA in the last 168 hours. Coagulation Profile: No results for input(s): INR, PROTIME in the last 168 hours. Cardiac Enzymes: No results for input(s): CKTOTAL, CKMB, CKMBINDEX, TROPONINI in the last 168 hours. BNP (last 3 results) No results for input(s): PROBNP in the last 8760 hours. HbA1C: No results for input(s): HGBA1C in the last 72 hours. CBG: No results for input(s): GLUCAP in the last 168 hours. Lipid Profile: No results for input(s): CHOL, HDL, LDLCALC, TRIG, CHOLHDL, LDLDIRECT in the last 72 hours. Thyroid Function Tests: No results for input(s): TSH, T4TOTAL, FREET4, T3FREE, THYROIDAB in the last 72 hours. Anemia Panel: No results for input(s): VITAMINB12, FOLATE, FERRITIN, TIBC, IRON, RETICCTPCT in the last 72 hours. Sepsis Labs: No results for input(s): PROCALCITON, LATICACIDVEN in the last 168 hours.  Recent Results (from the past 240 hour(s))  Resp Panel by RT-PCR (Flu A&B, Covid) Nasopharyngeal Swab     Status: None    Collection Time: 10/20/20 12:01 AM   Specimen: Nasopharyngeal Swab; Nasopharyngeal(NP) swabs in vial transport medium  Result Value Ref Range Status   SARS Coronavirus 2 by RT PCR NEGATIVE NEGATIVE Final    Comment: (NOTE) SARS-CoV-2 target nucleic acids are NOT DETECTED.  The SARS-CoV-2 RNA is generally detectable in upper respiratory specimens during the acute phase of infection. The lowest concentration of SARS-CoV-2 viral copies this assay can detect is 138 copies/mL. A negative result does not preclude SARS-Cov-2 infection and should not be used as the sole basis for treatment or other patient management decisions. A negative result may occur with  improper specimen collection/handling, submission of specimen other than nasopharyngeal swab, presence of viral mutation(s) within the areas targeted by this assay, and inadequate number of viral copies(<138 copies/mL). A negative result must be combined with clinical observations, patient history, and epidemiological information. The expected result is Negative.  Fact  Sheet for Patients:  EntrepreneurPulse.com.au  Fact Sheet for Healthcare Providers:  IncredibleEmployment.be  This test is no t yet approved or cleared by the [place] FDA and  has been authorized for detection and/or diagnosis of SARS-CoV-2 by FDA under an Emergency Use Authorization (EUA). This EUA will remain  in effect (meaning this test can be used) for the duration of the COVID-19 declaration under Section 564(b)(1) of the Act, 21 U.S.C.section 360bbb-3(b)(1), unless the authorization is terminated  or revoked sooner.       Influenza A by PCR NEGATIVE NEGATIVE Final   Influenza B by PCR NEGATIVE NEGATIVE Final    Comment: (NOTE) The Xpert Xpress SARS-CoV-2/FLU/RSV plus assay is intended as an aid in the diagnosis of influenza from Nasopharyngeal swab specimens and should not be used as a sole basis for treatment.  Nasal washings and aspirates are unacceptable for Xpert Xpress SARS-CoV-2/FLU/RSV testing.  Fact Sheet for Patients: EntrepreneurPulse.com.au  Fact Sheet for Healthcare Providers: IncredibleEmployment.be  This test is not yet approved or cleared by the [place] FDA and has been authorized for detection and/or diagnosis of SARS-CoV-2 by FDA under an Emergency Use Authorization (EUA). This EUA will remain in effect (meaning this test can be used) for the duration of the COVID-19 declaration under Section 564(b)(1) of the Act, 21 U.S.C. section 360bbb-3(b)(1), unless the authorization is terminated or revoked.  Performed at [facility], [address] [place], [place] [address]          Radiology Studies: No results found.      Scheduled Meds:  (feeding supplement) PROSource Plus  30 mL Oral TID BM   feeding supplement  1 Container Oral TID BM   folic acid  1 mg Oral Daily   multivitamin with minerals  1 tablet Oral Daily   pantoprazole (PROTONIX) IV  40 mg Intravenous Q12H   thiamine  100 mg Oral Daily   Or   thiamine  100 mg Intravenous Daily   Continuous Infusions:  sodium chloride 100 mL/hr at 10/21/20 1501     LOS: 1 day   Time spent= 35 mins    Starlee Corralejo Arsenio Loader, MD [facility]  If 7PM-7AM, please contact night-coverage  10/22/2020, 8:52 AM

## 2020-10-22 NOTE — Progress Notes (Signed)
Daily Rounding Note  10/22/2020, 12:19 PM  LOS: 1 day   SUBJECTIVE:   Chief complaint:    Melena.  Blood loss anemia.  Stools this morning are still black.  Has had 2 so far today.  Tolerating solid food, eating the bulk of what is served.  No nausea.  Feels dizzy, tachycardic when he gets up and moves around.  No angina.  OBJECTIVE:         Vital signs in last 24 hours:    Temp:  [97.9 F (36.6 C)-98.4 F (36.9 C)] 98.4 F (36.9 C) (08/05 0752) Pulse Rate:  [74-83] 75 (08/05 0752) Resp:  [16-18] 17 (08/05 0752) BP: (118-166)/(72-97) 140/94 (08/05 0752) SpO2:  [98 %-100 %] 98 % (08/05 0752) Weight:  [53.1 kg] 53.1 kg (08/04 1434) Last BM Date: 10/22/20 Filed Weights   10/20/20 1916 10/21/20 1434  Weight: 54.4 kg 53.1 kg   General: Thin, somewhat frail appearing, comfortable, pleasant. Heart: RRR. Chest: No labored breathing or cough. Abdomen: Soft, nontender, active bowel sounds.  Not distended Extremities: No CCE. Neuro/Psych: Alert.  Appropriate.  No tremors or gross weakness.  Intake/Output from previous day: 08/04 0701 - 08/05 0700 In: 700 [P.O.:100; I.V.:600] Out: 451 [Urine:450; Stool:1]  Intake/Output this shift: Total I/O In: 100 [P.O.:100] Out: -   Lab Results: Recent Labs    10/20/20 2007 10/21/20 0532 10/21/20 1203 10/21/20 1708 10/22/20 0433  WBC 7.4  --   --  4.0 3.8*  HGB 10.4*   < > 8.2* 8.5* 7.4*  HCT 30.4*   < > 24.0* 25.9* 21.8*  PLT 166  --   --  132* 122*   < > = values in this interval not displayed.   BMET Recent Labs    10/20/20 2007 10/22/20 0433  NA 140 138  K 3.7 2.8*  CL 103 106  CO2 28 26  GLUCOSE 125* 109*  BUN 46* 12  CREATININE 0.66 0.66  CALCIUM 8.7* 8.0*   LFT Recent Labs    10/20/20 2007  PROT 6.9  ALBUMIN 3.8  AST 32  ALT 21  ALKPHOS 52  BILITOT 0.6   PT/INR No results for input(s): LABPROT, INR in the last 72 hours. Hepatitis Panel No  results for input(s): HEPBSAG, HCVAB, HEPAIGM, HEPBIGM in the last 72 hours.  Studies/Results: No results found.  Scheduled Meds:  (feeding supplement) PROSource Plus  30 mL Oral TID BM   sodium chloride   Intravenous Once   feeding supplement  1 Container Oral TID BM   folic acid  1 mg Oral Daily   multivitamin with minerals  1 tablet Oral Daily   pantoprazole (PROTONIX) IV  40 mg Intravenous Q12H   thiamine  100 mg Oral Daily   Or   thiamine  100 mg Intravenous Daily   Continuous Infusions:  potassium chloride 10 mEq (10/22/20 1216)   PRN Meds:.hydrALAZINE, LORazepam **OR** LORazepam, metoprolol tartrate, morphine injection, oxyCODONE, senna-docusate   ASSESMENT:   Melena, blood loss anemia.  Taking 800 mg daily long-term. 10/21/2020 EGD.  Healthy appearing esophagogastric anastomosis 9 previous subtotal esophagectomy, gastric pull-through), irregular Z-line.  Nonbleeding gastric ulcer with pigmented material was clipped.  Gastritis biopsied.  Continues on Protonix 40 IV bid.  Hb has declined from 10.4-7.4 over the course of a couple of days.  Thrombocytopenia.  Platelets 122K  Hypokalemia, 2.8.  Azotemia without renal insufficiency, BUN now normal.  Azotemia due to GI bleeding.  PLAN         GI physician is Aurelio Brash in [place].  Follow up w him or surrogate within 3 to 4 weeks of discharge. Avoid all NSAIDs.   Not ready for discharge until CBC stable.   Leave the Protonix IV in place for now.      Azucena Freed  10/22/2020, 12:19 PM Phone [phone]

## 2020-10-23 LAB — TYPE AND SCREEN
ABO/RH(D): O POS
Antibody Screen: NEGATIVE
Unit division: 0
Unit division: 0

## 2020-10-23 LAB — BASIC METABOLIC PANEL
Anion gap: 6 (ref 5–15)
BUN: 12 mg/dL (ref 8–23)
CO2: 25 mmol/L (ref 22–32)
Calcium: 8.2 mg/dL — ABNORMAL LOW (ref 8.9–10.3)
Chloride: 106 mmol/L (ref 98–111)
Creatinine, Ser: 0.69 mg/dL (ref 0.61–1.24)
GFR, Estimated: >60 mL/min (ref >60)
Glucose, Bld: 103 mg/dL — ABNORMAL HIGH (ref 70–99)
Potassium: 3.3 mmol/L — ABNORMAL LOW (ref 3.5–5.1)
Sodium: 137 mmol/L (ref 135–145)

## 2020-10-23 LAB — CBC
HCT: 31.4 % — ABNORMAL LOW (ref 39.0–52.0)
Hemoglobin: 11 g/dL — ABNORMAL LOW (ref 13.0–17.0)
MCH: 31.2 pg (ref 26.0–34.0)
MCHC: 35 g/dL (ref 30.0–36.0)
MCV: 89 fL (ref 80.0–100.0)
Platelets: 143 10*3/uL — ABNORMAL LOW (ref 150–400)
RBC: 3.53 MIL/uL — ABNORMAL LOW (ref 4.22–5.81)
RDW: 13.9 % (ref 11.5–15.5)
WBC: 4.7 10*3/uL (ref 4.0–10.5)
nRBC: 0 % (ref 0.0–0.2)

## 2020-10-23 LAB — BPAM RBC
Blood Product Expiration Date: 202209062359
Blood Product Expiration Date: 202209062359
ISSUE DATE / TIME: 202208051249
ISSUE DATE / TIME: 202208051623
Unit Type and Rh: 5100
Unit Type and Rh: 5100

## 2020-10-23 LAB — MAGNESIUM: Magnesium: 1.8 mg/dL (ref 1.7–2.4)

## 2020-10-23 MED ORDER — PANTOPRAZOLE SODIUM 40 MG PO TBEC: 40.0000 mg | DELAYED_RELEASE_TABLET | Freq: Two times a day (BID) | ORAL | 0 refills | Status: DC

## 2020-10-23 MED ORDER — METOPROLOL SUCCINATE ER 50 MG PO TB24: 50.0000 mg | ORAL_TABLET | Freq: Every day | ORAL | 0 refills | Status: DC

## 2020-10-23 MED ORDER — PANTOPRAZOLE SODIUM 40 MG PO TBEC
40.0000 mg | DELAYED_RELEASE_TABLET | Freq: Two times a day (BID) | ORAL | Status: DC
  Administered 2020-10-23 – 2020-10-24 (×3): 40 mg via ORAL
  Filled 2020-10-23 (×3): qty 1

## 2020-10-23 MED ORDER — LISINOPRIL 10 MG PO TABS: 10.0000 mg | ORAL_TABLET | Freq: Every day | ORAL | 0 refills | Status: DC

## 2020-10-23 MED ORDER — POTASSIUM CHLORIDE CRYS ER 20 MEQ PO TBCR
40.0000 meq | EXTENDED_RELEASE_TABLET | Freq: Once | ORAL | Status: AC
  Administered 2020-10-23: 40 meq via ORAL
  Filled 2020-10-23: qty 2

## 2020-10-23 NOTE — Progress Notes (Signed)
    Progress Note   Subjective  Patient feeling well this morning other than chronic neck discomfort No abdominal pain Tolerating diet without nausea or vomiting No bowel movements this morning or since midnight, 2 soft stools yesterday which were still dark Hoping to go home soon  2 units of packed cells yesterday   Objective  Vital signs in last 24 hours: Temp:  [98.1 F (36.7 C)-98.8 F (37.1 C)] 98.3 F (36.8 C) (08/06 0602) Pulse Rate:  [67-86] 67 (08/06 0602) Resp:  [16-18] 18 (08/06 0602) BP: (128-172)/(87-102) 172/96 (08/06 0602) SpO2:  [98 %-100 %] 98 % (08/06 0602) Last BM Date: 10/22/20  General: Alert, well-developed, in NAD Heart:  Regular rate and rhythm; no murmurs Chest: Clear to ascultation bilaterally Abdomen:  Soft, nontender and nondistended. Normal bowel sounds, without guarding, and without rebound.   Extremities:  Without edema. Neurologic:  Alert and  oriented x4; grossly normal neurologically. Psych:  Alert and cooperative. Normal mood and affect.  Intake/Output from previous day: 08/05 0701 - 08/06 0700 In: 1677.9 [P.O.:340; Blood:729.4; IV Piggyback:608.4] Out: 1200 [Urine:1200] Intake/Output this shift: Total I/O In: 360 [P.O.:360] Out: 400 [Urine:400]  Lab Results: Recent Labs    10/22/20 0433 10/22/20 1914 10/23/20 0546  WBC 3.8* 5.1 4.7  HGB 7.4* 10.7* 11.0*  HCT 21.8* 31.3* 31.4*  PLT 122* 137* 143*   BMET Recent Labs    10/20/20 2007 10/22/20 0433 10/23/20 0546  NA 140 138 137  K 3.7 2.8* 3.3*  CL 103 106 106  CO2 '28 26 25  '$ GLUCOSE 125* 109* 103*  BUN 46* 12 12  CREATININE 0.66 0.66 0.69  CALCIUM 8.7* 8.0* 8.2*   LFT Recent Labs    10/20/20 2007  PROT 6.9  ALBUMIN 3.8  AST 32  ALT 21  ALKPHOS 52  BILITOT 0.6   PT/INR No results for input(s): LABPROT, INR in the last 72 hours. Hepatitis Panel No results for input(s): HEPBSAG, HCVAB, HEPAIGM, HEPBIGM in the last 72 hours.  Studies/Results: No results  found.    Assessment & Recommendations  60 year old male with achalasia and subtotal esophagectomy with gastric pull-through admitted with melena and acute blood loss anemia found to have gastric ulcer  1.  Gastric ulcer with hemorrhage/acute blood loss anemia --he has remained stable.  No evidence for active ongoing bleeding.  He did receive 2 units of packed cells yesterday with robust increase in hemoglobin, more than expected. -- Change to pantoprazole 40 mg twice daily AC; this should continue for at least 8 weeks --Strict NSAID and aspirin avoidance --I will follow-up gastric biopsies to ensure no H. pylori, if present we will treat as an outpatient --Hemoglobin increased from 7.4-11 with 2 units; I expect this will settle down more in the 9-10 range; but no evidence for active bleeding --If remains stable expect he can go home later today or tomorrow --He should follow-up with Dr. Kinnie Feil his gastroenterologist after discharge within 4 to 6 weeks; Dr. Kinnie Feil can consider surveillance upper endoscopy to ensure healing of gastric ulcer --GI will sign off call with questions       LOS: 2 days   Nathaniel Williams  10/23/2020, 9:53 AM See Shea Evans, [facility], to contact our on call provider

## 2020-10-23 NOTE — Plan of Care (Signed)
  Problem: Education: Goal: Knowledge of General Education information will improve Description: Including pain rating scale, medication(s)/side effects and non-pharmacologic comfort measures Outcome: Completed/Met   Problem: Health Behavior/Discharge Planning: Goal: Ability to manage health-related needs will improve Outcome: Completed/Met   Problem: Clinical Measurements: Goal: Will remain free from infection Outcome: Completed/Met Goal: Diagnostic test results will improve Outcome: Progressing Goal: Respiratory complications will improve Outcome: Completed/Met Goal: Cardiovascular complication will be avoided Outcome: Progressing   Problem: Activity: Goal: Risk for activity intolerance will decrease Outcome: Completed/Met   Problem: Nutrition: Goal: Adequate nutrition will be maintained Outcome: Progressing   Problem: Coping: Goal: Level of anxiety will decrease Outcome: Completed/Met   Problem: Elimination: Goal: Will not experience complications related to bowel motility Outcome: Completed/Met   Problem: Pain Managment: Goal: General experience of comfort will improve Outcome: Progressing   Problem: Safety: Goal: Ability to remain free from injury will improve Outcome: Progressing   Problem: Skin Integrity: Goal: Risk for impaired skin integrity will decrease Outcome: Completed/Met

## 2020-10-23 NOTE — Discharge Summary (Addendum)
Physician Discharge Summary  Nathaniel Williams [id number] DOB: 05-25-1960 DOA: 10/20/2020  PCP: Mackie Pai, PA-C  Admit date: 10/20/2020 Discharge date: 10/23/2020  Admitted From: Home Disposition: Home  Recommendations for Outpatient Follow-up:  Follow up with PCP in 1-2 weeks Please obtain CBC in one week your next doctors visit.  Follow-up outpatient will be arranged by GI team at their service PPI twice daily at least for 2 months thereafter daily Avoid any NSAID such as aspirin, ibuprofen, naproxen.   Discharge Condition: Stable CODE STATUS: Full code Diet recommendation: Low-salt  Brief/Interim Summary: 60 year old with history of HTN admitted for melanotic stool.  Baseline hemoglobin 14.4, down to 10.4 upon admission, FOBT positive.  Started on PPI, IV fluids.  GI team consulted. His neck pain has been chronic for which he follows at [facility] orthopedic. Has had MRI of his shoulder and neck done. He has been using NSAIDs for the past 2-3 yr, takes atleast 3-4 tabs daily.  EGD on 8/4 showed nonbleeding gastric ulcer status post clip placed.  Started on PPI twice daily which was transitioned to oral upon discharge.  Hemoglobin remained stable but required 2 unit PRBC transfusion. Medically stable for discharge today after seen by GI team.     Assessment & Plan:   Principal Problem:   GI bleed Active Problems:   Acute blood loss anemia   Alcohol use   Essential hypertension   Acute gastric ulcer with hemorrhage   Acute gastritis without hemorrhage   Acute upper GI bleed/symptomatic acute blood loss anemia Nonbleeding gastric ulcer -Baseline hemoglobin 14, admission hemoglobin 10.4 > 8.8 > 7.4.  Today 11.0 status post unit PRBC transfusion - Status post EGD 8/4 showing nonbleeding gastric ulcer.  Clip placed - PPI twice daily, transition to p.o. minimum of 60 days after daily - Biopsy results will be followed up by GI team   Hypokalemia - Aggressive repletion   Alcohol  use -withdrawal protocol   Essential hypertension - Resume home regimen-refills for lisinopril and metoprolol given   Cervical Spondylosis with foraminal narrowing - Pain control. Follow up with outpatient [facility]. Currently undergoing conservative management..  Avoid any NSAID use      Body mass index is 17.28 kg/m.         Discharge Diagnoses:  Principal Problem:   GI bleed Active Problems:   Acute blood loss anemia   Alcohol use   Essential hypertension   Acute gastric ulcer with hemorrhage   Acute gastritis without hemorrhage   Malnutrition of moderate degree      Consultations: Gastroenterology  Subjective: Feels little stressed out with his family situation at home and wishes to go home.  Otherwise no other complaints, no further bleeding.  Discharge Exam: Vitals:   10/22/20 2045 10/23/20 0602  BP: (!) 146/97 (!) 172/96  Pulse:  67  Resp: 17 18  Temp: 98.8 F (37.1 C) 98.3 F (36.8 C)  SpO2: 100% 98%   Vitals:   10/22/20 1650 10/22/20 1902 10/22/20 2045 10/23/20 0602  BP: 128/90 (!) 168/102 (!) 146/97 (!) 172/96  Pulse: 76 70  67  Resp: '16 16 17 18  '$ Temp: 98.1 F (36.7 C) 98.8 F (37.1 C) 98.8 F (37.1 C) 98.3 F (36.8 C)  TempSrc: Oral Oral Oral Oral  SpO2: 100% 100% 100% 98%  Weight:      Height:        General: Pt is alert, awake, not in acute distress Cardiovascular: RRR, S1/S2 +, no rubs, no gallops  Respiratory: CTA bilaterally, no wheezing, no rhonchi Abdominal: Soft, NT, ND, bowel sounds + Extremities: no edema, no cyanosis  Discharge Instructions   Allergies as of 10/23/2020   No Known Allergies      Medication List     STOP taking these medications    ibuprofen 200 MG tablet Commonly known as: ADVIL       TAKE these medications    diclofenac 75 MG EC tablet Commonly known as: VOLTAREN Take 75 mg by mouth 2 (two) times daily.   lidocaine 5 % Commonly known as: LIDODERM Place 1 patch onto the skin  daily as needed (back pain). Remove & Discard patch within 12 hours or as directed by MD   lisinopril 10 MG tablet Commonly known as: ZESTRIL Take 1 tablet (10 mg total) by mouth daily.   metoprolol succinate 50 MG 24 hr tablet Commonly known as: TOPROL-XL Take 1 tablet (50 mg total) by mouth daily. TAKE WITH OR IMMEDIATELY FOLLOWING A MEAL. What changed: additional instructions   OSTEO BI-FLEX ADV TRIPLE ST PO Take 1 tablet by mouth daily.   OVER THE COUNTER MEDICATION Take 1 tablet by mouth daily. [employer] MEGA MEN ENERGY AND METABOBLISM   pantoprazole 40 MG tablet Commonly known as: Protonix Take 1 tablet (40 mg total) by mouth 2 (two) times daily before a meal.   polyvinyl alcohol 1.4 % ophthalmic solution Commonly known as: LIQUIFILM TEARS Place 1 drop into both eyes as needed for dry eyes.   SUPER B COMPLEX PO Take 1 tablet by mouth daily.   TIGER BALM MUSCLE RUB EX Apply 1 application topically as needed (back pain).   Turmeric Curcumin 500 MG Caps Take 1 capsule by mouth daily.   vitamin C 1000 MG tablet Take 1,000 mg by mouth daily.   Vitamin D3 50 MCG (2000 UT) capsule Take 2,000 Units by mouth daily.   vitamin E 180 MG (400 UNITS) capsule Take 400 Units by mouth daily.        Follow-up Information     Rhoton, Shelda Altes., MD Follow up.   Specialty: Gastroenterology Why: call for appointment to follow up bleeding ulcer. Contact information: [address] [place] [address] [phone]         Saguier, Percell Miller, PA-C Follow up in 1 week(s).   Specialties: Internal Medicine, Family Medicine Contact information: [address] [place] [address] [place] [address] [phone]                No Known Allergies  You were cared for by a hospitalist during your hospital stay. If you have any questions about your discharge medications or the care you received while you were in the hospital after you are discharged, you can call the  unit and asked to speak with the hospitalist on call if the hospitalist that took care of you is not available. Once you are discharged, your primary care physician will handle any further medical issues. Please note that no refills for any discharge medications will be authorized once you are discharged, as it is imperative that you return to your primary care physician (or establish a relationship with a primary care physician if you do not have one) for your aftercare needs so that they can reassess your need for medications and monitor your lab values.   Procedures/Studies: No results found.   The results of significant diagnostics from this hospitalization (including imaging, microbiology, ancillary and laboratory) are listed below for  reference.     Microbiology: Recent Results (from the past 240 hour(s))  Resp Panel by RT-PCR (Flu A&B, Covid) Nasopharyngeal Swab     Status: None   Collection Time: 10/20/20 12:01 AM   Specimen: Nasopharyngeal Swab; Nasopharyngeal(NP) swabs in vial transport medium  Result Value Ref Range Status   SARS Coronavirus 2 by RT PCR NEGATIVE NEGATIVE Final    Comment: (NOTE) SARS-CoV-2 target nucleic acids are NOT DETECTED.  The SARS-CoV-2 RNA is generally detectable in upper respiratory specimens during the acute phase of infection. The lowest concentration of SARS-CoV-2 viral copies this assay can detect is 138 copies/mL. A negative result does not preclude SARS-Cov-2 infection and should not be used as the sole basis for treatment or other patient management decisions. A negative result may occur with  improper specimen collection/handling, submission of specimen other than nasopharyngeal swab, presence of viral mutation(s) within the areas targeted by this assay, and inadequate number of viral copies(<138 copies/mL). A negative result must be combined with clinical observations, patient history, and epidemiological information. The expected result is  Negative.  Fact Sheet for Patients:  EntrepreneurPulse.com.au  Fact Sheet for Healthcare Providers:  IncredibleEmployment.be  This test is no t yet approved or cleared by the [place] FDA and  has been authorized for detection and/or diagnosis of SARS-CoV-2 by FDA under an Emergency Use Authorization (EUA). This EUA will remain  in effect (meaning this test can be used) for the duration of the COVID-19 declaration under Section 564(b)(1) of the Act, 21 U.S.C.section 360bbb-3(b)(1), unless the authorization is terminated  or revoked sooner.       Influenza A by PCR NEGATIVE NEGATIVE Final   Influenza B by PCR NEGATIVE NEGATIVE Final    Comment: (NOTE) The Xpert Xpress SARS-CoV-2/FLU/RSV plus assay is intended as an aid in the diagnosis of influenza from Nasopharyngeal swab specimens and should not be used as a sole basis for treatment. Nasal washings and aspirates are unacceptable for Xpert Xpress SARS-CoV-2/FLU/RSV testing.  Fact Sheet for Patients: EntrepreneurPulse.com.au  Fact Sheet for Healthcare Providers: IncredibleEmployment.be  This test is not yet approved or cleared by the [place] FDA and has been authorized for detection and/or diagnosis of SARS-CoV-2 by FDA under an Emergency Use Authorization (EUA). This EUA will remain in effect (meaning this test can be used) for the duration of the COVID-19 declaration under Section 564(b)(1) of the Act, 21 U.S.C. section 360bbb-3(b)(1), unless the authorization is terminated or revoked.  Performed at [facility], [address] [place], [place] [address]      Labs: BNP (last 3 results) No results for input(s): BNP in the last 8760 hours. Basic Metabolic Panel: Recent Labs  Lab 10/20/20 2007 10/22/20 0433 10/23/20 0546  NA 140 138 137  K 3.7 2.8* 3.3*  CL 103 106 106  CO2 '28 26 25  '$ GLUCOSE 125* 109* 103*  BUN 46* 12  12  CREATININE 0.66 0.66 0.69  CALCIUM 8.7* 8.0* 8.2*  MG  --  1.9 1.8  PHOS  --  3.1  --    Liver Function Tests: Recent Labs  Lab 10/20/20 2007  AST 32  ALT 21  ALKPHOS 52  BILITOT 0.6  PROT 6.9  ALBUMIN 3.8   Recent Labs  Lab 10/20/20 2007  LIPASE 27   No results for input(s): AMMONIA in the last 168 hours. CBC: Recent Labs  Lab 10/20/20 2007 10/21/20 0532 10/21/20 1203 10/21/20 1708 10/22/20 [id number] 10/22/20 1914 10/23/20 [id number]  WBC 7.4  --   --  4.0 3.8* 5.1 4.7  HGB 10.4*   < > 8.2* 8.5* 7.4* 10.7* 11.0*  HCT 30.4*   < > 24.0* 25.9* 21.8* 31.3* 31.4*  MCV 88.1  --   --  91.2 88.6 89.7 89.0  PLT 166  --   --  132* 122* 137* 143*   < > = values in this interval not displayed.   Cardiac Enzymes: No results for input(s): CKTOTAL, CKMB, CKMBINDEX, TROPONINI in the last 168 hours. BNP: Invalid input(s): POCBNP CBG: No results for input(s): GLUCAP in the last 168 hours. D-Dimer No results for input(s): DDIMER in the last 72 hours. Hgb A1c No results for input(s): HGBA1C in the last 72 hours. Lipid Profile No results for input(s): CHOL, HDL, LDLCALC, TRIG, CHOLHDL, LDLDIRECT in the last 72 hours. Thyroid function studies No results for input(s): TSH, T4TOTAL, T3FREE, THYROIDAB in the last 72 hours.  Invalid input(s): FREET3 Anemia work up No results for input(s): VITAMINB12, FOLATE, FERRITIN, TIBC, IRON, RETICCTPCT in the last 72 hours. Urinalysis    Component Value Date/Time   COLORURINE YELLOW 10/20/2020 1937   APPEARANCEUR CLEAR 10/20/2020 1937   LABSPEC <1.005 (L) 10/20/2020 1937   PHURINE 6.5 10/20/2020 1937   GLUCOSEU NEGATIVE 10/20/2020 1937   HGBUR NEGATIVE 10/20/2020 1937   BILIRUBINUR NEGATIVE 10/20/2020 1937   BILIRUBINUR 1 04/26/2020 1034   KETONESUR NEGATIVE 10/20/2020 1937   PROTEINUR NEGATIVE 10/20/2020 1937   UROBILINOGEN 1.0 04/26/2020 1034   NITRITE NEGATIVE 10/20/2020 1937   LEUKOCYTESUR NEGATIVE 10/20/2020 1937   Sepsis  Labs Invalid input(s): PROCALCITONIN,  WBC,  LACTICIDVEN Microbiology Recent Results (from the past 240 hour(s))  Resp Panel by RT-PCR (Flu A&B, Covid) Nasopharyngeal Swab     Status: None   Collection Time: 10/20/20 12:01 AM   Specimen: Nasopharyngeal Swab; Nasopharyngeal(NP) swabs in vial transport medium  Result Value Ref Range Status   SARS Coronavirus 2 by RT PCR NEGATIVE NEGATIVE Final    Comment: (NOTE) SARS-CoV-2 target nucleic acids are NOT DETECTED.  The SARS-CoV-2 RNA is generally detectable in upper respiratory specimens during the acute phase of infection. The lowest concentration of SARS-CoV-2 viral copies this assay can detect is 138 copies/mL. A negative result does not preclude SARS-Cov-2 infection and should not be used as the sole basis for treatment or other patient management decisions. A negative result may occur with  improper specimen collection/handling, submission of specimen other than nasopharyngeal swab, presence of viral mutation(s) within the areas targeted by this assay, and inadequate number of viral copies(<138 copies/mL). A negative result must be combined with clinical observations, patient history, and epidemiological information. The expected result is Negative.  Fact Sheet for Patients:  EntrepreneurPulse.com.au  Fact Sheet for Healthcare Providers:  IncredibleEmployment.be  This test is no t yet approved or cleared by the [place] FDA and  has been authorized for detection and/or diagnosis of SARS-CoV-2 by FDA under an Emergency Use Authorization (EUA). This EUA will remain  in effect (meaning this test can be used) for the duration of the COVID-19 declaration under Section 564(b)(1) of the Act, 21 U.S.C.section 360bbb-3(b)(1), unless the authorization is terminated  or revoked sooner.       Influenza A by PCR NEGATIVE NEGATIVE Final   Influenza B by PCR NEGATIVE NEGATIVE Final    Comment:  (NOTE) The Xpert Xpress SARS-CoV-2/FLU/RSV plus assay is intended as an aid in the diagnosis of influenza from Nasopharyngeal swab specimens and should not be  used as a sole basis for treatment. Nasal washings and aspirates are unacceptable for Xpert Xpress SARS-CoV-2/FLU/RSV testing.  Fact Sheet for Patients: EntrepreneurPulse.com.au  Fact Sheet for Healthcare Providers: IncredibleEmployment.be  This test is not yet approved or cleared by the [place] FDA and has been authorized for detection and/or diagnosis of SARS-CoV-2 by FDA under an Emergency Use Authorization (EUA). This EUA will remain in effect (meaning this test can be used) for the duration of the COVID-19 declaration under Section 564(b)(1) of the Act, 21 U.S.C. section 360bbb-3(b)(1), unless the authorization is terminated or revoked.  Performed at [facility], [address] [place], [place] [address]      Time coordinating discharge:  I have spent 35 minutes face to face with the patient and on the ward discussing the patients care, assessment, plan and disposition with other care givers. >50% of the time was devoted counseling the patient about the risks and benefits of treatment/Discharge disposition and coordinating care.   SIGNED:   Damita Lack, MD  [facility] 10/23/2020, 2:26 PM   If 7PM-7AM, please contact night-coverage

## 2020-10-23 NOTE — TOC Transition Note (Signed)
Transition of Care [facility]) - CM/SW Discharge Note   Patient Details  Name: Meshach Nezat MRN: [id number] Date of Birth: [birth year]  Transition of Care [facility]) CM/SW Contact:  Bartholomew Crews, RN Phone Number: [phone] 10/23/2020, 2:04 PM   Clinical Narrative:     Spoke with patient and his significant other at the bedside to discuss transition planning. Patient requesting assistance with medications. Patient uses [facility]. Discussed cost difference [facility] and [facility] when using [facility]. Did [facility] of discharge [facility]. Patient requesting prescriptions be changed to [facility] [address] in [place]. MD made aware.   Verified PCP in Epic. Patient stated that he last saw PCP in March. Encouraged to schedule hospital follow up appointment. Patient verbalized understanding.   No further TOC needs identified.   Final next level of care: Home/Self Care Barriers to Discharge: No Barriers Identified   Patient Goals and CMS Choice Patient states their goals for this hospitalization and ongoing recovery are:: return home CMS Medicare.gov Compare Post Acute Care list provided to:: Patient Choice offered to / list presented to : NA  Discharge Placement                       Discharge Plan and Services                DME Arranged: N/A DME Agency: NA       HH Arranged: NA HH Agency: NA        Social Determinants of Health (SDOH) Interventions     Readmission Risk Interventions No flowsheet data found.

## 2020-10-24 LAB — MAGNESIUM: Magnesium: 1.9 mg/dL (ref 1.7–2.4)

## 2020-10-24 LAB — BASIC METABOLIC PANEL
Anion gap: 6 (ref 5–15)
BUN: 15 mg/dL (ref 8–23)
CO2: 26 mmol/L (ref 22–32)
Calcium: 8.3 mg/dL — ABNORMAL LOW (ref 8.9–10.3)
Chloride: 105 mmol/L (ref 98–111)
Creatinine, Ser: 0.75 mg/dL (ref 0.61–1.24)
GFR, Estimated: >60 mL/min (ref >60)
Glucose, Bld: 109 mg/dL — ABNORMAL HIGH (ref 70–99)
Potassium: 3.4 mmol/L — ABNORMAL LOW (ref 3.5–5.1)
Sodium: 137 mmol/L (ref 135–145)

## 2020-10-24 LAB — CBC
HCT: 31.5 % — ABNORMAL LOW (ref 39.0–52.0)
Hemoglobin: 10.9 g/dL — ABNORMAL LOW (ref 13.0–17.0)
MCH: 31.1 pg (ref 26.0–34.0)
MCHC: 34.6 g/dL (ref 30.0–36.0)
MCV: 89.7 fL (ref 80.0–100.0)
Platelets: 172 10*3/uL (ref 150–400)
RBC: 3.51 MIL/uL — ABNORMAL LOW (ref 4.22–5.81)
RDW: 14 % (ref 11.5–15.5)
WBC: 6.6 10*3/uL (ref 4.0–10.5)
nRBC: 0 % (ref 0.0–0.2)

## 2020-10-24 NOTE — Progress Notes (Signed)
Patient did not leave yesterday as he stated he did not feel comfortable?  I have seen and examined again this morning, does not have any complaints.  No acute events overnight.  Tells me he is ready go home today. Hemoglobin remained stable, no further signs of bleeding. Vital signs are stable.  Constitutional: Not in acute distress Respiratory: Clear to auscultation bilaterally Cardiovascular: Normal sinus rhythm, no rubs Abdomen: Nontender nondistended good bowel sounds Musculoskeletal: No edema noted Skin: No rashes seen Neurologic: CN 2-12 grossly intact.  And nonfocal Psychiatric: Normal judgment and insight. Alert and oriented x 3. Normal mood.  Discharge summary completed 10/23/2020.  No further changes.  Please call with further questions as needed.  Gerlean Ren MD [facility]

## 2020-10-24 NOTE — Progress Notes (Signed)
Pt is discharged to home. DC instructions given. Pt voiced concerns about prescription for pain med. MD made aware. Per MD, Pt to take tylenol  for pain. Pt made aware. Voiced understanding. Pt left unit in wheelchair pushed by nurse tech accompanied by male family member. No concerns voiced.

## 2020-10-25 ENCOUNTER — Telehealth: Payer: Self-pay

## 2020-10-25 LAB — SURGICAL PATHOLOGY

## 2020-10-25 NOTE — Telephone Encounter (Signed)
Transition Care Management Follow-up Telephone Call Date of discharge and from where: 10/24/2020- How have you been since you were released from the hospital? Doing ok but having some pain. Any questions or concerns? No  Items Reviewed: Did the pt receive and understand the discharge instructions provided? Yes  Medications obtained and verified? Yes  Other? Yes  Any new allergies since your discharge? No  Dietary orders reviewed? Yes Do you have support at home? Yes   Home Care and Equipment/Supplies: Were home health services ordered? no If so, what is the name of the agency? N/a  Has the agency set up a time to come to the patient's home? not applicable Were any new equipment or medical supplies ordered?  No What is the name of the medical supply agency? N/a Were you able to get the supplies/equipment? not applicable Do you have any questions related to the use of the equipment or supplies? N/a  Functional Questionnaire: (I = Independent and D = Dependent) ADLs: I  Bathing/Dressing- I  Meal Prep- I  Eating- I  Maintaining continence- I  Transferring/Ambulation- I  Managing Meds- I  Follow up appointments reviewed:  PCP Hospital f/u appt confirmed? Yes  Scheduled to see Mackie Pai on 10/27/2020 @ 1:20. [facility] f/u appt confirmed? No  Patient to call & schedule Are transportation arrangements needed? No  If their condition worsens, is the pt aware to call PCP or go to the Emergency Dept.? Yes Was the patient provided with contact information for the PCP's office or ED? Yes Was to pt encouraged to call back with questions or concerns? Yes

## 2020-10-26 ENCOUNTER — Encounter: Payer: Self-pay | Admitting: Internal Medicine

## 2020-10-27 ENCOUNTER — Ambulatory Visit (INDEPENDENT_AMBULATORY_CARE_PROVIDER_SITE_OTHER): Payer: Self-pay | Admitting: Medical

## 2020-10-27 ENCOUNTER — Other Ambulatory Visit: Payer: Self-pay

## 2020-10-27 VITALS — BP 142/86 | HR 66 | Temp 98.3°F | Resp 18 | Ht 69.0 in | Wt 122.0 lb

## 2020-10-27 DIAGNOSIS — K219 Gastro-esophageal reflux disease without esophagitis: Secondary | ICD-10-CM

## 2020-10-27 DIAGNOSIS — E876 Hypokalemia: Secondary | ICD-10-CM

## 2020-10-27 DIAGNOSIS — M542 Cervicalgia: Secondary | ICD-10-CM

## 2020-10-27 DIAGNOSIS — K274 Chronic or unspecified peptic ulcer, site unspecified, with hemorrhage: Secondary | ICD-10-CM

## 2020-10-27 DIAGNOSIS — I10 Essential (primary) hypertension: Secondary | ICD-10-CM

## 2020-10-27 DIAGNOSIS — F1011 Alcohol abuse, in remission: Secondary | ICD-10-CM

## 2020-10-27 MED ORDER — PANTOPRAZOLE SODIUM 40 MG PO TBEC: 40.0000 mg | DELAYED_RELEASE_TABLET | Freq: Two times a day (BID) | ORAL | 0 refills | Status: DC

## 2020-10-27 MED ORDER — TRAMADOL HCL 50 MG PO TABS: 50.0000 mg | ORAL_TABLET | Freq: Four times a day (QID) | ORAL | 0 refills | Status: AC | PRN

## 2020-10-27 NOTE — Progress Notes (Signed)
Subjective:    Patient ID: Nathaniel Williams, male    DOB: [birth year], 60 y.o.   MRN: [id number]  HPI Pt in for follow up from follow up from hospital.   Admit date: 10/20/2020 Discharge date: 10/23/2020   Pt here with family member.   Admitted From: Home Disposition: Home   Recommendations for Outpatient Follow-up:  Follow up with PCP in 1-2 weeks Please obtain CBC in one week your next doctors visit.  Follow-up outpatient will be arranged by GI team at their service PPI twice daily at least for 2 months thereafter daily Avoid any NSAID such as aspirin, ibuprofen, naproxen.   Brief/Interim Summary: 60 year old with history of HTN admitted for melanotic stool.  Baseline hemoglobin 14.4, down to 10.4 upon admission, FOBT positive.  Started on PPI, IV fluids.  GI team consulted. His neck pain has been chronic for which he follows at [facility] orthopedic. Has had MRI of his shoulder and neck done. He has been using NSAIDs for the past 2-3 yr, takes atleast 3-4 tabs daily.  EGD on 8/4 showed nonbleeding gastric ulcer status post clip placed.  Started on PPI twice daily which was transitioned to oral upon discharge.  Hemoglobin remained stable but required 2 unit PRBC transfusion. Medically stable for discharge today after seen by GI team.   Assessment & Plan:   Principal Problem:   GI bleed Active Problems:   Acute blood loss anemia   Alcohol use   Essential hypertension   Acute gastric ulcer with hemorrhage   Acute gastritis without hemorrhage  Acute upper GI bleed/symptomatic acute blood loss anemia Nonbleeding gastric ulcer -Baseline hemoglobin 14, admission hemoglobin 10.4 > 8.8 > 7.4.  Today 11.0 status post unit PRBC transfusion - Status post EGD 8/4 showing nonbleeding gastric ulcer.  Clip placed - PPI twice daily, transition to p.o. minimum of 60 days after daily - Biopsy results will be followed up by GI team   Hypokalemia - Aggressive repletion   Alcohol  use -withdrawal protocol   Essential hypertension - Resume home regimen-refills for lisinopril and metoprolol given  Cervical Spondylosis with foraminal narrowing - Pain control. Follow up with outpatient Ortho at [facility]. Currently undergoing conservative management..  Avoid any NSAID use  Pt tellls me he still has pain in his neck. He is not sleeping due to neck pain. Pt is aware he should not take nsaids to his ulcer.   Pt has pain in neck and shoulder. Pt last saw [facility] ortho. In hospital they gave pt oxycodone. Original injury was workers comp injury. Seen by ortho-sports medicine 10-12-2020. On review of [facility] site I do not see any narcotics. Pt tells me he had been on flexeril in the past. Other than oxyccdone no pain meds per pt report.   Pt not having abdomen pain. No reported fatigue. No black or blood stool. No alcohol use.   Review of Systems  Constitutional:  Negative for chills, fatigue and fever.  HENT:  Negative for congestion.   Respiratory:  Negative for cough, chest tightness, shortness of breath and wheezing.   Cardiovascular:  Negative for chest pain and palpitations.  Gastrointestinal:  Negative for abdominal pain, anal bleeding, blood in stool, constipation, diarrhea, nausea and rectal pain.  Genitourinary:  Negative for dysuria.  Musculoskeletal:  Negative for back pain, myalgias and neck pain.  Skin:  Negative for rash.  Neurological:  Negative for dizziness, speech difficulty, weakness, light-headedness, numbness and headaches.  Hematological:  Negative for adenopathy.  Psychiatric/Behavioral:  Negative for behavioral problems, decreased concentration and dysphoric mood.     Past Medical History:  Diagnosis Date   Hypertension      Social History   Socioeconomic History   Marital status: Legally Separated    Spouse name: Not on file   Number of children: Not on file   Years of education: Not on file   Highest education level: Not on file  Occupational  History   Not on file  Tobacco Use   Smoking status: Former    Packs/day: 0.25    Years: 18.00    Pack years: 4.50    Types: Cigarettes   Smokeless tobacco: Never  Vaping Use   Vaping Use: Never used  Substance and Sexual Activity   Alcohol use: Not Currently   Drug use: Never   Sexual activity: Not on file  Other Topics Concern   Not on file  Social History Narrative   Not on file   Social Determinants of Health   Financial Resource Strain: Not on file  Food Insecurity: Not on file  Transportation Needs: Not on file  Physical Activity: Not on file  Stress: Not on file  Social Connections: Not on file  Intimate Partner Violence: Not on file    Past Surgical History:  Procedure Laterality Date   ABDOMINAL SURGERY     BIOPSY  10/21/2020   Procedure: BIOPSY;  Surgeon: Jerene Bears, MD;  Location: [facility];  Service: Gastroenterology;;   ESOPHAGOGASTRODUODENOSCOPY (EGD) WITH PROPOFOL N/A 10/21/2020   Procedure: ESOPHAGOGASTRODUODENOSCOPY (EGD) WITH PROPOFOL;  Surgeon: Jerene Bears, MD;  Location: [place];  Service: Gastroenterology;  Laterality: N/A;   HEMOSTASIS CLIP PLACEMENT  10/21/2020   Procedure: HEMOSTASIS CLIP PLACEMENT;  Surgeon: Jerene Bears, MD;  Location: [facility];  Service: Gastroenterology;;    Family History  Problem Relation Age of Onset   Cervical cancer Mother    Alzheimer's disease Father     No Known Allergies  Current Outpatient Medications on File Prior to Visit  Medication Sig Dispense Refill   Ascorbic Acid (VITAMIN C) 1000 MG tablet Take 1,000 mg by mouth daily.     B Complex-C (SUPER B COMPLEX PO) Take 1 tablet by mouth daily.     Camphor-Menthol-Methyl Sal (TIGER BALM MUSCLE RUB EX) Apply 1 application topically as needed (back pain).     Cholecalciferol (VITAMIN D3) 50 MCG (2000 UT) capsule Take 2,000 Units by mouth daily.     diclofenac (VOLTAREN) 75 MG EC tablet Take 75 mg by mouth 2 (two) times daily.     lidocaine  (LIDODERM) 5 % Place 1 patch onto the skin daily as needed (back pain). Remove & Discard patch within 12 hours or as directed by MD     lisinopril (ZESTRIL) 10 MG tablet Take 1 tablet (10 mg total) by mouth daily. 30 tablet 0   metoprolol succinate (TOPROL-XL) 50 MG 24 hr tablet Take 1 tablet (50 mg total) by mouth daily. TAKE WITH OR IMMEDIATELY FOLLOWING A MEAL. 30 tablet 0   Misc Natural Products (OSTEO BI-FLEX ADV TRIPLE ST PO) Take 1 tablet by mouth daily.     OVER THE COUNTER MEDICATION Take 1 tablet by mouth daily. GNC MEGA MEN ENERGY AND METABOBLISM     pantoprazole (PROTONIX) 40 MG tablet Take 1 tablet (40 mg total) by mouth 2 (two) times daily before a meal. 120 tablet 0   polyvinyl alcohol (LIQUIFILM TEARS) 1.4 % ophthalmic solution Place 1 drop into both  eyes as needed for dry eyes.     Turmeric Curcumin 500 MG CAPS Take 1 capsule by mouth daily.     vitamin E 180 MG (400 UNITS) capsule Take 400 Units by mouth daily.     No current facility-administered medications on file prior to visit.    BP (!) 142/86   Pulse 66   Temp 98.3 F (36.8 C)   Resp 18   Ht '5\' 9"'$  (1.753 m)   SpO2 100%   BMI 17.28 kg/m       Objective:   Physical Exam  General Mental Status- Alert. General Appearance- Not in acute distress.   Skin General: Color- Normal Color. Moisture- Normal Moisture.  Neck . No JVD. Pain on turning head to rt and left.  Chest and Lung Exam Auscultation: Breath Sounds:-Normal.  Cardiovascular Auscultation:Rythm- Regular. Murmurs & Other Heart Sounds:Auscultation of the heart reveals- No Murmurs.  Abdomen Inspection:-Inspeection Normal. Palpation/Percussion:Note:No mass. Palpation and Percussion of the abdomen reveal- Non Tender, Non Distended + BS, no rebound or guarding.  Neurologic Cranial Nerve exam:- CN III-XII intact(No nystagmus), symmetric smile. Strength:- 5/5 equal and symmetric strength both upper and lower extremities.       Assessment &  Plan:   History of gastric ulcer recently while hospitalized.  Continue Protonix twice daily.  Avoid NSAIDs and avoid alcohol.  I do want you to call ahead and call GI MD for follow-up.  Let me know if you are having trouble scheduling that.  Will get CBC, CMP and lipase today as posthospitalization labs.  History of neck pain and seeing orthopedic MD through [employer]  On review of discharge summary appears use of NSAIDs contributing factor in development of ulcer.  Stressed again not use any NSAIDs.  Presently for neck pain will prescribe tramadol.  We will see if this helps control pain.  Asked that you contact orthopedic again to seek further advice regarding plan going forward.  Follow up 2 weeks or sooner if needed.  Mackie Pai, PA-C

## 2020-10-27 NOTE — Patient Instructions (Addendum)
History of gastric ulcer recently while hospitalized.  Continue Protonix twice daily.  Avoid NSAIDs and avoid alcohol.  I do want you to call ahead and call GI MD for follow-up.  Let me know if you are having trouble scheduling that.  Will get CBC, CMP and lipase today as posthospitalization labs.  History of neck pain and seeing orthopedic MD through [employer].  On review of discharge summary appears use of NSAIDs contributing factor in development of ulcer.  Stressed again not use any NSAIDs.  Presently for neck pain will prescribe tramadol.  We will see if this helps control pain.  Asked that you contact orthopedic again to seek further advice regarding plan going forward.  Follow up 2 weeks or sooner if needed.

## 2020-10-28 LAB — COMPREHENSIVE METABOLIC PANEL
ALT: 26 U/L (ref 0–53)
AST: 25 U/L (ref 0–37)
Albumin: 3.7 g/dL (ref 3.5–5.2)
Alkaline Phosphatase: 50 U/L (ref 39–117)
BUN: 23 mg/dL (ref 6–23)
CO2: 28 mEq/L (ref 19–32)
Calcium: 8.4 mg/dL (ref 8.4–10.5)
Chloride: 106 mEq/L (ref 96–112)
Creatinine, Ser: 0.91 mg/dL (ref 0.40–1.50)
GFR: 90.75 mL/min (ref >60.00)
Glucose, Bld: 88 mg/dL (ref 70–99)
Potassium: 4.2 mEq/L (ref 3.5–5.1)
Sodium: 141 mEq/L (ref 135–145)
Total Bilirubin: 0.3 mg/dL (ref 0.2–1.2)
Total Protein: 6.1 g/dL (ref 6.0–8.3)

## 2020-10-28 LAB — CBC WITH DIFFERENTIAL/PLATELET
Basophils Absolute: 0.1 10*3/uL (ref 0.0–0.1)
Basophils Relative: 1 % (ref 0.0–3.0)
Eosinophils Absolute: 0.1 10*3/uL (ref 0.0–0.7)
Eosinophils Relative: 2.3 % (ref 0.0–5.0)
HCT: 31.5 % — ABNORMAL LOW (ref 39.0–52.0)
Hemoglobin: 10.6 g/dL — ABNORMAL LOW (ref 13.0–17.0)
Lymphocytes Relative: 22.3 % (ref 12.0–46.0)
Lymphs Abs: 1.1 10*3/uL (ref 0.7–4.0)
MCHC: 33.7 g/dL (ref 30.0–36.0)
MCV: 92.3 fl (ref 78.0–100.0)
Monocytes Absolute: 0.8 10*3/uL (ref 0.1–1.0)
Monocytes Relative: 15.3 % — ABNORMAL HIGH (ref 3.0–12.0)
Neutro Abs: 3 10*3/uL (ref 1.4–7.7)
Neutrophils Relative %: 59.1 % (ref 43.0–77.0)
Platelets: 260 10*3/uL (ref 150.0–400.0)
RBC: 3.41 Mil/uL — ABNORMAL LOW (ref 4.22–5.81)
RDW: 15.2 % (ref 11.5–15.5)
WBC: 5 10*3/uL (ref 4.0–10.5)

## 2020-10-28 LAB — LIPASE: Lipase: 123 U/L — ABNORMAL HIGH (ref 11.0–59.0)

## 2020-10-28 NOTE — Progress Notes (Signed)
Your metabolic panel shows good kidney function and normal liver enzymes. Your infection fighting cells are not elevated. Your anemia level is in stable range. Your pancrease protein is elevated(mild pancreatitis). This can occur with alcohol use. Please make sure not drinking any alcohol. Also avoid greasy/fried/fatty foods. Recommend eating very bland diet. If you have any abdomen pain, nausea or vomiting recommend emergency dept evaluation. Would ask you schedule follow up with me on 22 nd or 23 rd for office visit and repeat pancrease protein. Please call the gastroenterologist office and schedule for follow up on your GI/stomach bleed.

## 2020-11-04 ENCOUNTER — Telehealth: Payer: Self-pay | Admitting: Medical

## 2020-11-04 NOTE — Telephone Encounter (Signed)
Pt dropped off document to be filled out by provider (FMLA paperwork - 13 pages) Pt states has an appt on 11-10-2020 with provider and would like to pick up document this day. Document put at front office tray under providers name.

## 2020-11-04 NOTE — Telephone Encounter (Signed)
I wont be here but I placed the form on my desk under the green paper , and tell Nathaniel Williams there are no prior FMLA forms as well .

## 2020-11-10 ENCOUNTER — Other Ambulatory Visit: Payer: Self-pay

## 2020-11-10 ENCOUNTER — Ambulatory Visit (INDEPENDENT_AMBULATORY_CARE_PROVIDER_SITE_OTHER): Payer: Self-pay | Admitting: Medical

## 2020-11-10 ENCOUNTER — Other Ambulatory Visit: Payer: Self-pay | Admitting: Medical

## 2020-11-10 VITALS — BP 124/78 | HR 97 | Temp 98.4°F | Resp 18 | Ht 69.0 in | Wt 123.0 lb

## 2020-11-10 DIAGNOSIS — F1011 Alcohol abuse, in remission: Secondary | ICD-10-CM

## 2020-11-10 DIAGNOSIS — K219 Gastro-esophageal reflux disease without esophagitis: Secondary | ICD-10-CM

## 2020-11-10 DIAGNOSIS — K861 Other chronic pancreatitis: Secondary | ICD-10-CM

## 2020-11-10 DIAGNOSIS — M542 Cervicalgia: Secondary | ICD-10-CM

## 2020-11-10 DIAGNOSIS — D62 Acute posthemorrhagic anemia: Secondary | ICD-10-CM

## 2020-11-10 DIAGNOSIS — K274 Chronic or unspecified peptic ulcer, site unspecified, with hemorrhage: Secondary | ICD-10-CM

## 2020-11-10 MED ORDER — TRAMADOL HCL 50 MG PO TABS: 50.0000 mg | ORAL_TABLET | Freq: Three times a day (TID) | ORAL | 0 refills | Status: AC | PRN

## 2020-11-10 NOTE — Patient Instructions (Signed)
History of GERD, gastric ulcer, anemia and mild pancreatitis.  Continue on Protonix, bland healthy foods and avoid all alcohol.  We will repeat your CBC, CMP and lipase level today.  Please reach out to GI office again for follow-up appointment.  If you are unable to schedule appointment please call our office and update me.  For moderate to severe neck pain follow modified work duty per [employer]. MD.  Can use Tylenol for neck pain and provided limited prescription of tramadol for moderate to severe pain.  Rx advisement given regarding tramadol side effects.  Filled out separate FMLA paperwork today specifically for recent hospitalization and GI issues.  FMLA forms need to be filled out related to neck pain and Worker's Comp./orthopedic can fill out.  Would defer that to specialist.  Follow-up in 1 month or sooner if needed.

## 2020-11-10 NOTE — Telephone Encounter (Signed)
Pt has been seen, forms have been faxed and copy in scan. (Copy is also in HG drawer). Pt has the original.

## 2020-11-10 NOTE — Progress Notes (Signed)
Subjective:    Patient ID: Nathaniel Williams, male    DOB: 1961/02/03, 60 y.o.   MRN: [id number]  HPI  Pt in for follow up.  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   Pt has missed worked for 2 reasons gastric ulcer and also had been missing days in past due ton neck pain.   For gastric ulcer history pt is on protonix. He has yet to follow up with GI though he did try to call.  Pt last blood work showed mild elevated pancrease. Pt states no recent alcohol use.   Hx of anemia. Last cbc checked anemia but stable.'  Fmla forms to be filled out for recent hospitalizations and gastric ulcer.    Pt is taking tylenol for pain. I had tried to rx tramadol to help but he states he never filled. Advise not to use any nsaids. At times pain has 7-8/10 with acitivity. At times pain can be close to 10 level.     Review of Systems  Constitutional:  Negative for chills, fatigue and fever.  Respiratory:  Negative for cough, chest tightness, shortness of breath and wheezing.   Cardiovascular:  Negative for chest pain and palpitations.  Gastrointestinal:  Negative for abdominal distention, abdominal pain, blood in stool, constipation, diarrhea, nausea and vomiting.  Musculoskeletal:  Negative for back pain.  Skin:  Negative for rash.  Neurological:  Negative for dizziness, seizures, syncope, weakness and headaches.  Hematological:  Negative for adenopathy. Does not bruise/bleed easily.  Psychiatric/Behavioral:  Negative for behavioral problems and confusion.     Past Medical History:  Diagnosis Date   Hypertension      Social History   Socioeconomic History   Marital status: Legally Separated    Spouse name: Not on file   Number of children: Not on file   Years of education: Not on file   Highest education level: Not on file  Occupational History   Not on file  Tobacco Use   Smoking status: Former    Packs/day: 0.25    Years: 18.00    Pack years: 4.50    Types: Cigarettes    Smokeless tobacco: Never  Vaping Use   Vaping Use: Never used  Substance and Sexual Activity   Alcohol use: Not Currently   Drug use: Never   Sexual activity: Not on file  Other Topics Concern   Not on file  Social History Narrative   Not on file   Social Determinants of Health   Financial Resource Strain: Not on file  Food Insecurity: Not on file  Transportation Needs: Not on file  Physical Activity: Not on file  Stress: Not on file  Social Connections: Not on file  Intimate Partner Violence: Not on file    Past Surgical History:  Procedure Laterality Date   ABDOMINAL SURGERY     BIOPSY  10/21/2020   Procedure: BIOPSY;  Surgeon: Jerene Bears, MD;  Location: [facility] ENDOSCOPY;  Service: Gastroenterology;;   ESOPHAGOGASTRODUODENOSCOPY (EGD) WITH PROPOFOL N/A 10/21/2020   Procedure: ESOPHAGOGASTRODUODENOSCOPY (EGD) WITH PROPOFOL;  Surgeon: Jerene Bears, MD;  Location: [place];  Service: Gastroenterology;  Laterality: N/A;   HEMOSTASIS CLIP PLACEMENT  10/21/2020   Procedure: HEMOSTASIS CLIP PLACEMENT;  Surgeon: Jerene Bears, MD;  Location: [facility];  Service: Gastroenterology;;    Family History  Problem Relation Age of Onset   Cervical cancer Mother    Alzheimer's disease Father     No Known Allergies  Current Outpatient Medications on File Prior to Visit  Medication Sig Dispense Refill   Ascorbic Acid (VITAMIN C) 1000 MG tablet Take 1,000 mg by mouth daily.     B Complex-C (SUPER B COMPLEX PO) Take 1 tablet by mouth daily.     Camphor-Menthol-Methyl Sal (TIGER BALM MUSCLE RUB EX) Apply 1 application topically as needed (back pain).     Cholecalciferol (VITAMIN D3) 50 MCG (2000 UT) capsule Take 2,000 Units by mouth daily.     diclofenac (VOLTAREN) 75 MG EC tablet Take 75 mg by mouth 2 (two) times daily.     lidocaine (LIDODERM) 5 % Place 1 patch onto the skin daily as needed (back pain). Remove & Discard patch within 12 hours or as directed by MD     lisinopril  (ZESTRIL) 10 MG tablet Take 1 tablet (10 mg total) by mouth daily. 30 tablet 0   metoprolol succinate (TOPROL-XL) 50 MG 24 hr tablet Take 1 tablet (50 mg total) by mouth daily. TAKE WITH OR IMMEDIATELY FOLLOWING A MEAL. 30 tablet 0   Misc Natural Products (OSTEO BI-FLEX ADV TRIPLE ST PO) Take 1 tablet by mouth daily.     OVER THE COUNTER MEDICATION Take 1 tablet by mouth daily. GNC MEGA MEN ENERGY AND METABOBLISM     pantoprazole (PROTONIX) 40 MG tablet Take 1 tablet (40 mg total) by mouth 2 (two) times daily before a meal. 60 tablet 0   polyvinyl alcohol (LIQUIFILM TEARS) 1.4 % ophthalmic solution Place 1 drop into both eyes as needed for dry eyes.     Turmeric Curcumin 500 MG CAPS Take 1 capsule by mouth daily.     vitamin E 180 MG (400 UNITS) capsule Take 400 Units by mouth daily.     No current facility-administered medications on file prior to visit.    BP 124/78 (BP Location: Left Arm, Patient Position: Sitting, Cuff Size: Normal)   Pulse 97   Temp 98.4 F (36.9 C) (Oral)   Resp 18   Ht '5\' 9"'$  (1.753 m)   Wt 123 lb (55.8 kg)   SpO2 (!) 85%   BMI 18.16 kg/m       Objective:   Physical Exam  General Mental Status- Alert. General Appearance- Not in acute distress.   Skin General: Color- Normal Color. Moisture- Normal Moisture.  Neck Carotid Arteries- Normal color. Moisture- Normal Moisture. No carotid bruits. No JVD.  Chest and Lung Exam Auscultation: Breath Sounds:-Normal.  Cardiovascular Auscultation:Rythm- Regular. Murmurs & Other Heart Sounds:Auscultation of the heart reveals- No Murmurs.  Abdomen Inspection:-Inspeection Normal. Palpation/Percussion:Note:No mass. Palpation and Percussion of the abdomen reveal- Non Tender, Non Distended + BS, no rebound or guarding.    Neurologic Cranial Nerve exam:- CN III-XII intact(No nystagmus), symmetric smile. Drift Test:- No drift. Romberg Exam:- Negative.  Heal to Toe Gait exam:-Normal. Finger to Nose:-  Normal/Intact Strength:- 5/5 equal and symmetric strength both upper and lower extremities.       Assessment & Plan:   istory of GERD, gastric ulcer, anemia and mild pancreatitis.  Continue on Protonix, bland healthy foods and avoid all alcohol.  We will repeat your CBC, CMP and lipase level today.  Please reach out to GI office again for follow-up appointment.  If you are unable to schedule appointment please call our office and update me.  For moderate to severe neck pain follow modified work duty per [employer]. MD.  Can use Tylenol for neck pain and provided limited prescription of tramadol for moderate to  severe pain.  Rx advisement given regarding tramadol side effects.  Filled out separate FMLA paperwork today specifically for recent hospitalization and GI issues.  FMLA forms need to be filled out related to neck pain and Worker's Comp./orthopedic can fill out.  Would defer that to specialist.  Follow-up in 1 month or sooner if needed.  Mackie Pai, PA-C   Time spent with patient today was 33 minutes which consisted of chart review, discussing diagnosis, work up, treatment and documentation.

## 2020-11-11 LAB — COMPREHENSIVE METABOLIC PANEL
ALT: 18 U/L (ref 0–53)
AST: 21 U/L (ref 0–37)
Albumin: 3.8 g/dL (ref 3.5–5.2)
Alkaline Phosphatase: 53 U/L (ref 39–117)
BUN: 15 mg/dL (ref 6–23)
CO2: 27 mEq/L (ref 19–32)
Calcium: 9.1 mg/dL (ref 8.4–10.5)
Chloride: 107 mEq/L (ref 96–112)
Creatinine, Ser: 1.04 mg/dL (ref 0.40–1.50)
GFR: 77.29 mL/min (ref >60.00)
Glucose, Bld: 62 mg/dL — ABNORMAL LOW (ref 70–99)
Potassium: 3.9 mEq/L (ref 3.5–5.1)
Sodium: 141 mEq/L (ref 135–145)
Total Bilirubin: 0.4 mg/dL (ref 0.2–1.2)
Total Protein: 6.3 g/dL (ref 6.0–8.3)

## 2020-11-11 LAB — CBC WITH DIFFERENTIAL/PLATELET
Basophils Absolute: 0 10*3/uL (ref 0.0–0.1)
Basophils Relative: 0.6 % (ref 0.0–3.0)
Eosinophils Absolute: 0.1 10*3/uL (ref 0.0–0.7)
Eosinophils Relative: 2.4 % (ref 0.0–5.0)
HCT: 34.1 % — ABNORMAL LOW (ref 39.0–52.0)
Hemoglobin: 11.5 g/dL — ABNORMAL LOW (ref 13.0–17.0)
Lymphocytes Relative: 20.3 % (ref 12.0–46.0)
Lymphs Abs: 1 10*3/uL (ref 0.7–4.0)
MCHC: 33.6 g/dL (ref 30.0–36.0)
MCV: 88 fl (ref 78.0–100.0)
Monocytes Absolute: 0.5 10*3/uL (ref 0.1–1.0)
Monocytes Relative: 9.8 % (ref 3.0–12.0)
Neutro Abs: 3.3 10*3/uL (ref 1.4–7.7)
Neutrophils Relative %: 66.9 % (ref 43.0–77.0)
Platelets: 364 10*3/uL (ref 150.0–400.0)
RBC: 3.88 Mil/uL — ABNORMAL LOW (ref 4.22–5.81)
RDW: 16 % — ABNORMAL HIGH (ref 11.5–15.5)
WBC: 4.9 10*3/uL (ref 4.0–10.5)

## 2020-11-11 LAB — IRON: Iron: 44 ug/dL (ref 42–165)

## 2020-11-11 LAB — LIPASE: Lipase: 72 U/L — ABNORMAL HIGH (ref 11.0–59.0)

## 2020-11-12 ENCOUNTER — Encounter: Payer: Self-pay | Admitting: Internal Medicine

## 2020-11-12 ENCOUNTER — Telehealth: Payer: Self-pay | Admitting: Medical

## 2020-11-12 NOTE — Telephone Encounter (Signed)
Pt is seeing Dr Hilarie Fredrickson on 10/27 at 10:30am.

## 2020-11-12 NOTE — Telephone Encounter (Signed)
This is the pt's GI provider.

## 2020-11-13 NOTE — Addendum Note (Signed)
Addended by: Anabel Halon on: 11/13/2020 09:31 AM   Modules accepted: Orders

## 2020-11-17 ENCOUNTER — Encounter: Payer: Self-pay | Admitting: *Deleted

## 2020-11-26 ENCOUNTER — Telehealth: Payer: Self-pay | Admitting: Medical

## 2020-11-26 MED ORDER — METOPROLOL SUCCINATE ER 50 MG PO TB24: 50.0000 mg | ORAL_TABLET | Freq: Every day | ORAL | 0 refills | Status: DC

## 2020-11-26 MED ORDER — LISINOPRIL 10 MG PO TABS: 10.0000 mg | ORAL_TABLET | Freq: Every day | ORAL | 0 refills | Status: DC

## 2020-11-26 NOTE — Telephone Encounter (Signed)
Medication:   lisinopril (ZESTRIL) 10 MG tablet [id number] metoprolol succinate (TOPROL-XL) 50 MG 24 hr tablet  Has the patient contacted their pharmacy? No. (If no, request that the patient contact the pharmacy for the refill.) (If yes, when and what did the pharmacy advise?)  Preferred Pharmacy (with phone number or street name):  [facility] [address]  [address], [place] [address]  Phone:  [phone]  Fax:  [phone]  Agent: Please be advised that RX refills may take up to 3 business days. We ask that you follow-up with your pharmacy.

## 2020-11-26 NOTE — Telephone Encounter (Signed)
Rx 7 day lisinopril and metoprolol. I have never written this for her. Don't want her to run out but need to know what type of MD wrote prior rx? Will they be managing in future. Or did former pcp give one last refill early august.   Want to know who will be expected to refill in future. Let me know. If prior pcp gave last refill then will pick up and give monthly refills. Also ask her to come in within next 2 weeks for bp check.

## 2020-11-26 NOTE — Telephone Encounter (Signed)
Is it okay to refill even though it was prescribed by Damita Lack, MD

## 2020-11-29 NOTE — Telephone Encounter (Signed)
Pt called and lvm to return call 

## 2020-12-02 NOTE — Telephone Encounter (Signed)
Pt has an appointment on 12/10/20

## 2020-12-03 ENCOUNTER — Other Ambulatory Visit: Payer: Self-pay

## 2020-12-03 ENCOUNTER — Telehealth: Payer: Self-pay | Admitting: Medical

## 2020-12-03 MED ORDER — LISINOPRIL 10 MG PO TABS: 10.0000 mg | ORAL_TABLET | Freq: Every day | ORAL | 0 refills | Status: DC

## 2020-12-03 MED ORDER — METOPROLOL SUCCINATE ER 50 MG PO TB24: 50.0000 mg | ORAL_TABLET | Freq: Every day | ORAL | 0 refills | Status: DC

## 2020-12-03 NOTE — Telephone Encounter (Signed)
7 day supply of BP meds sent to pharmacy , can discuss 30/90 day supply  and tramadol at appointment 09/23

## 2020-12-03 NOTE — Telephone Encounter (Signed)
Medication:  traMADol (ULTRAM) 50 MG tablet  lisinopril (ZESTRIL) 10 MG tablet  metoprolol succinate (TOPROL-XL) 50 MG 24 hr tablet  Has the patient contacted their pharmacy? No. (If no, request that the patient contact the pharmacy for the refill.) (If yes, when and what did the pharmacy advise?)  Preferred Pharmacy (with phone number or street name): [facility] [address]  [address], [place] [address]  Phone:  [phone]   Agent: Please be advised that RX refills may take up to 3 business days. We ask that you follow-up with your pharmacy.

## 2020-12-10 ENCOUNTER — Other Ambulatory Visit: Payer: Self-pay

## 2020-12-10 ENCOUNTER — Ambulatory Visit (INDEPENDENT_AMBULATORY_CARE_PROVIDER_SITE_OTHER): Payer: Self-pay | Admitting: Medical

## 2020-12-10 VITALS — BP 170/90 | HR 66 | Temp 98.2°F | Resp 18 | Ht 69.0 in | Wt 123.6 lb

## 2020-12-10 DIAGNOSIS — I10 Essential (primary) hypertension: Secondary | ICD-10-CM

## 2020-12-10 DIAGNOSIS — M542 Cervicalgia: Secondary | ICD-10-CM

## 2020-12-10 MED ORDER — CHLORTHALIDONE 25 MG PO TABS: 25.0000 mg | ORAL_TABLET | Freq: Every day | ORAL | 0 refills | Status: DC

## 2020-12-10 MED ORDER — METOPROLOL SUCCINATE ER 50 MG PO TB24: 50.0000 mg | ORAL_TABLET | Freq: Every day | ORAL | 11 refills | Status: DC

## 2020-12-10 MED ORDER — LISINOPRIL 10 MG PO TABS: 10.0000 mg | ORAL_TABLET | Freq: Every day | ORAL | 11 refills | Status: DC

## 2020-12-10 MED ORDER — TRAMADOL HCL 50 MG PO TABS: 50.0000 mg | ORAL_TABLET | Freq: Four times a day (QID) | ORAL | 0 refills | Status: DC | PRN

## 2020-12-10 MED ORDER — TRAMADOL HCL 50 MG PO TABS: 50.0000 mg | ORAL_TABLET | Freq: Four times a day (QID) | ORAL | 0 refills | Status: AC | PRN

## 2020-12-10 NOTE — Progress Notes (Signed)
Subjective:    Patient ID: Nathaniel Williams, male    DOB: January 02, 1961, 60 y.o.   MRN: [id number]  HPI  Pt in for follow up.  Pt has neck pain. Last week pain flared after he ran out of tramadol. Limited option/can't rx nsaids due to GI ulcer. Pt has been seen by orthopedist. Last time saw orthopedist was July. Pt states xray and mri done.   Pt states fell in chair and hurt neck around spring.   Mri 08/05/2020.   1.  Cervical degenerative disc disease most significant at C5-6 with disc osteophyte causing mild canal stenosis with moderate left neural foraminal narrowing.   2.  C7-T1 left-sided radicular disc extrusion causing severe left neural foraminal narrowing.    Pt has htn. He called in and staff cave limited supply until he could come in. Pt bp is very high. He attributes this very high bp to pain level. He states ran out of metoprolol over the weekend. Got refill on Monday.   Tramadol in past did stop pain.     Review of Systems  Constitutional:  Negative for chills, fatigue and fever.  Respiratory:  Negative for cough, choking, chest tightness, shortness of breath and wheezing.   Cardiovascular:  Negative for chest pain and palpitations.  Gastrointestinal:  Negative for abdominal pain.  Musculoskeletal:  Positive for neck pain.  Neurological:  Negative for dizziness, syncope, weakness, light-headedness, numbness and headaches.  Hematological:  Negative for adenopathy. Does not bruise/bleed easily.  Psychiatric/Behavioral:  Negative for behavioral problems, confusion and hallucinations. The patient is not nervous/anxious.    Past Medical History:  Diagnosis Date   Achalasia 1996   Dx in [place] s/p Heller Myotomy w/gastric pull-thru   Alcohol use    Aspiration pneumonia ([place])    Cervical spondylosis    Gastric ulcer    Gastritis    GI bleed    H/O hydrocele    of scrotum   Hypertension    IDA (iron deficiency anemia)      Social History   Socioeconomic  History   Marital status: Legally Separated    Spouse name: Not on file   Number of children: Not on file   Years of education: Not on file   Highest education level: Not on file  Occupational History   Not on file  Tobacco Use   Smoking status: Former    Packs/day: 0.25    Years: 18.00    Pack years: 4.50    Types: Cigarettes   Smokeless tobacco: Never  Vaping Use   Vaping Use: Never used  Substance and Sexual Activity   Alcohol use: Not Currently    Comment: 1.5 L rum every 10 days (per hospital record 10/21/20)   Drug use: Never   Sexual activity: Not on file  Other Topics Concern   Not on file  Social History Narrative   Not on file   Social Determinants of Health   Financial Resource Strain: Not on file  Food Insecurity: Not on file  Transportation Needs: Not on file  Physical Activity: Not on file  Stress: Not on file  Social Connections: Not on file  Intimate Partner Violence: Not on file    Past Surgical History:  Procedure Laterality Date   BIOPSY  10/21/2020   Procedure: BIOPSY;  Surgeon: Jerene Bears, MD;  Location: [facility] ENDOSCOPY;  Service: Gastroenterology;;   ESOPHAGOGASTRODUODENOSCOPY (EGD) WITH PROPOFOL N/A 10/21/2020   Procedure: ESOPHAGOGASTRODUODENOSCOPY (EGD) WITH PROPOFOL;  Surgeon: Zenovia Jarred  M, MD;  Location: [place];  Service: Gastroenterology;  Laterality: N/A;   [place]   in [place]; w/gastric pull thru   [place]  10/21/2020   Procedure: HEMOSTASIS CLIP PLACEMENT;  Surgeon: Jerene Bears, MD;  Location: [facility] ENDOSCOPY;  Service: Gastroenterology;;    Family History  Problem Relation Age of Onset   Cervical cancer Mother    Alzheimer's disease Father     No Known Allergies  Current Outpatient Medications on File Prior to Visit  Medication Sig Dispense Refill   Ascorbic Acid (VITAMIN C) 1000 MG tablet Take 1,000 mg by mouth daily.     B Complex-C (SUPER B COMPLEX PO) Take 1 tablet by mouth daily.      Camphor-Menthol-Methyl Sal (TIGER BALM MUSCLE RUB EX) Apply 1 application topically as needed (back pain).     Cholecalciferol (VITAMIN D3) 50 MCG (2000 UT) capsule Take 2,000 Units by mouth daily.     diclofenac (VOLTAREN) 75 MG EC tablet Take 75 mg by mouth 2 (two) times daily.     lidocaine (LIDODERM) 5 % Place 1 patch onto the skin daily as needed (back pain). Remove & Discard patch within 12 hours or as directed by MD     lisinopril (ZESTRIL) 10 MG tablet Take 1 tablet (10 mg total) by mouth daily. 7 tablet 0   metoprolol succinate (TOPROL-XL) 50 MG 24 hr tablet Take 1 tablet (50 mg total) by mouth daily. TAKE WITH OR IMMEDIATELY FOLLOWING A MEAL. 7 tablet 0   Misc Natural Products (OSTEO BI-FLEX ADV TRIPLE ST PO) Take 1 tablet by mouth daily.     OVER THE COUNTER MEDICATION Take 1 tablet by mouth daily. GNC MEGA MEN ENERGY AND METABOBLISM     pantoprazole (PROTONIX) 40 MG tablet Take 1 tablet (40 mg total) by mouth 2 (two) times daily before a meal. 60 tablet 0   polyvinyl alcohol (LIQUIFILM TEARS) 1.4 % ophthalmic solution Place 1 drop into both eyes as needed for dry eyes.     Turmeric Curcumin 500 MG CAPS Take 1 capsule by mouth daily.     vitamin E 180 MG (400 UNITS) capsule Take 400 Units by mouth daily.     No current facility-administered medications on file prior to visit.    BP (!) 170/90   Pulse 66   Temp 98.2 F (36.8 C)   Resp 18   Ht 5\' 9"  (1.753 m)   Wt 123 lb 9.6 oz (56.1 kg)   SpO2 100%   BMI 18.25 kg/m        Objective:   Physical Exam  General Mental Status- Alert. General Appearance- Not in acute distress.   Skin General: Color- Normal Color. Moisture- Normal Moisture.  Neck On  turing head to rt and left 9/10.   Chest and Lung Exam Auscultation: Breath Sounds:-Normal.  Cardiovascular Auscultation:Rythm- Regular. Murmurs & Other Heart Sounds:Auscultation of the heart reveals- No Murmurs.  Abdomen Inspection:-Inspeection  Normal. Palpation/Percussion:Note:No mass. Palpation and Percussion of the abdomen reveal- Non Tender, Non Distended + BS, no rebound or guarding.   Neurologic Cranial Nerve exam:- CN III-XII intact(No nystagmus), symmetric smile. Romberg Exam:- Negative. Heal to Toe Gait exam:-Normal. Strength:- 5/5 equal and symmetric strength both upper and lower extremities.       Assessment & Plan:   Patient Instructions  History of severe neck pain.  Seen by orthopedist in the past but no formally scheduled follow-up since last visit.  On review you  do have significant chronic findings on prior/recent MRI.  Limited choices for meds due to gastric ulcer history.  I refilled her tramadol today.  You can use max 1 every 6 hours as needed for severe pain.  Use sparingly if able to get by with minimal use.  On follow-up might need to go ahead and have you be on controlled med contract and get UDS.  I first want you to contact your orthopedist to see what next steps to them might be.  History of hypertension with recent missed dosages just of beta-blocker over the weekend.  Currently he had both lisinopril and metoprolol.  Will add chlorthalidone 25 mg to use 1 tablet daily.  Eat high potassium diet while on diuretic.  Check blood pressures daily.  If your systolic blood pressure drops less than 120 let me know.  Asked for my chart update on daily blood pressure readings in 1 week.  Follow-up in 3 weeks or sooner if needed.

## 2020-12-10 NOTE — Patient Instructions (Signed)
History of severe neck pain.  Seen by orthopedist in the past but no formally scheduled follow-up since last visit.  On review you do have significant chronic findings on prior/recent MRI.  Limited choices for meds due to gastric ulcer history.  I refilled her tramadol today.  You can use max 1 every 6 hours as needed for severe pain.  Use sparingly if able to get by with minimal use.  On follow-up might need to go ahead and have you be on controlled med contract and get UDS.  I first want you to contact your orthopedist to see what next steps to them might be.  History of hypertension with recent missed dosages just of beta-blocker over the weekend.  Currently he had both lisinopril and metoprolol.  Will add chlorthalidone 25 mg to use 1 tablet daily.  Eat high potassium diet while on diuretic.  Check blood pressures daily.  If your systolic blood pressure drops less than 120 let me know.  Asked for my chart update on daily blood pressure readings in 1 week.  Follow-up in 3 weeks or sooner if needed.

## 2021-01-03 ENCOUNTER — Ambulatory Visit: Payer: Self-pay | Admitting: Medical

## 2021-01-05 ENCOUNTER — Encounter: Payer: Self-pay | Admitting: Medical

## 2021-01-05 ENCOUNTER — Ambulatory Visit (INDEPENDENT_AMBULATORY_CARE_PROVIDER_SITE_OTHER): Payer: Self-pay | Admitting: Medical

## 2021-01-05 ENCOUNTER — Other Ambulatory Visit: Payer: Self-pay

## 2021-01-05 VITALS — BP 130/70 | HR 70 | Temp 97.7°F | Resp 18 | Ht 69.0 in | Wt 123.0 lb

## 2021-01-05 DIAGNOSIS — M542 Cervicalgia: Secondary | ICD-10-CM

## 2021-01-05 DIAGNOSIS — I10 Essential (primary) hypertension: Secondary | ICD-10-CM

## 2021-01-05 DIAGNOSIS — K219 Gastro-esophageal reflux disease without esophagitis: Secondary | ICD-10-CM

## 2021-01-05 DIAGNOSIS — D649 Anemia, unspecified: Secondary | ICD-10-CM

## 2021-01-05 DIAGNOSIS — E876 Hypokalemia: Secondary | ICD-10-CM

## 2021-01-05 DIAGNOSIS — Z79899 Other long term (current) drug therapy: Secondary | ICD-10-CM

## 2021-01-05 LAB — COMPREHENSIVE METABOLIC PANEL
ALT: 12 U/L (ref 0–53)
AST: 21 U/L (ref 0–37)
Albumin: 4.3 g/dL (ref 3.5–5.2)
Alkaline Phosphatase: 48 U/L (ref 39–117)
BUN: 28 mg/dL — ABNORMAL HIGH (ref 6–23)
CO2: 31 mEq/L (ref 19–32)
Calcium: 9.4 mg/dL (ref 8.4–10.5)
Chloride: 99 mEq/L (ref 96–112)
Creatinine, Ser: 1.27 mg/dL (ref 0.40–1.50)
GFR: 60.75 mL/min (ref >60.00)
Glucose, Bld: 104 mg/dL — ABNORMAL HIGH (ref 70–99)
Potassium: 3 mEq/L — ABNORMAL LOW (ref 3.5–5.1)
Sodium: 138 mEq/L (ref 135–145)
Total Bilirubin: 0.6 mg/dL (ref 0.2–1.2)
Total Protein: 7 g/dL (ref 6.0–8.3)

## 2021-01-05 LAB — CBC WITH DIFFERENTIAL/PLATELET
Basophils Absolute: 0 10*3/uL (ref 0.0–0.1)
Basophils Relative: 0.5 % (ref 0.0–3.0)
Eosinophils Absolute: 0.1 10*3/uL (ref 0.0–0.7)
Eosinophils Relative: 2.9 % (ref 0.0–5.0)
HCT: 35.7 % — ABNORMAL LOW (ref 39.0–52.0)
Hemoglobin: 11.6 g/dL — ABNORMAL LOW (ref 13.0–17.0)
Lymphocytes Relative: 30 % (ref 12.0–46.0)
Lymphs Abs: 1.4 10*3/uL (ref 0.7–4.0)
MCHC: 32.5 g/dL (ref 30.0–36.0)
MCV: 84 fl (ref 78.0–100.0)
Monocytes Absolute: 0.5 10*3/uL (ref 0.1–1.0)
Monocytes Relative: 11.3 % (ref 3.0–12.0)
Neutro Abs: 2.6 10*3/uL (ref 1.4–7.7)
Neutrophils Relative %: 55.3 % (ref 43.0–77.0)
Platelets: 199 10*3/uL (ref 150.0–400.0)
RBC: 4.25 Mil/uL (ref 4.22–5.81)
RDW: 16.7 % — ABNORMAL HIGH (ref 11.5–15.5)
WBC: 4.7 10*3/uL (ref 4.0–10.5)

## 2021-01-05 LAB — IRON: Iron: 117 ug/dL (ref 42–165)

## 2021-01-05 MED ORDER — TRAMADOL HCL 50 MG PO TABS: ORAL_TABLET | ORAL | 0 refills | Status: DC

## 2021-01-05 NOTE — Progress Notes (Signed)
Subjective:    Patient ID: Nathaniel Williams, male    DOB: 12-29-1960, 60 y.o.   MRN: [id number]  HPI  Pt has neck pain. Last week pain flared after he ran out of tramadol. Limited option/can't rx nsaids due to GI ulcer. Pt has been seen by orthopedist. Last time saw orthopedist was July. Pt states xray and mri done.    Pt states fell in chair and hurt neck around spring.     Mri 08/05/2020.     1.  Cervical degenerative disc disease most significant at C5-6 with disc osteophyte causing mild canal stenosis with moderate left neural foraminal narrowing.   2.  C7-T1 left-sided radicular disc extrusion causing severe left neural foraminal narrowing.    Pt updates me that in a month he will get insurance thru his work.    Last time pt saw workers comp provider was in June or July. Maybe case closed?   Hx of htn. Pt bp is elevated initially but on recheck by myself 130/80. Pt is on lisinopril 10 mg daily, toprol xl-50 mg daily and chlorthalidone 25 mg daily.  Pt states he is going to reschedule his gi appointment until next month since no insurance yet. Appointment for GI ulcer and anemia.     Review of Systems  Constitutional:  Negative for chills, fatigue and fever.  HENT:  Negative for congestion, ear discharge and ear pain.   Respiratory:  Negative for cough, choking and wheezing.   Cardiovascular:  Negative for chest pain and palpitations.  Gastrointestinal:  Negative for abdominal pain, blood in stool, constipation, nausea and vomiting.  Genitourinary:  Negative for frequency.  Musculoskeletal:  Positive for neck pain. Negative for back pain.  Neurological:  Negative for dizziness and headaches.  Hematological:  Negative for adenopathy. Does not bruise/bleed easily.  Psychiatric/Behavioral:  Negative for behavioral problems.    Past Medical History:  Diagnosis Date   Achalasia 1996   Dx in [place] s/p Heller Myotomy w/gastric pull-thru   Alcohol use    Aspiration  pneumonia ([place])    Cervical spondylosis    Gastric ulcer    Gastritis    GI bleed    H/O hydrocele    of scrotum   Hypertension    IDA (iron deficiency anemia)      Social History   Socioeconomic History   Marital status: Legally Separated    Spouse name: Not on file   Number of children: Not on file   Years of education: Not on file   Highest education level: Not on file  Occupational History   Not on file  Tobacco Use   Smoking status: Former    Packs/day: 0.25    Years: 18.00    Pack years: 4.50    Types: Cigarettes   Smokeless tobacco: Never  Vaping Use   Vaping Use: Never used  Substance and Sexual Activity   Alcohol use: Not Currently    Comment: 1.5 L rum every 10 days (per hospital record 10/21/20)   Drug use: Never   Sexual activity: Not on file  Other Topics Concern   Not on file  Social History Narrative   Not on file   Social Determinants of Health   Financial Resource Strain: Not on file  Food Insecurity: Not on file  Transportation Needs: Not on file  Physical Activity: Not on file  Stress: Not on file  Social Connections: Not on file  Intimate Partner Violence: Not on file  Past Surgical History:  Procedure Laterality Date   BIOPSY  10/21/2020   Procedure: BIOPSY;  Surgeon: Jerene Bears, MD;  Location: [facility] ENDOSCOPY;  Service: Gastroenterology;;   ESOPHAGOGASTRODUODENOSCOPY (EGD) WITH PROPOFOL N/A 10/21/2020   Procedure: ESOPHAGOGASTRODUODENOSCOPY (EGD) WITH PROPOFOL;  Surgeon: Jerene Bears, MD;  Location: [place];  Service: Gastroenterology;  Laterality: N/A;   [place]   in [place]; w/gastric pull thru   [place]  10/21/2020   Procedure: HEMOSTASIS CLIP PLACEMENT;  Surgeon: Jerene Bears, MD;  Location: [facility] ENDOSCOPY;  Service: Gastroenterology;;    Family History  Problem Relation Age of Onset   Cervical cancer Mother    Alzheimer's disease Father     No Known Allergies  Current Outpatient  Medications on File Prior to Visit  Medication Sig Dispense Refill   Ascorbic Acid (VITAMIN C) 1000 MG tablet Take 1,000 mg by mouth daily.     B Complex-C (SUPER B COMPLEX PO) Take 1 tablet by mouth daily.     Camphor-Menthol-Methyl Sal (TIGER BALM MUSCLE RUB EX) Apply 1 application topically as needed (back pain).     chlorthalidone (HYGROTON) 25 MG tablet Take 1 tablet (25 mg total) by mouth daily. 30 tablet 0   Cholecalciferol (VITAMIN D3) 50 MCG (2000 UT) capsule Take 2,000 Units by mouth daily.     diclofenac (VOLTAREN) 75 MG EC tablet Take 75 mg by mouth 2 (two) times daily.     lidocaine (LIDODERM) 5 % Place 1 patch onto the skin daily as needed (back pain). Remove & Discard patch within 12 hours or as directed by MD     lisinopril (ZESTRIL) 10 MG tablet Take 1 tablet (10 mg total) by mouth daily. 30 tablet 11   metoprolol succinate (TOPROL-XL) 50 MG 24 hr tablet Take 1 tablet (50 mg total) by mouth daily. TAKE WITH OR IMMEDIATELY FOLLOWING A MEAL. 30 tablet 11   Misc Natural Products (OSTEO BI-FLEX ADV TRIPLE ST PO) Take 1 tablet by mouth daily.     OVER THE COUNTER MEDICATION Take 1 tablet by mouth daily. [employer] MEGA MEN ENERGY AND METABOBLISM     pantoprazole (PROTONIX) 40 MG tablet Take 1 tablet (40 mg total) by mouth 2 (two) times daily before a meal. 60 tablet 0   polyvinyl alcohol (LIQUIFILM TEARS) 1.4 % ophthalmic solution Place 1 drop into both eyes as needed for dry eyes.     Turmeric Curcumin 500 MG CAPS Take 1 capsule by mouth daily.     vitamin E 180 MG (400 UNITS) capsule Take 400 Units by mouth daily.     No current facility-administered medications on file prior to visit.    BP (!) 158/94   Pulse 70   Temp 97.7 F (36.5 C)   Resp 18   Ht 5\' 9"  (1.753 m)   Wt 123 lb (55.8 kg)   SpO2 100%   BMI 18.16 kg/m        Objective:   Physical Exam  General Mental Status- Alert. General Appearance- Not in acute distress.   Skin General: Color- Normal Color. Moisture-  Normal Moisture.  Neck Posterior aspect mild pain on palpation presently.  Chest and Lung Exam Auscultation: Breath Sounds:-Normal.  Cardiovascular Auscultation:Rythm- Regular. Murmurs & Other Heart Sounds:Auscultation of the heart reveals- No Murmurs.  Abdomen Inspection:-Inspeection Normal. Palpation/Percussion:Note:No mass. Palpation and Percussion of the abdomen reveal- Non Tender, Non Distended + BS, no rebound or guarding.   Neurologic Cranial Nerve exam:- CN  III-XII intact(No nystagmus), symmetric smile. Strength:- 5/5 equal and symmetric strength both upper and lower extremities.   Upper ext- equal grip strength 5/5.         Assessment & Plan:   Patient Instructions  Hypertension.  On BP recheck blood pressure well controlled today.  Continue lisinopril, Toprol-XL and chlorthalidone.  Neck pain moderate to severe at times.  Some significant findings on MRI.  However no radicular pain in the upper extremities or upper extremity weakness.  After discussion decided to prescribe tramadol 50 mg twice daily.  We will have you give urine drug screen today and sign controlled med contract.  Would asked that you find out.  For [employer] closing your case?  Also need to follow you closely and if you are having any radicular pain or upper extremity weakness could refer you to a neurosurgeon in the future.  GERD, history of ulcer and anemia.  You are pushing back your GI MD appointment date.  So we will get CBC, iron level and a metabolic panel today.  Follow-up in 6 weeks or sooner if needed.   Mackie Pai, PA-C   Time spent with patient today was  30 minutes which consisted of chart review, discussing diagnosis, work up treatment and documentation.

## 2021-01-05 NOTE — Patient Instructions (Addendum)
Hypertension.  On BP recheck blood pressure well controlled today.  Continue lisinopril, Toprol-XL and chlorthalidone.  Neck pain moderate to severe at times.  Some significant findings on MRI.  However no radicular pain in the upper extremities or upper extremity weakness.  After discussion decided to prescribe tramadol 50 mg twice daily.  We will have you give urine drug screen today and sign controlled med contract.  Would asked that you find out.  For [employer] closing your case?  Also need to follow you closely and if you are having any radicular pain or upper extremity weakness could refer you to a neurosurgeon in the future.  GERD, history of ulcer and anemia.  You are pushing back your GI MD appointment date.  So we will get CBC, iron level and a metabolic panel today.  Follow-up in 6 weeks or sooner if needed.

## 2021-01-06 MED ORDER — POTASSIUM CHLORIDE CRYS ER 10 MEQ PO TBCR: 10.0000 meq | EXTENDED_RELEASE_TABLET | Freq: Every day | ORAL | 1 refills | Status: DC

## 2021-01-06 NOTE — Addendum Note (Signed)
Addended by: Anabel Halon on: 01/06/2021 08:02 PM   Modules accepted: Orders

## 2021-01-09 LAB — DRUG MONITORING PANEL 375977 , URINE
Alcohol Metabolites: POSITIVE ng/mL — AB (ref <500)
Amphetamines: NEGATIVE ng/mL (ref <500)
Barbiturates: NEGATIVE ng/mL (ref <300)
Benzodiazepines: NEGATIVE ng/mL (ref <100)
Cocaine Metabolite: NEGATIVE ng/mL (ref <150)
Desmethyltramadol: NEGATIVE ng/mL (ref <100)
Ethyl Glucuronide (ETG): 3635 ng/mL — ABNORMAL HIGH (ref <500)
Ethyl Sulfate (ETS): 2338 ng/mL — ABNORMAL HIGH (ref <100)
Marijuana Metabolite: NEGATIVE ng/mL (ref <20)
Opiates: NEGATIVE ng/mL (ref <100)
Oxycodone: NEGATIVE ng/mL (ref <100)
Tramadol: NEGATIVE ng/mL (ref <100)

## 2021-01-09 LAB — DM TEMPLATE

## 2021-01-13 ENCOUNTER — Ambulatory Visit: Payer: Medicaid Other | Admitting: Internal Medicine

## 2021-02-16 ENCOUNTER — Encounter: Payer: Self-pay | Admitting: Medical

## 2021-02-16 ENCOUNTER — Ambulatory Visit (INDEPENDENT_AMBULATORY_CARE_PROVIDER_SITE_OTHER): Payer: Self-pay | Admitting: Medical

## 2021-02-16 VITALS — BP 178/98 | HR 69 | Resp 18 | Ht 69.0 in | Wt 120.4 lb

## 2021-02-16 DIAGNOSIS — D649 Anemia, unspecified: Secondary | ICD-10-CM

## 2021-02-16 DIAGNOSIS — I10 Essential (primary) hypertension: Secondary | ICD-10-CM

## 2021-02-16 DIAGNOSIS — M542 Cervicalgia: Secondary | ICD-10-CM

## 2021-02-16 DIAGNOSIS — K219 Gastro-esophageal reflux disease without esophagitis: Secondary | ICD-10-CM

## 2021-02-16 MED ORDER — LISINOPRIL 10 MG PO TABS: ORAL_TABLET | ORAL | 0 refills | Status: DC

## 2021-02-16 MED ORDER — AMLODIPINE BESYLATE 5 MG PO TABS: 5.0000 mg | ORAL_TABLET | Freq: Every day | ORAL | 0 refills | Status: DC

## 2021-02-16 MED ORDER — PANTOPRAZOLE SODIUM 40 MG PO TBEC: 40.0000 mg | DELAYED_RELEASE_TABLET | Freq: Two times a day (BID) | ORAL | 3 refills | Status: DC

## 2021-02-16 MED ORDER — HYDROCODONE-ACETAMINOPHEN 5-325 MG PO TABS: 1.0000 | ORAL_TABLET | Freq: Four times a day (QID) | ORAL | 0 refills | Status: DC | PRN

## 2021-02-16 NOTE — Patient Instructions (Signed)
Chronic neck pain associated with cervical degenerative disc disease, mild stenosis and neuroforaminal narrowing.  Unfortunately pain has been severe over the last 2 days and seems not to be responding to tramadol.  Previously on a contract for tramadol.  Not giving tramadol refill since not working for pain presently.  We will give trial of 5 days of Norco.  Rx advisement given.  Particularly cautious on sedation side effect.  Plan to refer you to a neurosurgeon in January when you have medical insurance.  Also consider referral to pain management.   Blood pressure was very high initially and did come down on the second check.  However still quite high.  No neurologic signs or symptoms and normal neurologic exam.  Increasing her lisinopril to 20 mg daily and adding amlodipine 5 mg to BP med regimen.  Continue Toprol and chlorthalidone.  If with high blood pressures you have motor or neurologic deficits then be seen in the emergency department.  For history of GI bleed and gastritis refilled your Protonix.  Continue not to drink any alcohol.  Last CBC showed stable slightly improved hemoglobin hematocrit.  Follow-up with GI in December.  Follow-up 1 week or sooner if needed.

## 2021-02-16 NOTE — Addendum Note (Signed)
Addended by: Anabel Halon on: 02/16/2021 01:47 PM   Modules accepted: Orders

## 2021-02-16 NOTE — Progress Notes (Signed)
Subjective:    Patient ID: Nathaniel Williams, male    DOB: [birth year], 60 y.o.   MRN: [id number]  HPI  Pt has gerd. He is out of protonix. Needs refill.   Pt has htn. Last time bp was well controlled. Today very high initially. Pt is on lisinopril, toprol xl- and chlorthalidone. Pt has not checked his blood pressure recently.  Pt has neck pain and lower back pain. Pt states pain is daily. Family states pain worse when rains. Last 2 nights could not sleep. He called out from work yesterday. Pt states last time he saw workers comp was back in June or July. Pt had mri of his back. 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 He states no further work comp appointments.  Mri in epic showed    IMPRESSION:   1.  Cervical degenerative disc disease most significant at C5-6 with disc osteophyte causing mild canal stenosis with moderate left neural foraminal narrowing.   2.  C7-T1 left-sided radicular disc extrusion causing severe left neural foraminal narrowing.   Electronically Signed by: Ritta Slot on 08/05/2020 10:45 AM   Gastrointestinal hemorrhage associated with peptic ulcer  F10.11 (ICD-10-CM) - History of alcohol abuse  D62 (ICD-10-CM) - Acute blood loss anemia  He has appointment with gi MD  in December.   Pt has not been drinking any alcohol.   Review of Systems  Constitutional:  Negative for chills, fatigue and fever.  HENT:  Negative for congestion, ear discharge and ear pain.   Respiratory:  Negative for cough, choking and wheezing.   Cardiovascular:  Negative for chest pain and palpitations.  Gastrointestinal:  Negative for abdominal pain, blood in stool, constipation, nausea and vomiting.  Genitourinary:  Negative for frequency.  Musculoskeletal:  Positive for neck pain. Negative for back pain.  Skin:  Negative for rash.  Neurological:  Negative for dizziness, tremors, speech difficulty, weakness, numbness and headaches.       No radicular pain.  Hematological:  Negative for  adenopathy. Does not bruise/bleed easily.  Psychiatric/Behavioral:  Negative for behavioral problems and sleep disturbance. The patient is not nervous/anxious.     Past Medical History:  Diagnosis Date   Achalasia 1996   Dx in [place] s/p Heller Myotomy w/gastric pull-thru   Alcohol use    Aspiration pneumonia ([place])    Cervical spondylosis    Gastric ulcer    Gastritis    GI bleed    H/O hydrocele    of scrotum   Hypertension    IDA (iron deficiency anemia)      Social History   Socioeconomic History   Marital status: Legally Separated    Spouse name: Not on file   Number of children: Not on file   Years of education: Not on file   Highest education level: Not on file  Occupational History   Not on file  Tobacco Use   Smoking status: Former    Packs/day: 0.25    Years: 18.00    Pack years: 4.50    Types: Cigarettes   Smokeless tobacco: Never  Vaping Use   Vaping Use: Never used  Substance and Sexual Activity   Alcohol use: Not Currently    Comment: 1.5 L rum every 10 days (per hospital record 10/21/20)   Drug use: Never   Sexual activity: Not on file  Other Topics Concern   Not on file  Social History Narrative   Not on file   Social Determinants of Health   Financial  Resource Strain: Not on file  Food Insecurity: Not on file  Transportation Needs: Not on file  Physical Activity: Not on file  Stress: Not on file  Social Connections: Not on file  Intimate Partner Violence: Not on file    Past Surgical History:  Procedure Laterality Date   BIOPSY  10/21/2020   Procedure: BIOPSY;  Surgeon: Jerene Bears, MD;  Location: [facility] ENDOSCOPY;  Service: Gastroenterology;;   ESOPHAGOGASTRODUODENOSCOPY (EGD) WITH PROPOFOL N/A 10/21/2020   Procedure: ESOPHAGOGASTRODUODENOSCOPY (EGD) WITH PROPOFOL;  Surgeon: Jerene Bears, MD;  Location: [place];  Service: Gastroenterology;  Laterality: N/A;   [place]   in [place]; w/gastric pull thru   [place]  10/21/2020   Procedure: HEMOSTASIS CLIP PLACEMENT;  Surgeon: Jerene Bears, MD;  Location: [facility] ENDOSCOPY;  Service: Gastroenterology;;    Family History  Problem Relation Age of Onset   Cervical cancer Mother    Alzheimer's disease Father     No Known Allergies  Current Outpatient Medications on File Prior to Visit  Medication Sig Dispense Refill   Ascorbic Acid (VITAMIN C) 1000 MG tablet Take 1,000 mg by mouth daily.     B Complex-C (SUPER B COMPLEX PO) Take 1 tablet by mouth daily.     Camphor-Menthol-Methyl Sal (TIGER BALM MUSCLE RUB EX) Apply 1 application topically as needed (back pain).     chlorthalidone (HYGROTON) 25 MG tablet Take 1 tablet (25 mg total) by mouth daily. 30 tablet 0   Cholecalciferol (VITAMIN D3) 50 MCG (2000 UT) capsule Take 2,000 Units by mouth daily.     diclofenac (VOLTAREN) 75 MG EC tablet Take 75 mg by mouth 2 (two) times daily.     lidocaine (LIDODERM) 5 % Place 1 patch onto the skin daily as needed (back pain). Remove & Discard patch within 12 hours or as directed by MD     Misc Natural Products (OSTEO BI-FLEX ADV TRIPLE ST PO) Take 1 tablet by mouth daily.     OVER THE COUNTER MEDICATION Take 1 tablet by mouth daily. GNC MEGA MEN ENERGY AND METABOBLISM     polyvinyl alcohol (LIQUIFILM TEARS) 1.4 % ophthalmic solution Place 1 drop into both eyes as needed for dry eyes.     potassium chloride (KLOR-CON) 10 MEQ tablet Take 1 tablet (10 mEq total) by mouth daily. 30 tablet 1   traMADol (ULTRAM) 50 MG tablet 1 tablet p.o. twice daily as needed moderate to severe neck pain. 60 tablet 0   Turmeric Curcumin 500 MG CAPS Take 1 capsule by mouth daily.     vitamin E 180 MG (400 UNITS) capsule Take 400 Units by mouth daily.     lisinopril (ZESTRIL) 10 MG tablet Take 1 tablet (10 mg total) by mouth daily. 30 tablet 11   metoprolol succinate (TOPROL-XL) 50 MG 24 hr tablet Take 1 tablet (50 mg total) by mouth daily. TAKE WITH OR IMMEDIATELY FOLLOWING A  MEAL. 30 tablet 11   pantoprazole (PROTONIX) 40 MG tablet Take 1 tablet (40 mg total) by mouth 2 (two) times daily before a meal. 60 tablet 0   No current facility-administered medications on file prior to visit.    BP (!) 190/110   Pulse 69   Resp 18   Ht 5\' 9"  (1.753 m)   Wt 120 lb 6.4 oz (54.6 kg)   SpO2 93%   BMI 17.78 kg/m       Objective:   Physical Exam  General Mental  Status- Alert. General Appearance- Not in acute distress.   Skin General: Color- Normal Color. Moisture- Normal Moisture.  Neck Carotid Arteries- Normal color. Moisture- Normal Moisture. No carotid bruits. No JVD.  Chest and Lung Exam Auscultation: Breath Sounds:-Normal.  Cardiovascular Auscultation:Rythm- Regular. Murmurs & Other Heart Sounds:Auscultation of the heart reveals- No Murmurs.  Abdomen Inspection:-Inspeection Normal. Palpation/Percussion:Note:No mass. Palpation and Percussion of the abdomen reveal- Non Tender, Non Distended + BS, no rebound or guarding.   Neurologic Cranial Nerve exam:- CN III-XII intact(No nystagmus), symmetric smile. Strength:- 5/5 equal and symmetric strength both upper and lower extremities.       Assessment & Plan:   Patient Instructions  Chronic neck pain associated with cervical degenerative disc disease, mild stenosis and neuroforaminal narrowing.  Unfortunately pain has been severe over the last 2 days and seems not to be responding to tramadol.  Previously on a contract for tramadol.  Not giving tramadol refill since not working for pain presently.  We will give trial of 5 days of Norco.  Rx advisement given.  Particularly cautious on sedation side effect.  Plan to refer you to a neurosurgeon in January when you have medical insurance.  Also consider referral to pain management.   Blood pressure was very high initially and did come down on the second check.  However still quite high.  No neurologic signs or symptoms and normal neurologic exam.   Increasing her lisinopril to 20 mg daily and adding amlodipine 5 mg to BP med regimen.  Continue Toprol and chlorthalidone.  If with high blood pressures you have motor or neurologic deficits then be seen in the emergency department.  For history of GI bleed and gastritis refilled your Protonix.  Continue not to drink any alcohol.  Last CBC showed stable slightly improved hemoglobin hematocrit.  Follow-up with GI in December.  Follow-up 1 week or sooner if needed.   Mackie Pai, PA-C

## 2021-02-25 ENCOUNTER — Ambulatory Visit: Payer: Medicaid Other | Admitting: Medical

## 2021-03-02 ENCOUNTER — Ambulatory Visit: Payer: Medicaid Other | Admitting: Medical

## 2021-03-08 ENCOUNTER — Ambulatory Visit (INDEPENDENT_AMBULATORY_CARE_PROVIDER_SITE_OTHER): Payer: Self-pay | Admitting: Internal Medicine

## 2021-03-08 ENCOUNTER — Encounter: Payer: Self-pay | Admitting: Internal Medicine

## 2021-03-08 VITALS — BP 132/80 | HR 69 | Ht 69.0 in | Wt 121.0 lb

## 2021-03-08 DIAGNOSIS — Z1211 Encounter for screening for malignant neoplasm of colon: Secondary | ICD-10-CM

## 2021-03-08 DIAGNOSIS — K259 Gastric ulcer, unspecified as acute or chronic, without hemorrhage or perforation: Secondary | ICD-10-CM

## 2021-03-08 MED ORDER — SUTAB 1479-225-188 MG PO TABS: 24.0000 | ORAL_TABLET | Freq: Once | ORAL | 0 refills | Status: AC

## 2021-03-08 NOTE — Progress Notes (Signed)
Subjective:    Patient ID: Nathaniel Williams, male    DOB: 1960-05-06, 60 y.o.   MRN: [id number]  HPI Nathaniel Williams is a 60 year old male with a history of bleeding gastric ulcer, achalasia status post partial esophageal resection with gastric pull-through who is here for hospital follow-up.  He is here today with his male family member.  I saw him in consult in the hospital in August 2022 where he presented with melena and acute drop in hemoglobin.  He underwent upper endoscopy on 10/21/2020 which showed an esophagogastric anastomosis at 22 cm from the incisors.  There was a cratered gastric ulcer in the prepyloric stomach.  Biopsies showed reactive gastropathy without H. pylori.  He had been using ibuprofen at that time.  He has been on pantoprazole 40 mg twice daily.  He is feeling well.  He has had no further black stool or blood in stool.  No abdominal pain.  Eating well.  No dysphagia symptoms.  Bowel movements are regular.    He works as a Technical brewer.  He has avoided NSAIDs.  Last colonoscopy was in 2010.  No family history of colorectal cancer  Review of Systems As per HPI, otherwise negative  Current Medications, Allergies, Past Medical History, Past Surgical History, Family History and Social History were reviewed in [employer] record.    Objective:   Physical Exam BP 132/80    Pulse 69    Ht 5\' 9"  (1.753 m)    Wt 121 lb (54.9 kg)    SpO2 98%    BMI 17.87 kg/m  Gen: awake, alert, NAD HEENT: anicteric  CV: RRR, no mrg Pulm: CTA b/l Abd: soft, NT/ND, +BS throughout Ext: no c/c/e Neuro: nonfocal  CBC    Component Value Date/Time   WBC 4.7 01/05/2021 1043   RBC 4.25 01/05/2021 1043   HGB 11.6 (L) 01/05/2021 1043   HCT 35.7 (L) 01/05/2021 1043   PLT 199.0 01/05/2021 1043   MCV 84.0 01/05/2021 1043   MCH 31.1 10/24/2020 0754   MCHC 32.5 01/05/2021 1043   RDW 16.7 (H) 01/05/2021 1043   LYMPHSABS 1.4 01/05/2021 1043   MONOABS 0.5 01/05/2021 1043    EOSABS 0.1 01/05/2021 1043   BASOSABS 0.0 01/05/2021 1043   CMP     Component Value Date/Time   NA 138 01/05/2021 1043   K 3.0 (L) 01/05/2021 1043   CL 99 01/05/2021 1043   CO2 31 01/05/2021 1043   GLUCOSE 104 (H) 01/05/2021 1043   BUN 28 (H) 01/05/2021 1043   CREATININE 1.27 01/05/2021 1043   CALCIUM 9.4 01/05/2021 1043   PROT 7.0 01/05/2021 1043   ALBUMIN 4.3 01/05/2021 1043   AST 21 01/05/2021 1043   ALT 12 01/05/2021 1043   ALKPHOS 48 01/05/2021 1043   BILITOT 0.6 01/05/2021 1043   GFRNONAA >60 10/24/2020 0142        Assessment & Plan:  60 year old male with a history of bleeding gastric ulcer, achalasia status post partial esophageal resection with gastric pull-through who is here for hospital follow-up.  Hemorrhagic, NSAID induced, gastric ulcer --seen at hospitalization in August.  Biopsies negative for dysplasia or H. pylori.  He has been on twice daily PPI and avoiding NSAIDs.  Hemoglobin has mostly rebounded though he remains very slightly anemic. --Surveillance upper endoscopy recommended.  We reviewed the risk, benefits and alternatives and he is agreeable and wishes to proceed.  He will get medical insurance after the first of the year which should  help him with his medical bills --Reduce pantoprazole to 40 mg once daily; consider discontinuation altogether after EGD if he remains off NSAIDs and his ulcer has healed completely --Avoid NSAIDs  2.  Colon cancer screening --no family history of colorectal cancer.  Last colonoscopy was in 2010.  Screening colonoscopy recommended.  We reviewed the risk, benefits and alternatives and we will perform screening colonoscopy at the time of his upper endoscopy --Screening colonoscopy in the [facility]

## 2021-03-08 NOTE — Patient Instructions (Signed)
You have been scheduled for an endoscopy and colonoscopy. Please follow the written instructions given to you at your visit today. Please pick up your prep supplies at the pharmacy within the next 1-3 days. If you use inhalers (even only as needed), please bring them with you on the day of your procedure.  Decrease your pantoprazole to once daily dosing.  Please avoid all NSAID's. Some examples of NSAID's are as follows: Aspirin (Bufferin, Bayer, and Excedrin) Ibuprofen (Advil, Motrin, Nuprin) Ketoprofen (Actron, Orudis) Naproxen (Aleve) Daypro  Indocin  Lodine  Naprosyn  Relafen  Vimovo Voltaren  If you are age 56 or older, your body mass index should be between 23-30. Your Body mass index is 17.87 kg/m. If this is out of the aforementioned range listed, please consider follow up with your Primary Care Provider.  If you are age 83 or younger, your body mass index should be between 19-25. Your Body mass index is 17.87 kg/m. If this is out of the aformentioned range listed, please consider follow up with your Primary Care Provider.   ________________________________________________________  The [facility] providers would like to encourage you to use [facility] to communicate with providers for non-urgent requests or questions.  Due to long hold times on the telephone, sending your provider a message by [facility] may be a faster and more efficient way to get a response.  Please allow 48 business hours for a response.  Please remember that this is for non-urgent requests.  _______________________________________________________  Due to recent changes in healthcare laws, you may see the results of your imaging and laboratory studies on MyChart before your provider has had a chance to review them.  We understand that in some cases there may be results that are confusing or concerning to you. Not all laboratory results come back in the same time frame and the provider may be waiting for multiple  results in order to interpret others.  Please give [place] 48 hours in order for your provider to thoroughly review all the results before contacting the office for clarification of your results.

## 2021-03-09 ENCOUNTER — Ambulatory Visit (INDEPENDENT_AMBULATORY_CARE_PROVIDER_SITE_OTHER): Payer: Self-pay | Admitting: Medical

## 2021-03-09 VITALS — BP 128/80 | HR 96 | Temp 98.2°F | Resp 18 | Ht 69.0 in | Wt 120.0 lb

## 2021-03-09 DIAGNOSIS — K274 Chronic or unspecified peptic ulcer, site unspecified, with hemorrhage: Secondary | ICD-10-CM

## 2021-03-09 DIAGNOSIS — I10 Essential (primary) hypertension: Secondary | ICD-10-CM

## 2021-03-09 DIAGNOSIS — M542 Cervicalgia: Secondary | ICD-10-CM

## 2021-03-09 DIAGNOSIS — Z79899 Other long term (current) drug therapy: Secondary | ICD-10-CM

## 2021-03-09 MED ORDER — HYDROCODONE-ACETAMINOPHEN 5-325 MG PO TABS: ORAL_TABLET | ORAL | 0 refills | Status: DC

## 2021-03-09 MED ORDER — GABAPENTIN 100 MG PO CAPS: ORAL_CAPSULE | ORAL | 0 refills | Status: DC

## 2021-03-09 NOTE — Progress Notes (Signed)
Subjective:    Patient ID: Nathaniel Williams, male    DOB: 08-May-1960, 60 y.o.   MRN: [id number]  HPI Pt has hx of chronic neck pain. Pt states norco helped some but not completely. If gets cold and rainy the pain in the neck is worse, Family member notes his blood pressure is less with norco. Family speculates he is in less pain since bp better.  Pt saw GI MD just recently.  A/P 60 year old male with a history of bleeding gastric ulcer, achalasia status post partial esophageal resection with gastric pull-through who is here for hospital follow-up.    " Hemorrhagic, NSAID induced, gastric ulcer --seen at hospitalization in August.  Biopsies negative for dysplasia or H. pylori.  He has been on twice daily PPI and avoiding NSAIDs.  Hemoglobin has mostly rebounded though he remains very slightly anemic. --Surveillance upper endoscopy recommended.  We reviewed the risk, benefits and alternatives and he is agreeable and wishes to proceed.  He will get medical insurance after the first of the year which should help him with his medical bills --Reduce pantoprazole to 40 mg once daily; consider discontinuation altogether after EGD if he remains off NSAIDs and his ulcer has healed completely --Avoid NSAIDs   2.  Colon cancer screening --no family history of colorectal cancer.  Last colonoscopy was in 2010.  Screening colonoscopy recommended.  We reviewed the risk, benefits and alternatives and we will perform screening colonoscopy at the time of his upper endoscopy --Screening colonoscopy in the [place]"      Review of Systems  Constitutional:  Negative for chills, diaphoresis and fatigue.  Respiratory:  Negative for cough, chest tightness, shortness of breath and wheezing.   Cardiovascular:  Negative for chest pain and palpitations.  Gastrointestinal:  Negative for abdominal pain and nausea.  Genitourinary:  Negative for dysuria and flank pain.  Musculoskeletal:  Positive for neck pain. Negative  for back pain, gait problem, myalgias and neck stiffness.  Neurological:  Negative for dizziness and headaches.  Hematological:  Negative for adenopathy. Does not bruise/bleed easily.  Psychiatric/Behavioral:  Negative for behavioral problems and confusion. The patient is not nervous/anxious.    Past Medical History:  Diagnosis Date   Achalasia 1996   Dx in [place] s/p Heller Myotomy w/gastric pull-thru   Alcohol use    Aspiration pneumonia ([place])    Cervical spondylosis    Gastric ulcer    Gastritis    GI bleed    H/O hydrocele    of scrotum   Hypertension    IDA (iron deficiency anemia)      Social History   Socioeconomic History   Marital status: Legally Separated    Spouse name: Not on file   Number of children: Not on file   Years of education: Not on file   Highest education level: Not on file  Occupational History   Not on file  Tobacco Use   Smoking status: Former    Packs/day: 0.25    Years: 18.00    Pack years: 4.50    Types: Cigarettes   Smokeless tobacco: Never  Vaping Use   Vaping Use: Never used  Substance and Sexual Activity   Alcohol use: Not Currently    Comment: 1.5 L rum every 10 days (per hospital record 10/21/20)   Drug use: Never   Sexual activity: Not on file  Other Topics Concern   Not on file  Social History Narrative   Not on file   Social Determinants  of Health   Financial Resource Strain: Not on file  Food Insecurity: Not on file  Transportation Needs: Not on file  Physical Activity: Not on file  Stress: Not on file  Social Connections: Not on file  Intimate Partner Violence: Not on file    Past Surgical History:  Procedure Laterality Date   BIOPSY  10/21/2020   Procedure: BIOPSY;  Surgeon: Jerene Bears, MD;  Location: [facility] ENDOSCOPY;  Service: Gastroenterology;;   ESOPHAGOGASTRODUODENOSCOPY (EGD) WITH PROPOFOL N/A 10/21/2020   Procedure: ESOPHAGOGASTRODUODENOSCOPY (EGD) WITH PROPOFOL;  Surgeon: Jerene Bears, MD;  Location:  [place];  Service: Gastroenterology;  Laterality: N/A;   [place]   in [place]; w/gastric pull thru   [place]  10/21/2020   Procedure: HEMOSTASIS CLIP PLACEMENT;  Surgeon: Jerene Bears, MD;  Location: [facility] ENDOSCOPY;  Service: Gastroenterology;;    Family History  Problem Relation Age of Onset   Cervical cancer Mother    Colon cancer Mother    Alzheimer's disease Father    Stomach cancer Neg Hx    Esophageal cancer Neg Hx    Pancreatic cancer Neg Hx     No Known Allergies  Current Outpatient Medications on File Prior to Visit  Medication Sig Dispense Refill   amLODipine (NORVASC) 5 MG tablet Take 1 tablet (5 mg total) by mouth daily. 90 tablet 0   Ascorbic Acid (VITAMIN C) 1000 MG tablet Take 1,000 mg by mouth daily.     B Complex-C (SUPER B COMPLEX PO) Take 1 tablet by mouth daily.     Camphor-Menthol-Methyl Sal (TIGER BALM MUSCLE RUB EX) Apply 1 application topically as needed (back pain).     chlorthalidone (HYGROTON) 25 MG tablet Take 1 tablet (25 mg total) by mouth daily. 30 tablet 0   Cholecalciferol (VITAMIN D3) 50 MCG (2000 UT) capsule Take 2,000 Units by mouth daily.     diclofenac (VOLTAREN) 75 MG EC tablet Take 75 mg by mouth 2 (two) times daily.     lidocaine (LIDODERM) 5 % Place 1 patch onto the skin daily as needed (back pain). Remove & Discard patch within 12 hours or as directed by MD     lisinopril (ZESTRIL) 10 MG tablet 2 tab po daily 180 tablet 0   Misc Natural Products (OSTEO BI-FLEX ADV TRIPLE ST PO) Take 1 tablet by mouth daily.     OVER THE COUNTER MEDICATION Take 1 tablet by mouth daily. GNC MEGA MEN ENERGY AND METABOBLISM     pantoprazole (PROTONIX) 40 MG tablet Take 1 tablet (40 mg total) by mouth 2 (two) times daily before a meal. 60 tablet 3   polyvinyl alcohol (LIQUIFILM TEARS) 1.4 % ophthalmic solution Place 1 drop into both eyes as needed for dry eyes.     potassium chloride (KLOR-CON) 10 MEQ tablet Take 1 tablet (10  mEq total) by mouth daily. 30 tablet 1   Turmeric Curcumin 500 MG CAPS Take 1 capsule by mouth daily.     vitamin E 180 MG (400 UNITS) capsule Take 400 Units by mouth daily.     metoprolol succinate (TOPROL-XL) 50 MG 24 hr tablet Take 1 tablet (50 mg total) by mouth daily. TAKE WITH OR IMMEDIATELY FOLLOWING A MEAL. 30 tablet 11   No current facility-administered medications on file prior to visit.    BP 128/80    Pulse 96    Temp 98.2 F (36.8 C)    Resp 18    Ht 5\' 9"  (  1.753 m)    Wt 120 lb (54.4 kg)    SpO2 98%    BMI 17.72 kg/m         Objective:   Physical Exam  General- No acute distress. Pleasant patient. Neck- Full range of motion, no jvd Lungs- Clear, even and unlabored. Heart- regular rate and rhythm. Neurologic- CNII- XII grossly intact.       Assessment & Plan:   Patient Instructions  Bp well controlled. Continue chlorthadlidone and amlodipine.  For neck pain will prescribe norco 5/325 mg 1-2 tab po prn severe pain. But try to just limit to 1 tab presently in am when you awaken. Also can use gabapentin 100 mg at night. Rx advisment.  Will get you to sign controlled med contract and give uds today.  Glad to see you have followed up with GI MD.  Follow up in one month or sooner if needed.   Mackie Pai, PA-C

## 2021-03-09 NOTE — Patient Instructions (Addendum)
Bp well controlled. Continue chlorthadlidone and amlodipine.  For neck pain will prescribe norco 5/325 mg 1-2 tab po prn severe pain. But try to just limit to 1 tab presently in am when you awaken. Also can use gabapentin 100 mg at night. Rx advisement.  Will get you to sign controlled med contract and give uds today.  Glad to see you have followed up with GI MD.  Follow up in one month or sooner if needed.

## 2021-03-11 LAB — DRUG MONITORING PANEL 375977 , URINE
Alcohol Metabolites: POSITIVE ng/mL — AB (ref <500)
Amphetamines: NEGATIVE ng/mL (ref <500)
Barbiturates: NEGATIVE ng/mL (ref <300)
Benzodiazepines: NEGATIVE ng/mL (ref <100)
Cocaine Metabolite: NEGATIVE ng/mL (ref <150)
Desmethyltramadol: 904 ng/mL — ABNORMAL HIGH (ref <100)
Ethyl Glucuronide (ETG): >10000 ng/mL — ABNORMAL HIGH (ref <500)
Ethyl Sulfate (ETS): >10000 ng/mL — ABNORMAL HIGH (ref <100)
Marijuana Metabolite: NEGATIVE ng/mL (ref <20)
Opiates: NEGATIVE ng/mL (ref <100)
Oxycodone: NEGATIVE ng/mL (ref <100)
Tramadol: 2124 ng/mL — ABNORMAL HIGH (ref <100)

## 2021-03-11 LAB — DM TEMPLATE

## 2021-04-08 DIAGNOSIS — I10 Essential (primary) hypertension: Secondary | ICD-10-CM | POA: Diagnosis not present

## 2021-04-08 DIAGNOSIS — M47812 Spondylosis without myelopathy or radiculopathy, cervical region: Secondary | ICD-10-CM | POA: Diagnosis not present

## 2021-04-08 DIAGNOSIS — M5416 Radiculopathy, lumbar region: Secondary | ICD-10-CM | POA: Diagnosis not present

## 2021-04-11 ENCOUNTER — Ambulatory Visit (INDEPENDENT_AMBULATORY_CARE_PROVIDER_SITE_OTHER): Payer: BC Managed Care – PPO | Admitting: Medical

## 2021-04-11 VITALS — BP 120/80 | HR 73 | Resp 18 | Ht 69.0 in | Wt 123.8 lb

## 2021-04-11 DIAGNOSIS — D649 Anemia, unspecified: Secondary | ICD-10-CM | POA: Diagnosis not present

## 2021-04-11 DIAGNOSIS — K219 Gastro-esophageal reflux disease without esophagitis: Secondary | ICD-10-CM

## 2021-04-11 DIAGNOSIS — M542 Cervicalgia: Secondary | ICD-10-CM | POA: Diagnosis not present

## 2021-04-11 DIAGNOSIS — I10 Essential (primary) hypertension: Secondary | ICD-10-CM | POA: Diagnosis not present

## 2021-04-11 DIAGNOSIS — L309 Dermatitis, unspecified: Secondary | ICD-10-CM

## 2021-04-11 MED ORDER — HYDROCODONE-ACETAMINOPHEN 5-325 MG PO TABS: ORAL_TABLET | ORAL | 0 refills | Status: DC

## 2021-04-11 NOTE — Patient Instructions (Addendum)
Hypertension-blood pressure well controlled today.  Continue amlodipine, lisinopril and metoprolol.  Neck pain and low back pain gets chronic.  Glad to hear that you to get in with the neurosurgeon office and you have upcoming follow-up appointment.  During the interim we will continue to prescribe Norco to use at most 1 tablet every 12 hours..  Continue gabapentin.  GERD-symptoms controlled with pantoprazole.  Recommend following through with EGD scheduled recently by GI MD.  Anemia recently improved per GI MD note reviewed.  We will continue to follow intermittently as well.  Dermatitis right lower extremity.  Placed referral to dermatologist.  Follow-up in 3 months or sooner if needed.

## 2021-04-11 NOTE — Progress Notes (Signed)
Subjective:    Patient ID: Nathaniel Williams, male    DOB: 02-22-1961, 61 y.o.   MRN: [id number]  HPI  Pt give me update that he did see neurosurgeon for neck pain and lower back pain.   Pt was norco for severe neck pain. Also was using gabapentin.  Pt is waiting for follow up appointment with neurosurgeon.  His stomach has been controlled. He saw Dr. Hilarie Fredrickson  61 year old male with a history of bleeding gastric ulcer, achalasia status post partial esophageal resection with gastric pull-through who is here for hospital follow-up.   Hemorrhagic, NSAID induced, gastric ulcer --seen at hospitalization in August.  Biopsies negative for dysplasia or H. pylori.  He has been on twice daily PPI and avoiding NSAIDs.  Hemoglobin has mostly rebounded though he remains very slightly anemic. --Surveillance upper endoscopy recommended.  We reviewed the risk, benefits and alternatives and he is agreeable and wishes to proceed.  He will get medical insurance after the first of the year which should help him with his medical bills --Reduce pantoprazole to 40 mg once daily; consider discontinuation altogether after EGD if he remains off NSAIDs and his ulcer has healed completely --Avoid NSAIDs   2.  Colon cancer screening --no family history of colorectal cancer.  Last colonoscopy was in 2010.  Screening colonoscopy recommended.  We reviewed the risk, benefits and alternatives and we will perform screening colonoscopy at the time of his upper endoscopy --Screening colonoscopy in the [facility]   Apr 21, 2021 getting endoscopy.  Pt is still doing light duty. Worker comp doctor has him limited work not to exceed lifting 30 lbs.   He takes norco early monring and then late evening if needed. Not taking before work.    Review of Systems  Constitutional:  Negative for chills, diaphoresis and fatigue.  Respiratory:  Negative for cough, chest tightness, shortness of breath and wheezing.   Cardiovascular:  Negative  for chest pain and palpitations.  Gastrointestinal:  Negative for abdominal pain and nausea.  Genitourinary:  Negative for dysuria and flank pain.  Musculoskeletal:  Positive for back pain and neck pain. Negative for gait problem, myalgias and neck stiffness.  Neurological:  Negative for dizziness and headaches.  Hematological:  Negative for adenopathy. Does not bruise/bleed easily.  Psychiatric/Behavioral:  Negative for behavioral problems and confusion. The patient is not nervous/anxious.     Past Medical History:  Diagnosis Date   Achalasia 1996   Dx in [place] s/p Heller Myotomy w/gastric pull-thru   Alcohol use    Aspiration pneumonia ([place])    Cervical spondylosis    Gastric ulcer    Gastritis    GI bleed    H/O hydrocele    of scrotum   Hypertension    IDA (iron deficiency anemia)      Social History   Socioeconomic History   Marital status: Legally Separated    Spouse name: Not on file   Number of children: Not on file   Years of education: Not on file   Highest education level: Not on file  Occupational History   Not on file  Tobacco Use   Smoking status: Former    Packs/day: 0.25    Years: 18.00    Pack years: 4.50    Types: Cigarettes   Smokeless tobacco: Never  Vaping Use   Vaping Use: Never used  Substance and Sexual Activity   Alcohol use: Not Currently    Comment: 1.5 L rum every 10 days (per  hospital record 10/21/20)   Drug use: Never   Sexual activity: Not on file  Other Topics Concern   Not on file  Social History Narrative   Not on file   Social Determinants of Health   Financial Resource Strain: Not on file  Food Insecurity: Not on file  Transportation Needs: Not on file  Physical Activity: Not on file  Stress: Not on file  Social Connections: Not on file  Intimate Partner Violence: Not on file    Past Surgical History:  Procedure Laterality Date   BIOPSY  10/21/2020   Procedure: BIOPSY;  Surgeon: Jerene Bears, MD;  Location: [place];  Service: Gastroenterology;;   ESOPHAGOGASTRODUODENOSCOPY (EGD) WITH PROPOFOL N/A 10/21/2020   Procedure: ESOPHAGOGASTRODUODENOSCOPY (EGD) WITH PROPOFOL;  Surgeon: Jerene Bears, MD;  Location: [place];  Service: Gastroenterology;  Laterality: N/A;   [place]   in [place]; w/gastric pull thru   [place]  10/21/2020   Procedure: HEMOSTASIS CLIP PLACEMENT;  Surgeon: Jerene Bears, MD;  Location: [facility];  Service: Gastroenterology;;    Family History  Problem Relation Age of Onset   Cervical cancer Mother    Colon cancer Mother    Alzheimer's disease Father    Stomach cancer Neg Hx    Esophageal cancer Neg Hx    Pancreatic cancer Neg Hx     No Known Allergies  Current Outpatient Medications on File Prior to Visit  Medication Sig Dispense Refill   amLODipine (NORVASC) 5 MG tablet Take 1 tablet (5 mg total) by mouth daily. 90 tablet 0   Ascorbic Acid (VITAMIN C) 1000 MG tablet Take 1,000 mg by mouth daily.     B Complex-C (SUPER B COMPLEX PO) Take 1 tablet by mouth daily.     Camphor-Menthol-Methyl Sal (TIGER BALM MUSCLE RUB EX) Apply 1 application topically as needed (back pain).     chlorthalidone (HYGROTON) 25 MG tablet Take 1 tablet (25 mg total) by mouth daily. 30 tablet 0   Cholecalciferol (VITAMIN D3) 50 MCG (2000 UT) capsule Take 2,000 Units by mouth daily.     diclofenac (VOLTAREN) 75 MG EC tablet Take 75 mg by mouth 2 (two) times daily.     gabapentin (NEURONTIN) 100 MG capsule 1 tab po prior to sleep. 30 capsule 0   HYDROcodone-acetaminophen (NORCO) 5-325 MG tablet 1-2 tab po daily for severe pain 30 tablet 0   lidocaine (LIDODERM) 5 % Place 1 patch onto the skin daily as needed (back pain). Remove & Discard patch within 12 hours or as directed by MD     lisinopril (ZESTRIL) 10 MG tablet 2 tab po daily 180 tablet 0   Misc Natural Products (OSTEO BI-FLEX ADV TRIPLE ST PO) Take 1 tablet by mouth daily.     OVER THE COUNTER  MEDICATION Take 1 tablet by mouth daily. [employer] MEGA MEN ENERGY AND METABOBLISM     pantoprazole (PROTONIX) 40 MG tablet Take 1 tablet (40 mg total) by mouth 2 (two) times daily before a meal. 60 tablet 3   polyvinyl alcohol (LIQUIFILM TEARS) 1.4 % ophthalmic solution Place 1 drop into both eyes as needed for dry eyes.     potassium chloride (KLOR-CON) 10 MEQ tablet Take 1 tablet (10 mEq total) by mouth daily. 30 tablet 1   Turmeric Curcumin 500 MG CAPS Take 1 capsule by mouth daily.     vitamin E 180 MG (400 UNITS) capsule Take 400 Units by mouth daily.  metoprolol succinate (TOPROL-XL) 50 MG 24 hr tablet Take 1 tablet (50 mg total) by mouth daily. TAKE WITH OR IMMEDIATELY FOLLOWING A MEAL. 30 tablet 11   No current facility-administered medications on file prior to visit.    BP 120/80    Pulse 73    Resp 18    Ht 5\' 9"  (1.753 m)    Wt 123 lb 12.8 oz (56.2 kg)    SpO2 97%    BMI 18.28 kg/m       Objective:   Physical Exam   General- No acute distress. Pleasant patient. Neck- Full range of motion, no jvd Lungs- Clear, even and unlabored. Heart- regular rate and rhythm. Neurologic- CNII- XII grossly intact.  Abdomen-soft, nt, nd, +bs, no rebound or guarding and no organomegaly. Skin- rt lower ext rash- appear hyperigmented skin lateral ankle and foot.  No redness, no warmth or tenderness.    Assessment & Plan:   Patient Instructions  Hypertension-blood pressure well controlled today.  Continue amlodipine, lisinopril and metoprolol.  Neck pain and low back pain gets chronic.  Glad to hear that you to get in with the neurosurgeon office and you have upcoming follow-up appointment.  During the interim we will continue to prescribe Norco to use at most 1 tablet every 12 hours..  Continue gabapentin.  GERD-symptoms controlled with pantoprazole.  Recommend following through with EGD scheduled recently by GI MD.  Anemia recently improved per GI MD note reviewed.  We will continue to  follow intermittently as well.  Dermatitis right lower extremity.  Placed referral to dermatologist.  Follow-up in 3 months or sooner if needed.

## 2021-04-21 ENCOUNTER — Ambulatory Visit (AMBULATORY_SURGERY_CENTER): Payer: BC Managed Care – PPO | Admitting: Internal Medicine

## 2021-04-21 ENCOUNTER — Encounter: Payer: Self-pay | Admitting: Internal Medicine

## 2021-04-21 VITALS — BP 123/90 | HR 76 | Temp 96.9°F | Resp 15 | Ht 69.0 in | Wt 121.0 lb

## 2021-04-21 DIAGNOSIS — K259 Gastric ulcer, unspecified as acute or chronic, without hemorrhage or perforation: Secondary | ICD-10-CM

## 2021-04-21 DIAGNOSIS — K229 Disease of esophagus, unspecified: Secondary | ICD-10-CM

## 2021-04-21 DIAGNOSIS — Z1211 Encounter for screening for malignant neoplasm of colon: Secondary | ICD-10-CM | POA: Diagnosis not present

## 2021-04-21 DIAGNOSIS — D122 Benign neoplasm of ascending colon: Secondary | ICD-10-CM

## 2021-04-21 DIAGNOSIS — K2950 Unspecified chronic gastritis without bleeding: Secondary | ICD-10-CM | POA: Diagnosis not present

## 2021-04-21 DIAGNOSIS — Z8719 Personal history of other diseases of the digestive system: Secondary | ICD-10-CM | POA: Diagnosis not present

## 2021-04-21 MED ORDER — SODIUM CHLORIDE 0.9 % IV SOLN: 500.0000 mL | Freq: Once | INTRAVENOUS | Status: DC

## 2021-04-21 MED ORDER — PANTOPRAZOLE SODIUM 40 MG PO TBEC: 40.0000 mg | DELAYED_RELEASE_TABLET | Freq: Every day | ORAL | 3 refills | Status: DC

## 2021-04-21 NOTE — Op Note (Signed)
[name] Procedure Date: 04/21/2021 2:26 PM MRN: [id number] Endoscopist: Jerene Bears , MD Age: 61 Referring MD:  Date of Birth: 31-Jul-1960 Gender: Male Account #: [id number] Procedure:                Upper GI endoscopy Indications:              Follow-up of acute gastric ulcer, hx of prior                            esophagectomy for achalasia Medicines:                Monitored Anesthesia Care Procedure:                Pre-Anesthesia Assessment:                           - Prior to the procedure, a History and Physical                            was performed, and patient medications and                            allergies were reviewed. The patient's tolerance of                            previous anesthesia was also reviewed. The risks                            and benefits of the procedure and the sedation                            options and risks were discussed with the patient.                            All questions were answered, and informed consent                            was obtained. Prior Anticoagulants: The patient has                            taken no previous anticoagulant or antiplatelet                            agents. ASA Grade Assessment: II - A patient with                            mild systemic disease. After reviewing the risks                            and benefits, the patient was deemed in                            satisfactory condition to undergo the procedure.  After obtaining informed consent, the endoscope was                            passed under direct vision. Throughout the                            procedure, the patient's blood pressure, pulse, and                            oxygen saturations were monitored continuously. The                            Endoscope was introduced through the mouth, and                            advanced to the second part of  duodenum. The upper                            GI endoscopy was accomplished without difficulty.                            The patient tolerated the procedure well. Scope In: Scope Out: Findings:                 An esophagogastric anastomosis was found in the                            upper third of the esophagus 22 cm from the                            incisors.                           The Z-line was irregular and was found 22 cm from                            the incisors. Biopsies were taken with a cold                            forceps for histology to exclude Barrett's.                           The entire examined stomach was normal. Previously                            seen gastric ulcer has healed.                           The examined duodenum was normal. Complications:            No immediate complications. Estimated Blood Loss:     Estimated blood loss was minimal. Impression:               - An esophagogastric anastomosis was found in upper  esophagus.                           - Z-line irregular, 22 cm from the incisors.                            Biopsied.                           - Normal stomach. Gastric ulcer has healed with                            medical therapy.                           - Normal examined duodenum. Recommendation:           - Patient has a contact number available for                            emergencies. The signs and symptoms of potential                            delayed complications were discussed with the                            patient. Return to normal activities tomorrow.                            Written discharge instructions were provided to the                            patient.                           - Resume previous diet.                           - Continue present medications.                           - Reduce pantoprazole to 40 mg once daily.                           - Await  pathology results. Jerene Bears, MD 04/21/2021 3:03:50 PM This report has been signed electronically.

## 2021-04-21 NOTE — Progress Notes (Signed)
Called to room to assist during endoscopic procedure.  Patient ID and intended procedure confirmed with present staff. Received instructions for my participation in the procedure from the performing physician.  

## 2021-04-21 NOTE — Progress Notes (Signed)
GASTROENTEROLOGY PROCEDURE H&P NOTE   Primary Care Physician: Mackie Pai, PA-C    Reason for Procedure:  Follow-up gastric ulcer and colon cancer screening  Plan:    EGD and colonoscopy  Patient is appropriate for endoscopic procedure(s) in the ambulatory ([place]) setting.  The nature of the procedure, as well as the risks, benefits, and alternatives were carefully and thoroughly reviewed with the patient. Ample time for discussion and questions allowed. The patient understood, was satisfied, and agreed to proceed.     HPI: Nathaniel Williams is a 61 y.o. male who presents for EGD and colonoscopy.  Medical history as below.  Seen in the office on 03/08/2021.  See note for details.  No significant change in medical history since that time.  Tolerated the prep.  No recent chest pain or shortness of breath.  Past Medical History:  Diagnosis Date   Achalasia 1996   Dx in [place] s/p Heller Myotomy w/gastric pull-thru   Alcohol use    Aspiration pneumonia ([place])    Cervical spondylosis    Gastric ulcer    Gastritis    GI bleed    H/O hydrocele    of scrotum   Hypertension    IDA (iron deficiency anemia)     Past Surgical History:  Procedure Laterality Date   BIOPSY  10/21/2020   Procedure: BIOPSY;  Surgeon: Jerene Bears, MD;  Location: [facility];  Service: Gastroenterology;;   ESOPHAGOGASTRODUODENOSCOPY (EGD) WITH PROPOFOL N/A 10/21/2020   Procedure: ESOPHAGOGASTRODUODENOSCOPY (EGD) WITH PROPOFOL;  Surgeon: Jerene Bears, MD;  Location: [place];  Service: Gastroenterology;  Laterality: N/A;   [place]   in [place]; w/gastric pull thru   [place]  10/21/2020   Procedure: HEMOSTASIS CLIP PLACEMENT;  Surgeon: Jerene Bears, MD;  Location: [facility];  Service: Gastroenterology;;    Prior to Admission medications   Medication Sig Start Date End Date Taking? Authorizing Provider  amLODipine (NORVASC) 5 MG tablet Take 1 tablet (5 mg  total) by mouth daily. 02/16/21  Yes Saguier, Percell Miller, PA-C  Ascorbic Acid (VITAMIN C) 1000 MG tablet Take 1,000 mg by mouth daily.   Yes [provider]  B Complex-C (SUPER B COMPLEX PO) Take 1 tablet by mouth daily.   Yes [provider]  chlorthalidone (HYGROTON) 25 MG tablet Take 1 tablet (25 mg total) by mouth daily. 12/10/20  Yes Saguier, Percell Miller, PA-C  Cholecalciferol (VITAMIN D3) 50 MCG (2000 UT) capsule Take 2,000 Units by mouth daily.   Yes [provider]  diclofenac (VOLTAREN) 75 MG EC tablet Take 75 mg by mouth 2 (two) times daily. 09/14/20  Yes [provider]  gabapentin (NEURONTIN) 100 MG capsule 1 tab po prior to sleep. 03/09/21  Yes Saguier, Percell Miller, PA-C  HYDROcodone-acetaminophen [facility]) 5-325 MG tablet 1-2 tab po daily for severe pain 03/09/21  Yes Saguier, Percell Miller, PA-C  HYDROcodone-acetaminophen [facility]) 5-325 MG tablet 1 tab every 12 hours as needed severe pain 04/11/21  Yes Saguier, Percell Miller, PA-C  lisinopril (ZESTRIL) 10 MG tablet 2 tab po daily 02/16/21  Yes Saguier, Percell Miller, PA-C  Misc Natural Products (OSTEO BI-FLEX ADV TRIPLE ST PO) Take 1 tablet by mouth daily.   Yes [provider]  OVER THE COUNTER MEDICATION Take 1 tablet by mouth daily. [place] MEGA MEN ENERGY AND METABOBLISM   Yes [provider]  polyvinyl alcohol (LIQUIFILM TEARS) 1.4 % ophthalmic solution Place 1 drop into both eyes as needed for dry eyes.  Yes [provider]  potassium chloride (KLOR-CON) 10 MEQ tablet Take 1 tablet (10 mEq total) by mouth daily. 01/06/21  Yes Saguier, Percell Miller, PA-C  Turmeric Curcumin 500 MG CAPS Take 1 capsule by mouth daily.   Yes [provider]  vitamin E 180 MG (400 UNITS) capsule Take 400 Units by mouth daily.   Yes [provider]  Camphor-Menthol-Methyl Sal (TIGER BALM MUSCLE RUB EX) Apply 1 application topically as needed (back pain). Patient not taking: Reported on 04/21/2021    [provider]   lidocaine (LIDODERM) 5 % Place 1 patch onto the skin daily as needed (back pain). Remove & Discard patch within 12 hours or as directed by MD Patient not taking: Reported on 04/21/2021    [provider]  metoprolol succinate (TOPROL-XL) 50 MG 24 hr tablet Take 1 tablet (50 mg total) by mouth daily. TAKE WITH OR IMMEDIATELY FOLLOWING A MEAL. 12/10/20 01/09/21  Saguier, Percell Miller, PA-C  pantoprazole (PROTONIX) 40 MG tablet Take 1 tablet (40 mg total) by mouth 2 (two) times daily before a meal. 02/16/21 04/17/21  Saguier, Percell Miller, PA-C    Current Outpatient Medications  Medication Sig Dispense Refill   amLODipine (NORVASC) 5 MG tablet Take 1 tablet (5 mg total) by mouth daily. 90 tablet 0   Ascorbic Acid (VITAMIN C) 1000 MG tablet Take 1,000 mg by mouth daily.     B Complex-C (SUPER B COMPLEX PO) Take 1 tablet by mouth daily.     chlorthalidone (HYGROTON) 25 MG tablet Take 1 tablet (25 mg total) by mouth daily. 30 tablet 0   Cholecalciferol (VITAMIN D3) 50 MCG (2000 UT) capsule Take 2,000 Units by mouth daily.     diclofenac (VOLTAREN) 75 MG EC tablet Take 75 mg by mouth 2 (two) times daily.     gabapentin (NEURONTIN) 100 MG capsule 1 tab po prior to sleep. 30 capsule 0   HYDROcodone-acetaminophen (NORCO) 5-325 MG tablet 1-2 tab po daily for severe pain 30 tablet 0   HYDROcodone-acetaminophen (NORCO) 5-325 MG tablet 1 tab every 12 hours as needed severe pain 30 tablet 0   lisinopril (ZESTRIL) 10 MG tablet 2 tab po daily 180 tablet 0   Misc Natural Products (OSTEO BI-FLEX ADV TRIPLE ST PO) Take 1 tablet by mouth daily.     OVER THE COUNTER MEDICATION Take 1 tablet by mouth daily. [employer] MEGA MEN ENERGY AND METABOBLISM     polyvinyl alcohol (LIQUIFILM TEARS) 1.4 % ophthalmic solution Place 1 drop into both eyes as needed for dry eyes.     potassium chloride (KLOR-CON) 10 MEQ tablet Take 1 tablet (10 mEq total) by mouth daily. 30 tablet 1   Turmeric Curcumin 500 MG CAPS Take 1 capsule by mouth  daily.     vitamin E 180 MG (400 UNITS) capsule Take 400 Units by mouth daily.     Camphor-Menthol-Methyl Sal (TIGER BALM MUSCLE RUB EX) Apply 1 application topically as needed (back pain). (Patient not taking: Reported on 04/21/2021)     lidocaine (LIDODERM) 5 % Place 1 patch onto the skin daily as needed (back pain). Remove & Discard patch within 12 hours or as directed by MD (Patient not taking: Reported on 04/21/2021)     metoprolol succinate (TOPROL-XL) 50 MG 24 hr tablet Take 1 tablet (50 mg total) by mouth daily. TAKE WITH OR IMMEDIATELY FOLLOWING A MEAL. 30 tablet 11   pantoprazole (PROTONIX) 40 MG tablet Take 1 tablet (40 mg total) by mouth 2 (two) times  daily before a meal. 60 tablet 3   Current Facility-Administered Medications  Medication Dose Route Frequency Provider Last Rate Last Admin   0.9 %  sodium chloride infusion  500 mL Intravenous Once General Wearing, Lajuan Lines, MD        Allergies as of 04/21/2021   (No Known Allergies)    Family History  Problem Relation Age of Onset   Cervical cancer Mother    Colon cancer Mother    Alzheimer's disease Father    Stomach cancer Neg Hx    Esophageal cancer Neg Hx    Pancreatic cancer Neg Hx     Social History   Socioeconomic History   Marital status: Legally Separated    Spouse name: Not on file   Number of children: Not on file   Years of education: Not on file   Highest education level: Not on file  Occupational History   Not on file  Tobacco Use   Smoking status: Former    Packs/day: 0.25    Years: 18.00    Pack years: 4.50    Types: Cigarettes   Smokeless tobacco: Never  Vaping Use   Vaping Use: Never used  Substance and Sexual Activity   Alcohol use: Not Currently    Comment: 1.5 L rum every 10 days (per hospital record 10/21/20)   Drug use: Never   Sexual activity: Not on file  Other Topics Concern   Not on file  Social History Narrative   Not on file   Social Determinants of Health   Financial Resource Strain: Not  on file  Food Insecurity: Not on file  Transportation Needs: Not on file  Physical Activity: Not on file  Stress: Not on file  Social Connections: Not on file  Intimate Partner Violence: Not on file    Physical Exam: Vital signs in last 24 hours: @BP  133/79    Pulse 100    Temp (!) 96.9 F (36.1 C)    Ht 5\' 9"  (1.753 m)    Wt 121 lb (54.9 kg)    SpO2 99%    BMI 17.87 kg/m  GEN: NAD EYE: Sclerae anicteric ENT: MMM CV: Non-tachycardic Pulm: CTA b/l GI: Soft, NT/ND NEURO:  Alert & Oriented x 3   Zenovia Jarred, MD [facility]  04/21/2021 2:27 PM

## 2021-04-21 NOTE — Patient Instructions (Addendum)
Take your stomach medicine once daily on an empty stomach.  Read the handouts given to you by your recovery room nurse.  Try to adapt your diet to a low acid.  Read the orange handout.  YOU HAD AN ENDOSCOPIC PROCEDURE TODAY AT [facility]:   Refer to the procedure report that was given to you for any specific questions about what was found during the examination.  If the procedure report does not answer your questions, please call your gastroenterologist to clarify.  If you requested that your care partner not be given the details of your procedure findings, then the procedure report has been included in a sealed envelope for you to review at your convenience later.  YOU SHOULD EXPECT: Some feelings of bloating in the abdomen. Passage of more gas than usual.  Walking can help get rid of the air that was put into your GI tract during the procedure and reduce the bloating. If you had a lower endoscopy (such as a colonoscopy or flexible sigmoidoscopy) you may notice spotting of blood in your stool or on the toilet paper. If you underwent a bowel prep for your procedure, you may not have a normal bowel movement for a few days.  Please Note:  You might notice some irritation and congestion in your nose or some drainage.  This is from the oxygen used during your procedure.  There is no need for concern and it should clear up in a day or so.  SYMPTOMS TO REPORT IMMEDIATELY:  Following lower endoscopy (colonoscopy or flexible sigmoidoscopy):  Excessive amounts of blood in the stool  Significant tenderness or worsening of abdominal pains  Swelling of the abdomen that is new, acute  Fever of 100F or higher  Following upper endoscopy (EGD)  Vomiting of blood or coffee ground material  New chest pain or pain under the shoulder blades  Painful or persistently difficult swallowing  New shortness of breath  Fever of 100F or higher  Black, tarry-looking stools  For urgent or emergent  issues, a gastroenterologist can be reached at any hour by calling [phone]. Do not use MyChart messaging for urgent concerns.    DIET:  We do recommend a small meal at first, but then you may proceed to your regular diet.  Drink plenty of fluids but you should avoid alcoholic beverages for 24 hours.  ACTIVITY:  You should plan to take it easy for the rest of today and you should NOT DRIVE or use heavy machinery until tomorrow (because of the sedation medicines used during the test).    FOLLOW UP: Our staff will call the number listed on your records 48-72 hours following your procedure to check on you and address any questions or concerns that you may have regarding the information given to you following your procedure. If we do not reach you, we will leave a message.  We will attempt to reach you two times.  During this call, we will ask if you have developed any symptoms of COVID 19. If you develop any symptoms (ie: fever, flu-like symptoms, shortness of breath, cough etc.) before then, please call [phone].  If you test positive for Covid 19 in the 2 weeks post procedure, please call and report this information to [place].    If any biopsies were taken you will be contacted by phone or by letter within the next 1-3 weeks.  Please call us at [phone] if you have not heard about the biopsies in  3 weeks.    SIGNATURES/CONFIDENTIALITY: You and/or your care partner have signed paperwork which will be entered into your electronic medical record.  These signatures attest to the fact that that the information above on your After Visit Summary has been reviewed and is understood.  Full responsibility of the confidentiality of this discharge information lies with you and/or your care-partner.

## 2021-04-21 NOTE — Op Note (Signed)
[facility] Patient Name: Nathaniel Williams Procedure Date: 04/21/2021 2:26 PM MRN: [id number] Endoscopist: Jerene Bears , MD Age: 61 Referring MD:  Date of Birth: 06-22-1960 Gender: Male Account #: [id number] Procedure:                Colonoscopy Indications:              Screening for colorectal malignant neoplasm, Last                            colonoscopy: 2010 Medicines:                Monitored Anesthesia Care Procedure:                Pre-Anesthesia Assessment:                           - Prior to the procedure, a History and Physical                            was performed, and patient medications and                            allergies were reviewed. The patient's tolerance of                            previous anesthesia was also reviewed. The risks                            and benefits of the procedure and the sedation                            options and risks were discussed with the patient.                            All questions were answered, and informed consent                            was obtained. Prior Anticoagulants: The patient has                            taken no previous anticoagulant or antiplatelet                            agents. ASA Grade Assessment: II - A patient with                            mild systemic disease. After reviewing the risks                            and benefits, the patient was deemed in                            satisfactory condition to undergo the procedure.  After obtaining informed consent, the colonoscope                            was passed under direct vision. Throughout the                            procedure, the patient's blood pressure, pulse, and                            oxygen saturations were monitored continuously. The                            PCF-HQ190L Colonoscope was introduced through the                            anus and advanced to the cecum, identified  by                            appendiceal orifice and ileocecal valve. The                            colonoscopy was performed without difficulty. The                            patient tolerated the procedure well. The quality                            of the bowel preparation was good. The ileocecal                            valve, appendiceal orifice, and rectum were                            photographed. Scope In: 2:42:40 PM Scope Out: 2:57:13 PM Scope Withdrawal Time: 0 hours 9 minutes 52 seconds  Total Procedure Duration: 0 hours 14 minutes 33 seconds  Findings:                 The digital rectal exam was normal.                           Two sessile polyps were found in the ascending                            colon. The polyps were 2 and 7 mm in size. These                            polyps were removed with a cold snare. Resection                            and retrieval were complete.                           Internal hemorrhoids were found during  retroflexion. The hemorrhoids were small.                           The exam was otherwise without abnormality. Complications:            No immediate complications. Estimated Blood Loss:     Estimated blood loss was minimal. Impression:               - Two 2 and 7 mm polyps in the ascending colon,                            removed with a cold snare. Resected and retrieved.                           - Internal hemorrhoids.                           - The examination was otherwise normal. Recommendation:           - Patient has a contact number available for                            emergencies. The signs and symptoms of potential                            delayed complications were discussed with the                            patient. Return to normal activities tomorrow.                            Written discharge instructions were provided to the                            patient.                            - Resume previous diet.                           - Continue present medications.                           - Await pathology results.                           - Repeat colonoscopy is recommended. The                            colonoscopy date will be determined after pathology                            results from today's exam become available for                            review. Jerene Bears, MD 04/21/2021 3:05:33 PM This report has been signed electronically.

## 2021-04-21 NOTE — Progress Notes (Signed)
Report to PACU, RN, vss, BBS= Clear.  

## 2021-04-25 ENCOUNTER — Telehealth: Payer: Self-pay | Admitting: *Deleted

## 2021-04-25 ENCOUNTER — Telehealth: Payer: Self-pay

## 2021-04-25 NOTE — Telephone Encounter (Signed)
°  Follow up Call-  Call back number 04/21/2021  Post procedure Call Back phone  # [phone]  Permission to leave phone message Yes     Patient questions:  Do you have a fever, pain , or abdominal swelling? No. Pain Score  0 *  Have you tolerated food without any problems? Yes.    Have you been able to return to your normal activities? Yes.    Do you have any questions about your discharge instructions: Diet   No. Medications  No. Follow up visit  No.  Do you have questions or concerns about your Care? No.  Actions: * If pain score is 4 or above: No action needed, pain <4.  Have you developed a fever since your procedure? no  2.   Have you had an respiratory symptoms (SOB or cough) since your procedure? no  3.   Have you tested positive for COVID 19 since your procedure no  4.   Have you had any family members/close contacts diagnosed with the COVID 19 since your procedure?  no   If yes to any of these questions please route to Joylene John, RN and Joella Prince, RN

## 2021-04-25 NOTE — Telephone Encounter (Signed)
Follow up call placed, VM obtained and message left. ?SChaplin, RN,BSN ? ?

## 2021-04-26 DIAGNOSIS — M47812 Spondylosis without myelopathy or radiculopathy, cervical region: Secondary | ICD-10-CM | POA: Diagnosis not present

## 2021-04-26 DIAGNOSIS — M542 Cervicalgia: Secondary | ICD-10-CM | POA: Diagnosis not present

## 2021-04-26 DIAGNOSIS — M5416 Radiculopathy, lumbar region: Secondary | ICD-10-CM | POA: Diagnosis not present

## 2021-04-26 DIAGNOSIS — M47817 Spondylosis without myelopathy or radiculopathy, lumbosacral region: Secondary | ICD-10-CM | POA: Diagnosis not present

## 2021-04-28 ENCOUNTER — Encounter: Payer: Self-pay | Admitting: Internal Medicine

## 2021-04-29 DIAGNOSIS — M47812 Spondylosis without myelopathy or radiculopathy, cervical region: Secondary | ICD-10-CM | POA: Diagnosis not present

## 2021-04-29 DIAGNOSIS — I10 Essential (primary) hypertension: Secondary | ICD-10-CM | POA: Diagnosis not present

## 2021-07-11 ENCOUNTER — Encounter: Payer: Self-pay | Admitting: Medical

## 2021-07-11 ENCOUNTER — Ambulatory Visit (INDEPENDENT_AMBULATORY_CARE_PROVIDER_SITE_OTHER): Payer: Self-pay | Admitting: Medical

## 2021-07-11 VITALS — BP 170/88 | HR 67 | Temp 98.2°F | Resp 18 | Ht 69.0 in | Wt 118.4 lb

## 2021-07-11 DIAGNOSIS — I10 Essential (primary) hypertension: Secondary | ICD-10-CM

## 2021-07-11 DIAGNOSIS — Z79899 Other long term (current) drug therapy: Secondary | ICD-10-CM

## 2021-07-11 DIAGNOSIS — E876 Hypokalemia: Secondary | ICD-10-CM

## 2021-07-11 DIAGNOSIS — D62 Acute posthemorrhagic anemia: Secondary | ICD-10-CM

## 2021-07-11 DIAGNOSIS — M542 Cervicalgia: Secondary | ICD-10-CM

## 2021-07-11 LAB — CBC WITH DIFFERENTIAL/PLATELET
Basophils Absolute: 0 10*3/uL (ref 0.0–0.1)
Basophils Relative: 0.3 % (ref 0.0–3.0)
Eosinophils Absolute: 0.1 10*3/uL (ref 0.0–0.7)
Eosinophils Relative: 1.4 % (ref 0.0–5.0)
HCT: 39.1 % (ref 39.0–52.0)
Hemoglobin: 12.9 g/dL — ABNORMAL LOW (ref 13.0–17.0)
Lymphocytes Relative: 23.2 % (ref 12.0–46.0)
Lymphs Abs: 1.3 10*3/uL (ref 0.7–4.0)
MCHC: 33 g/dL (ref 30.0–36.0)
MCV: 89.9 fl (ref 78.0–100.0)
Monocytes Absolute: 0.5 10*3/uL (ref 0.1–1.0)
Monocytes Relative: 9.3 % (ref 3.0–12.0)
Neutro Abs: 3.8 10*3/uL (ref 1.4–7.7)
Neutrophils Relative %: 65.8 % (ref 43.0–77.0)
Platelets: 159 10*3/uL (ref 150.0–400.0)
RBC: 4.35 Mil/uL (ref 4.22–5.81)
RDW: 15.6 % — ABNORMAL HIGH (ref 11.5–15.5)
WBC: 5.7 10*3/uL (ref 4.0–10.5)

## 2021-07-11 LAB — COMPREHENSIVE METABOLIC PANEL
ALT: 30 U/L (ref 0–53)
AST: 33 U/L (ref 0–37)
Albumin: 4.1 g/dL (ref 3.5–5.2)
Alkaline Phosphatase: 83 U/L (ref 39–117)
BUN: 10 mg/dL (ref 6–23)
CO2: 29 mEq/L (ref 19–32)
Calcium: 8.7 mg/dL (ref 8.4–10.5)
Chloride: 105 mEq/L (ref 96–112)
Creatinine, Ser: 0.86 mg/dL (ref 0.40–1.50)
GFR: 92.77 mL/min (ref >60.00)
Glucose, Bld: 81 mg/dL (ref 70–99)
Potassium: 3.7 mEq/L (ref 3.5–5.1)
Sodium: 143 mEq/L (ref 135–145)
Total Bilirubin: 0.5 mg/dL (ref 0.2–1.2)
Total Protein: 7 g/dL (ref 6.0–8.3)

## 2021-07-11 LAB — IRON: Iron: 49 ug/dL (ref 42–165)

## 2021-07-11 MED ORDER — GABAPENTIN 100 MG PO CAPS: ORAL_CAPSULE | ORAL | 0 refills | Status: DC

## 2021-07-11 MED ORDER — HYDROCODONE-ACETAMINOPHEN 5-325 MG PO TABS: ORAL_TABLET | ORAL | 0 refills | Status: DC

## 2021-07-11 NOTE — Progress Notes (Addendum)
? ?Subjective:  ? ? Patient ID: Nathaniel Williams, male    DOB: 1960-06-17, 61 y.o.   MRN: [id number] ? ?HPI ? ?Hypertension-  Pt is on  amlodipine, lisinopril and metoprolol. Pt was on chlorthalidone briefly but was only on that briefly. Pt states when pain was controlled systolic bp was 110-211 range.  ?  ?Neck pain and low back pain gets chronic.  Glad to hear that you to get in with the neurosurgeon office. Pt update me that he did get mri. For pain control he was on norco Pt ran out of gabapentin. Pt only got 7 days of norco on last refill at [place]. ? ?Pt lost his job in February. Now has no medical insurance. Pt states worker comp MD did not give any indication of surgery. ? ? ?IMPRESSION:  ? ?1.  Cervical degenerative disc disease most significant at C5-6 with disc osteophyte causing mild canal stenosis with moderate left neural foraminal narrowing.  ? ?2.  C7-T1 left-sided radicular disc extrusion causing severe left neural foraminal narrowing.  ? ? ?He states ran out of pain in med in January.  ?  ?GERD-symptoms controlled with pantoprazole.  Recommend following through with EGD scheduled recently by GI MD. ?  ?Anemia with history of GI Bleed.  ? ? ? ? ?Review of Systems  ?Constitutional:  Negative for chills, fatigue and fever.  ?HENT:  Negative for congestion, drooling, facial swelling and hearing loss.   ?Respiratory:  Negative for cough, chest tightness, shortness of breath and wheezing.   ?Cardiovascular:  Negative for chest pain and palpitations.  ?Gastrointestinal:  Negative for abdominal pain, blood in stool, diarrhea, nausea and rectal pain.  ?Genitourinary:  Negative for difficulty urinating, enuresis, flank pain and hematuria.  ?Musculoskeletal:  Positive for neck pain. Negative for joint swelling.  ?Skin:  Negative for rash.  ?Neurological:  Negative for dizziness, tremors, syncope, facial asymmetry, speech difficulty, weakness, light-headedness and headaches.  ?Psychiatric/Behavioral:  Negative  for behavioral problems.   ? ? ?Past Medical History:  ?Diagnosis Date  ? Achalasia 1996  ? Dx in [place] s/p Heller Myotomy w/gastric pull-thru  ? Alcohol use   ? Aspiration pneumonia ([place])   ? Cervical spondylosis   ? Gastric ulcer   ? Gastritis   ? GI bleed   ? H/O hydrocele   ? of scrotum  ? Hypertension   ? IDA (iron deficiency anemia)   ? ?  ?Social History  ? ?Socioeconomic History  ? Marital status: Legally Separated  ?  Spouse name: Not on file  ? Number of children: Not on file  ? Years of education: Not on file  ? Highest education level: Not on file  ?Occupational History  ? Not on file  ?Tobacco Use  ? Smoking status: Former  ?  Packs/day: 0.25  ?  Years: 18.00  ?  Pack years: 4.50  ?  Types: Cigarettes  ? Smokeless tobacco: Never  ?Vaping Use  ? Vaping Use: Never used  ?Substance and Sexual Activity  ? Alcohol use: Not Currently  ?  Comment: 1.5 L rum every 10 days (per hospital record 10/21/20)  ? Drug use: Never  ? Sexual activity: Not on file  ?Other Topics Concern  ? Not on file  ?Social History Narrative  ? Not on file  ? ?Social Determinants of Health  ? ?Financial Resource Strain: Not on file  ?Food Insecurity: Not on file  ?Transportation Needs: Not on file  ?Physical Activity: Not on file  ?  Stress: Not on file  ?Social Connections: Not on file  ?Intimate Partner Violence: Not on file  ? ? ?Past Surgical History:  ?Procedure Laterality Date  ? BIOPSY  10/21/2020  ? Procedure: BIOPSY;  Surgeon: Jerene Bears, MD;  Location[facility] END[facility]COPY;  Service: Gastroenterology;;  ? ESOPHAGOGASTRODUODENOSCOPY (EGD) WITH PROPOFOL N/A 10/21/2020  ? Procedure: ESOPHAGOGASTRODUODENOSCOPY (EGD) WITH PROPOFOL;  Surgeon: Jerene Bears, MD;  Location: [facility] ENDOSCOPY;  Service: Gastroenterology;  Laterality: N/A;  ? [place]  ? in [place]; w/gastric pull thru  ? HEMOSTASIS CLIP PLACEMENT  10/21/2020  ? Procedure: HEMOSTASIS CLIP PLACEMENT;  Surgeon: Jerene Bears, MD;  Location: [facility] ENDOSCOPY;  Service:  Gastroenterology;;  ? ? ?Family History  ?Problem Relation Age of Onset  ? Cervical cancer Mother   ? Alzheimer's disease Father   ? Stomach cancer Neg Hx   ? Esophageal cancer Neg Hx   ? Pancreatic cancer Neg Hx   ? ? ?No Known Allergies ? ?Current Outpatient Medications on File Prior to Visit  ?Medication Sig Dispense Refill  ? amLODipine (NORVASC) 5 MG tablet Take 1 tablet (5 mg total) by mouth daily. 90 tablet 0  ? Ascorbic Acid (VITAMIN C) 1000 MG tablet Take 1,000 mg by mouth daily.    ? B Complex-C (SUPER B COMPLEX PO) Take 1 tablet by mouth daily.    ? Camphor-Menthol-Methyl Sal (TIGER BALM MUSCLE RUB EX) Apply 1 application. topically as needed (back pain).    ? chlorthalidone (HYGROTON) 25 MG tablet Take 1 tablet (25 mg total) by mouth daily. 30 tablet 0  ? Cholecalciferol (VITAMIN D3) 50 MCG (2000 UT) capsule Take 2,000 Units by mouth daily.    ? diclofenac (VOLTAREN) 75 MG EC tablet Take 75 mg by mouth 2 (two) times daily.    ? gabapentin (NEURONTIN) 100 MG capsule 1 tab po prior to sleep. 30 capsule 0  ? HYDROcodone-acetaminophen (NORCO) 5-325 MG tablet 1-2 tab po daily for severe pain 30 tablet 0  ? HYDROcodone-acetaminophen (NORCO) 5-325 MG tablet 1 tab every 12 hours as needed severe pain 30 tablet 0  ? lidocaine (LIDODERM) 5 % Place 1 patch onto the skin daily as needed (back pain). Remove & Discard patch within 12 hours or as directed by MD    ? lisinopril (ZESTRIL) 10 MG tablet 2 tab po daily 180 tablet 0  ? Misc Natural Products (OSTEO BI-FLEX ADV TRIPLE ST PO) Take 1 tablet by mouth daily.    ? OVER THE COUNTER MEDICATION Take 1 tablet by mouth daily. [place] MEGA MEN ENERGY AND METABOBLISM    ? pantoprazole (PROTONIX) 40 MG tablet Take 1 tablet (40 mg total) by mouth daily. 90 tablet 3  ? polyvinyl alcohol (LIQUIFILM TEARS) 1.4 % ophthalmic solution Place 1 drop into both eyes as needed for dry eyes.    ? potassium chloride (KLOR-CON) 10 MEQ tablet Take 1 tablet (10 mEq total) by mouth daily. 30  tablet 1  ? Turmeric Curcumin 500 MG CAPS Take 1 capsule by mouth daily.    ? vitamin E 180 MG (400 UNITS) capsule Take 400 Units by mouth daily.    ? metoprolol succinate (TOPROL-XL) 50 MG 24 hr tablet Take 1 tablet (50 mg total) by mouth daily. TAKE WITH OR IMMEDIATELY FOLLOWING A MEAL. 30 tablet 11  ? ?No current facility-administered medications on file prior to visit.  ? ? ?BP (!) 170/88   Pulse 67   Temp 98.2 ?F (36.8 ?C)  Resp 18   Ht '5\' 9"'$  (1.753 m)   Wt 118 lb 6.4 oz (53.7 kg)   SpO2 100%   BMI 17.48 kg/m?  ?  ?   ?Objective:  ? Physical Exam ? ?General ?Mental Status- Alert. General Appearance- Not in acute distress.  ? ?Skin ?General: Color- Normal Color. Moisture- Normal Moisture. ? ?Neck ?Carotid Arteries- Normal color. Moisture- Normal Moisture. No carotid bruits. No JVD. ? ?Chest and Lung Exam ?Auscultation: ?Breath Sounds:-Normal. ? ?Cardiovascular ?Auscultation:Rythm- Regular. ?Murmurs & Other Heart Sounds:Auscultation of the heart reveals- No Murmurs. ? ?Abdomen ?Inspection:-Inspeection Normal. ?Palpation/Percussion:Note:No mass. Palpation and Percussion of the abdomen reveal- Non Tender, Non Distended + BS, no rebound or guarding. ? ? ? ?Neurologic ?Cranial Nerve exam:- CN III-XII intact(No nystagmus), symmetric smile. ?Drift Test:- No drift. ?Romberg Exam:- Negative.  ?Heal to Toe Gait exam:-Normal. ?Finger to Nose:- Normal/Intact ?Strength:- 5/5 equal and symmetric strength both upper and lower extremities.  ? ? ?   ?Assessment & Plan:  ? ?Patient Instructions  ?Hypertension-blood pressure moderate to severe high today but you indicated that you are having high level of neck pain.  You have been without narcotics since end of January.  No gross motor or sensory function deficits.  Recommend that you increase your amlodipine to 10 mg daily.  Increase lisinopril to 30 mg daily.Continue Toprol XL 50 mg daily.  Will follow your metabolic panel.  Check your blood pressure daily and give me an  update by MyChart or calling by Wednesday morning.  I will get metabolic panel check kidney function and potassium level.  Adding on chlorthalidone might be an option. ? ?Neck pain with cervical degenerative disc

## 2021-07-11 NOTE — Patient Instructions (Addendum)
Hypertension-blood pressure moderate to severe high today but you indicated that you are having high level of neck pain.  You have been without narcotics since end of January.  No gross motor or sensory function deficits.  Recommend that you increase your amlodipine to 10 mg daily.  Increase lisinopril to 30 mg daily.Continue Toprol XL 50 mg daily.  Will follow your metabolic panel.  Check your blood pressure daily and give me an update by MyChart or calling by Wednesday morning.  I will get metabolic panel check kidney function and potassium level.  Adding on chlorthalidone might be an option. ? ?Neck pain with cervical degenerative disc disease most significant at C5-6 with disc osteophyte causing mild canal stenosis with moderate left neural foraminal narrowing.  Pain is becoming chronic and recently lost insurance and on review you report neurosurgeon did not give any surgical solution.  Will refill your Norco 5-325 prescription number 30 tablets to use once daily.  Also prescribing gabapentin to use at night. ? ?For GERD with a history of ulcer continue pantoprazole. ? ?For history of anemia with above also will check your blood volume. ? ?Follow-up in 1 week or sooner if needed. ?

## 2021-07-18 ENCOUNTER — Ambulatory Visit: Payer: Medicaid Other | Admitting: Medical
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
## 2022-01-09 ENCOUNTER — Other Ambulatory Visit: Payer: Self-pay | Admitting: Medical

## 2022-01-11 ENCOUNTER — Ambulatory Visit (INDEPENDENT_AMBULATORY_CARE_PROVIDER_SITE_OTHER): Payer: Self-pay | Admitting: Medical

## 2022-01-11 ENCOUNTER — Encounter: Payer: Self-pay | Admitting: Medical

## 2022-01-11 VITALS — BP 170/85 | HR 85 | Temp 98.0°F | Resp 18 | Ht 69.0 in | Wt 127.0 lb

## 2022-01-11 DIAGNOSIS — D649 Anemia, unspecified: Secondary | ICD-10-CM

## 2022-01-11 DIAGNOSIS — I10 Essential (primary) hypertension: Secondary | ICD-10-CM

## 2022-01-11 DIAGNOSIS — Z79899 Other long term (current) drug therapy: Secondary | ICD-10-CM

## 2022-01-11 DIAGNOSIS — M542 Cervicalgia: Secondary | ICD-10-CM

## 2022-01-11 MED ORDER — LISINOPRIL 40 MG PO TABS: 40.0000 mg | ORAL_TABLET | Freq: Every day | ORAL | 3 refills | Status: DC

## 2022-01-11 MED ORDER — HYDROCODONE-ACETAMINOPHEN 5-325 MG PO TABS: 1.0000 | ORAL_TABLET | Freq: Four times a day (QID) | ORAL | 0 refills | Status: DC | PRN

## 2022-01-11 MED ORDER — METOPROLOL SUCCINATE ER 50 MG PO TB24: 50.0000 mg | ORAL_TABLET | Freq: Every day | ORAL | 3 refills | Status: DC

## 2022-01-11 NOTE — Patient Instructions (Addendum)
Htn- increasing your lisinopril to 40 mg daily and refilling your toprol xl 50 mg daily dose. Follow up nurse bp check in one week. Will see if 3rd med needs to be added. If any cardiac or neurologic signs/symptoms during interim be seen in ED.  For neck pain chronic up to date on contract and uds. Refilled your norco today.  [name]- continue protonix.  Anemia- will get labs to follow.  Will get cmp, iron and cbc.  Follow up in one month or sooner if needed

## 2022-01-11 NOTE — Progress Notes (Signed)
Subjective:    Patient ID: Nathaniel Williams, male    DOB: Nov 08, 1960, 61 y.o.   MRN: [id number]  HPI  Htn- pt  was on lisinopril 30 mg daily and  toprol xl 50 mg daily. Ran out of lisinopril 4 days ago. Still has toprol xl. No cardiac or neurologic signs/symptoms.  Also has neck pain. Last office visit.   "Neck pain with cervical degenerative disc disease most significant at C5-6 with disc osteophyte causing mild canal stenosis with moderate left neural foraminal narrowing.  Pain is becoming chronic and recently lost insurance and on review you report neurosurgeon did not give any surgical solution.  Will refill your Norco 5-325 prescription number 30 tablets to use once daily.  Also prescribing gabapentin to use at night."  Regarding neck pain back in 02-2021 he did give uds. Since April he did get last norco rx in April. Then pain eased off but now recurrent past 3 months.  [name]- pt symptoms controlled on pantoprazole.  Pt has medicaid. Pt was assigned to another provider. He has plans to get card switched to our practice.    Review of Systems  Constitutional:  Negative for chills, fatigue and fever.  Respiratory:  Negative for cough, chest tightness, shortness of breath and wheezing.   Cardiovascular:  Negative for chest pain and palpitations.  Gastrointestinal:  Negative for abdominal pain, diarrhea and nausea.  Genitourinary:  Negative for dysuria, enuresis, flank pain, genital sores and hematuria.  Musculoskeletal:  Positive for neck pain. Negative for back pain.  Skin:  Negative for rash.    Past Medical History:  Diagnosis Date   Achalasia 1996   Dx in [place] s/p Heller Myotomy w/gastric pull-thru   Alcohol use    Aspiration pneumonia ([place])    Cervical spondylosis    Gastric ulcer    Gastritis    GI bleed    H/O hydrocele    of scrotum   Hypertension    IDA (iron deficiency anemia)      Social History   Socioeconomic History   Marital status: Legally  Separated    Spouse name: Not on file   Number of children: Not on file   Years of education: Not on file   Highest education level: Not on file  Occupational History   Not on file  Tobacco Use   Smoking status: Former    Packs/day: 0.25    Years: 18.00    Total pack years: 4.50    Types: Cigarettes   Smokeless tobacco: Never  Vaping Use   Vaping Use: Never used  Substance and Sexual Activity   Alcohol use: Not Currently    Comment: 1.5 L rum every 10 days (per hospital record 10/21/20)   Drug use: Never   Sexual activity: Not on file  Other Topics Concern   Not on file  Social History Narrative   Not on file   Social Determinants of Health   Financial Resource Strain: Not on file  Food Insecurity: Not on file  Transportation Needs: Not on file  Physical Activity: Not on file  Stress: Not on file  Social Connections: Not on file  Intimate Partner Violence: Not on file    Past Surgical History:  Procedure Laterality Date   BIOPSY  10/21/2020   Procedure: BIOPSY;  Surgeon: Jerene Bears, MD;  Location: [facility] ENDOSCOPY;  Service: Gastroenterology;;   ESOPHAGOGASTRODUODENOSCOPY (EGD) WITH PROPOFOL N/A 10/21/2020   Procedure: ESOPHAGOGASTRODUODENOSCOPY (EGD) WITH PROPOFOL;  Surgeon: Jerene Bears,  MD;  Location: [place];  Service: Gastroenterology;  Laterality: N/A;   [place]   in [place]; w/gastric pull thru   [place]  10/21/2020   Procedure: HEMOSTASIS CLIP PLACEMENT;  Surgeon: Jerene Bears, MD;  Location: [facility] ENDOSCOPY;  Service: Gastroenterology;;    Family History  Problem Relation Age of Onset   Cervical cancer Mother    Alzheimer's disease Father    Stomach cancer Neg Hx    Esophageal cancer Neg Hx    Pancreatic cancer Neg Hx     No Known Allergies  Current Outpatient Medications on File Prior to Visit  Medication Sig Dispense Refill   amLODipine (NORVASC) 5 MG tablet Take 1 tablet (5 mg total) by mouth daily. 90 tablet 0    Ascorbic Acid (VITAMIN C) 1000 MG tablet Take 1,000 mg by mouth daily.     B Complex-C (SUPER B COMPLEX PO) Take 1 tablet by mouth daily.     Camphor-Menthol-Methyl Sal (TIGER BALM MUSCLE RUB EX) Apply 1 application. topically as needed (back pain).     chlorthalidone (HYGROTON) 25 MG tablet Take 1 tablet (25 mg total) by mouth daily. 30 tablet 0   Cholecalciferol (VITAMIN D3) 50 MCG (2000 UT) capsule Take 2,000 Units by mouth daily.     diclofenac (VOLTAREN) 75 MG EC tablet Take 75 mg by mouth 2 (two) times daily.     gabapentin (NEURONTIN) 100 MG capsule 1 tab po prior to sleep. 30 capsule 0   HYDROcodone-acetaminophen (NORCO) 5-325 MG tablet 1 tab every 12 hours as needed severe pain 30 tablet 0   HYDROcodone-acetaminophen (NORCO) 5-325 MG tablet 1-2 tab po daily for severe pain 30 tablet 0   lidocaine (LIDODERM) 5 % Place 1 patch onto the skin daily as needed (back pain). Remove & Discard patch within 12 hours or as directed by MD     lisinopril (ZESTRIL) 10 MG tablet Take 1 tablet by mouth once daily 30 tablet 0   metoprolol succinate (TOPROL-XL) 50 MG 24 hr tablet Take 1 tablet (50 mg total) by mouth daily. TAKE WITH OR IMMEDIATELY FOLLOWING A MEAL. 30 tablet 11   Misc Natural Products (OSTEO BI-FLEX ADV TRIPLE ST PO) Take 1 tablet by mouth daily.     OVER THE COUNTER MEDICATION Take 1 tablet by mouth daily. [employer] MEGA MEN ENERGY AND METABOBLISM     pantoprazole (PROTONIX) 40 MG tablet Take 1 tablet (40 mg total) by mouth daily. 90 tablet 3   polyvinyl alcohol (LIQUIFILM TEARS) 1.4 % ophthalmic solution Place 1 drop into both eyes as needed for dry eyes.     potassium chloride (KLOR-CON) 10 MEQ tablet Take 1 tablet (10 mEq total) by mouth daily. 30 tablet 1   Turmeric Curcumin 500 MG CAPS Take 1 capsule by mouth daily.     vitamin E 180 MG (400 UNITS) capsule Take 400 Units by mouth daily.     No current facility-administered medications on file prior to visit.    BP (!) 170/85   Pulse  85   Temp 98 F (36.7 C)   Resp 18   Ht '5\' 9"'$  (1.753 m)   Wt 127 lb (57.6 kg)   SpO2 100%   BMI 18.75 kg/m        Objective:   Physical Exam  General Mental Status- Alert. General Appearance- Not in acute distress.   Skin General: Color- Normal Color. Moisture- Normal Moisture.  Neck Carotid Arteries- Normal color. Moisture-  Normal Moisture. No carotid bruits. No JVD.  Chest and Lung Exam Auscultation: Breath Sounds:-Normal.  Cardiovascular Auscultation:Rythm- Regular. Murmurs & Other Heart Sounds:Auscultation of the heart reveals- No Murmurs.  Abdomen Inspection:-Inspeection Normal. Palpation/Percussion:Note:No mass. Palpation and Percussion of the abdomen reveal- Non Tender, Non Distended + BS, no rebound or guarding.   Neurologic Cranial Nerve exam:- CN III-XII intact(No nystagmus), symmetric smile. Strength:- 5/5 equal and symmetric strength both upper and lower extremities.       Assessment & Plan:   Patient Instructions  Htn- increasing your lisinopril to 40 mg daily and refilling your toprol xl 50 mg daily dose. Follow up nurse bp check in one week. Will see if 3rd med needs to be added. If any cardiac or neurologic signs/symptoms during interim be seen in ED.  For neck pain chronic up to date on contract and uds. Refilled your norco today.  [name]- continue protonix.  Anemia- will get labs to follow.  Will get cmp, iron and cbc.  Follow up in one month or sooner if needed   [employer], PA-C

## 2022-01-12 LAB — COMPREHENSIVE METABOLIC PANEL
ALT: 19 U/L (ref 0–53)
AST: 28 U/L (ref 0–37)
Albumin: 4.3 g/dL (ref 3.5–5.2)
Alkaline Phosphatase: 62 U/L (ref 39–117)
BUN: 14 mg/dL (ref 6–23)
CO2: 28 mEq/L (ref 19–32)
Calcium: 9.5 mg/dL (ref 8.4–10.5)
Chloride: 101 mEq/L (ref 96–112)
Creatinine, Ser: 0.93 mg/dL (ref 0.40–1.50)
GFR: 87.66 mL/min (ref >60.00)
Glucose, Bld: 96 mg/dL (ref 70–99)
Potassium: 3.4 mEq/L — ABNORMAL LOW (ref 3.5–5.1)
Sodium: 140 mEq/L (ref 135–145)
Total Bilirubin: 0.7 mg/dL (ref 0.2–1.2)
Total Protein: 7.3 g/dL (ref 6.0–8.3)

## 2022-01-12 LAB — CBC WITH DIFFERENTIAL/PLATELET
Basophils Absolute: 0.1 10*3/uL (ref 0.0–0.1)
Basophils Relative: 0.9 % (ref 0.0–3.0)
Eosinophils Absolute: 0.1 10*3/uL (ref 0.0–0.7)
Eosinophils Relative: 1.8 % (ref 0.0–5.0)
HCT: 39.1 % (ref 39.0–52.0)
Hemoglobin: 12.7 g/dL — ABNORMAL LOW (ref 13.0–17.0)
Lymphocytes Relative: 23.7 % (ref 12.0–46.0)
Lymphs Abs: 1.4 10*3/uL (ref 0.7–4.0)
MCHC: 32.4 g/dL (ref 30.0–36.0)
MCV: 88.8 fl (ref 78.0–100.0)
Monocytes Absolute: 0.8 10*3/uL (ref 0.1–1.0)
Monocytes Relative: 13.1 % — ABNORMAL HIGH (ref 3.0–12.0)
Neutro Abs: 3.7 10*3/uL (ref 1.4–7.7)
Neutrophils Relative %: 60.5 % (ref 43.0–77.0)
Platelets: 304 10*3/uL (ref 150.0–400.0)
RBC: 4.41 Mil/uL (ref 4.22–5.81)
RDW: 15 % (ref 11.5–15.5)
WBC: 6.1 10*3/uL (ref 4.0–10.5)

## 2022-01-12 LAB — IRON: Iron: 55 ug/dL (ref 42–165)

## 2022-02-01 ENCOUNTER — Other Ambulatory Visit: Payer: Self-pay | Admitting: Medical

## 2022-02-01 NOTE — Telephone Encounter (Signed)
Medication: HYDROcodone-acetaminophen (NORCO) 5-325 MG  Has the patient contacted their pharmacy? No.   Preferred Pharmacy:   [facility] [place] [place], [place] [address] Phone: [phone]  Fax: [phone]

## 2022-02-01 NOTE — Telephone Encounter (Addendum)
Requesting: NORCO Contract:03/09/21 UDS:03/09/21 Last Visit:01/11/22 Next Visit:02/14/22 Last Refill:01/11/22  Please Advise   Rx refill sent to pt pharmacy.  Mackie Pai, PA-C

## 2022-02-04 MED ORDER — HYDROCODONE-ACETAMINOPHEN 5-325 MG PO TABS: 1.0000 | ORAL_TABLET | Freq: Four times a day (QID) | ORAL | 0 refills | Status: DC | PRN

## 2022-02-08 ENCOUNTER — Telehealth: Payer: Self-pay | Admitting: Medical

## 2022-02-08 NOTE — Telephone Encounter (Signed)
Medication:   HYDROcodone-acetaminophen (NORCO) 5-325 MG tablet [[id number]]   Has the patient contacted their pharmacy? No. (If no, request that the patient contact the pharmacy for the refill.) (If yes, when and what did the pharmacy advise?)  Preferred Pharmacy (with phone number or street name):   [facility] [address], [place] [address] Phone: [phone]  Fax: [phone]  Agent: Please be advised that RX refills may take up to 3 business days. We ask that you follow-up with your pharmacy.

## 2022-02-08 NOTE — Telephone Encounter (Signed)
Pt called and lvm to notify pt medication was sent in on 02/04/22 and to call us back before 5 if it was an issue with pharmacy

## 2022-02-13 ENCOUNTER — Ambulatory Visit: Payer: Medicaid Other | Admitting: Medical

## 2022-02-14 ENCOUNTER — Ambulatory Visit: Payer: Medicaid Other | Admitting: Medical

## 2022-02-17 ENCOUNTER — Encounter: Payer: Self-pay | Admitting: Medical

## 2022-02-17 ENCOUNTER — Ambulatory Visit (INDEPENDENT_AMBULATORY_CARE_PROVIDER_SITE_OTHER): Payer: Medicaid Other | Admitting: Medical

## 2022-02-17 ENCOUNTER — Other Ambulatory Visit (HOSPITAL_BASED_OUTPATIENT_CLINIC_OR_DEPARTMENT_OTHER): Payer: Self-pay

## 2022-02-17 VITALS — BP 170/97 | HR 70 | Temp 98.2°F | Resp 18 | Ht 69.0 in | Wt 130.0 lb

## 2022-02-17 DIAGNOSIS — I10 Essential (primary) hypertension: Secondary | ICD-10-CM | POA: Diagnosis not present

## 2022-02-17 DIAGNOSIS — M542 Cervicalgia: Secondary | ICD-10-CM | POA: Diagnosis not present

## 2022-02-17 MED ORDER — HYDRALAZINE HCL 25 MG PO TABS
25.0000 mg | ORAL_TABLET | Freq: Three times a day (TID) | ORAL | 11 refills | Status: DC
  Filled 2022-02-17: qty 90, 30d supply, fill #0
  Filled 2022-05-15: qty 90, 30d supply, fill #1

## 2022-02-17 NOTE — Patient Instructions (Addendum)
Htn- bp is very high today despite using lisnoprl and toprol. Will add hydralazine 25 mg three times daily to your current regimen for htn. Please get scheduled for nurse bp check in one week as need to verify bp is coming down. If you have any cardiac or neurologic signs/symptoms then be seen in the emergency department.  Neck pain severe without pain medication. Will get c spine xray and ask you get me copy of prior mri done thru worker comp orthopedist. When you need refill of norco recommend getting it filled at [facility] Presently continue norco 1 tab daily.  For gerd continue protonix.  Follow up next week nurse bp check or sooner if needed.

## 2022-02-17 NOTE — Progress Notes (Signed)
Subjective:    Patient ID: Nathaniel Williams, male    DOB: 1960/11/05, 61 y.o.   MRN: [id number]  HPI  Pt in for follow up.  Pt has very high blood pressure.   Last visit note A/P  "Htn- increasing your lisinopril to 40 mg daily and refilling your toprol xl 50 mg daily dose. Follow up nurse bp check in one week. Will see if 3rd med needs to be added. If any cardiac or neurologic signs/symptoms during interim be seen in ED.   For neck pain chronic up to date on contract and uds. Refilled your norco today.   [name]- continue protonix.   Anemia- will get labs to follow.   Will get cmp, iron and cbc.   Follow up in one month or sooner if needed"   Pt did not come in for nurse blood pressure follow up as I had asked. Pt has been checking his bp at home. His levels have been 170-180/100. Very rare for his diastoic to be less than 100. He thinks bp runs higher when in severe pain. He has been taking lisnoprl and toprol on daily basis. Htn- increasing your lisinopril to 40 mg daily and refilling your toprol xl 50 mg daily dose. Follow up nurse bp check in one week. Will see if 3rd med needs to be added. If any cardiac or neurologic signs/symptoms during interim be seen in ED.   For neck pain chronic up to date on contract and uds. Refilled your norco today.   [name]- continue protonix.   Anemia- will get labs to follow.   Will get cmp, iron and cbc.   Follow up in one month or sooner if needed   Pt has severe neck pain. He states [facility] delayed his prescription fill. I had written on 02-04-2022. They filed med on 02-10-2022.  Pt original pain onset was at work. He was seeing specialist since worker comp sent pt there.  Pt is using on 1 norco a day.     Review of Systems  Constitutional:  Negative for chills and fever.  Respiratory:  Negative for cough, chest tightness, shortness of breath and wheezing.   Cardiovascular:  Negative for chest pain and palpitations.   Gastrointestinal:  Negative for abdominal pain, blood in stool and constipation.  Genitourinary:  Negative for dysuria and flank pain.  Musculoskeletal:  Positive for neck pain. Negative for back pain and myalgias.  Neurological:  Negative for dizziness, seizures, light-headedness and headaches.  Hematological:  Negative for adenopathy. Does not bruise/bleed easily.  Psychiatric/Behavioral:  Negative for behavioral problems and confusion. The patient is nervous/anxious.    Past Medical History:  Diagnosis Date   Achalasia 1996   Dx in [place] s/p Heller Myotomy w/gastric pull-thru   Alcohol use    Aspiration pneumonia ([place])    Cervical spondylosis    Gastric ulcer    Gastritis    GI bleed    H/O hydrocele    of scrotum   Hypertension    IDA (iron deficiency anemia)      Social History   Socioeconomic History   Marital status: Legally Separated    Spouse name: Not on file   Number of children: Not on file   Years of education: Not on file   Highest education level: Not on file  Occupational History   Not on file  Tobacco Use   Smoking status: Former    Packs/day: 0.25    Years: 18.00    Total  pack years: 4.50    Types: Cigarettes   Smokeless tobacco: Never  Vaping Use   Vaping Use: Never used  Substance and Sexual Activity   Alcohol use: Not Currently    Comment: 1.5 L rum every 10 days (per hospital record 10/21/20)   Drug use: Never   Sexual activity: Not on file  Other Topics Concern   Not on file  Social History Narrative   Not on file   Social Determinants of Health   Financial Resource Strain: Not on file  Food Insecurity: Not on file  Transportation Needs: Not on file  Physical Activity: Not on file  Stress: Not on file  Social Connections: Not on file  Intimate Partner Violence: Not on file    Past Surgical History:  Procedure Laterality Date   BIOPSY  10/21/2020   Procedure: BIOPSY;  Surgeon: Jerene Bears, MD;  Location[facility] ENDOSCOPY;   Service: Gastroenterology;;   ESOPHAGOGASTRODUODENOSCOPY (EGD) WITH PROPOFOL N/A 10/21/2020   Procedure: ESOPHAGOGASTRODUODENOSCOPY (EGD) WITH PROPOFOL;  Surgeon: Jerene Bears, MD;  Location: [facility];  Service: Gastroenterology;  Laterality: N/A;   [place]   in [place]; w/gastric pull thru   [place]  10/21/2020   Procedure: HEMOSTASIS CLIP PLACEMENT;  Surgeon: Jerene Bears, MD;  Location: [facility] ENDOSCOPY;  Service: Gastroenterology;;    Family History  Problem Relation Age of Onset   Cervical cancer Mother    Alzheimer's disease Father    Stomach cancer Neg Hx    Esophageal cancer Neg Hx    Pancreatic cancer Neg Hx     No Known Allergies  Current Outpatient Medications on File Prior to Visit  Medication Sig Dispense Refill   Ascorbic Acid (VITAMIN C) 1000 MG tablet Take 1,000 mg by mouth daily.     B Complex-C (SUPER B COMPLEX PO) Take 1 tablet by mouth daily.     Camphor-Menthol-Methyl Sal (TIGER BALM MUSCLE RUB EX) Apply 1 application. topically as needed (back pain).     Cholecalciferol (VITAMIN D3) 50 MCG (2000 UT) capsule Take 2,000 Units by mouth daily.     diclofenac (VOLTAREN) 75 MG EC tablet Take 75 mg by mouth 2 (two) times daily.     gabapentin (NEURONTIN) 100 MG capsule 1 tab po prior to sleep. 30 capsule 0   HYDROcodone-acetaminophen (NORCO) 5-325 MG tablet Take 1 tablet by mouth every 6 (six) hours as needed for moderate pain. 30 tablet 0   lidocaine (LIDODERM) 5 % Place 1 patch onto the skin daily as needed (back pain). Remove & Discard patch within 12 hours or as directed by MD     lisinopril (ZESTRIL) 40 MG tablet Take 1 tablet (40 mg total) by mouth daily. 90 tablet 3   metoprolol succinate (TOPROL-XL) 50 MG 24 hr tablet Take 1 tablet (50 mg total) by mouth daily. Take with or immediately following a meal. 90 tablet 3   Misc Natural Products (OSTEO BI-FLEX ADV TRIPLE ST PO) Take 1 tablet by mouth daily.     OVER THE COUNTER  MEDICATION Take 1 tablet by mouth daily. GNC MEGA MEN ENERGY AND METABOBLISM     pantoprazole (PROTONIX) 40 MG tablet Take 1 tablet (40 mg total) by mouth daily. 90 tablet 3   polyvinyl alcohol (LIQUIFILM TEARS) 1.4 % ophthalmic solution Place 1 drop into both eyes as needed for dry eyes.     potassium chloride (KLOR-CON) 10 MEQ tablet Take 1 tablet (10 mEq total) by mouth  daily. 30 tablet 1   Turmeric Curcumin 500 MG CAPS Take 1 capsule by mouth daily.     vitamin E 180 MG (400 UNITS) capsule Take 400 Units by mouth daily.     metoprolol succinate (TOPROL-XL) 50 MG 24 hr tablet Take 1 tablet (50 mg total) by mouth daily. TAKE WITH OR IMMEDIATELY FOLLOWING A MEAL. 30 tablet 11   No current facility-administered medications on file prior to visit.    BP (!) 170/97   Pulse 70   Temp 98.2 F (36.8 C)   Resp 18   Ht '5\' 9"'$  (1.753 m)   Wt 130 lb (59 kg)   SpO2 100%   BMI 19.20 kg/m       Objective:   Physical Exam  General Mental Status- Alert. General Appearance- Not in acute distress.   Skin General: Color- Normal Color. Moisture- Normal Moisture.  Neck Carotid Arteries- Normal color. Moisture- Normal Moisture. No carotid bruits. No JVD.  Chest and Lung Exam Auscultation: Breath Sounds:-Normal.  Cardiovascular Auscultation:Rythm- Regular. Murmurs & Other Heart Sounds:Auscultation of the heart reveals- No Murmurs.  Abdomen Inspection:-Inspeection Normal. Palpation/Percussion:Note:No mass. Palpation and Percussion of the abdomen reveal- Non Tender, Non Distended + BS, no rebound or guarding.    Neurologic Cranial Nerve exam:- CN III-XII intact(No nystagmus), symmetric smile. Finger to Nose:- Normal/Intact Strength:- 5/5 equal and symmetric strength both upper and lower extremities.       Assessment & Plan:  Htn- bp is very high today despite using lisnoprl and toprol. Will add hydralazine 25 mg three times daily to your current regimen for htn. Please get scheduled  for nurse bp check in one week as need to verify bp is coming down. If you have any cardiac or neurologic signs/symptoms then be seen in the emergency department.  Neck pain severe without pain medication. Will get c spine xray and ask you get me copy of prior mri done thru worker comp orthopedist. When you need refill of norco recommend getting it filled at [facility] Presently continue norco 1 tab daily.  For gerd continue protonix.  Follow up next week nurse bp check or sooner if needed.

## 2022-02-24 ENCOUNTER — Ambulatory Visit (INDEPENDENT_AMBULATORY_CARE_PROVIDER_SITE_OTHER): Payer: Medicaid Other

## 2022-02-24 VITALS — BP 130/82

## 2022-02-24 DIAGNOSIS — I10 Essential (primary) hypertension: Secondary | ICD-10-CM | POA: Diagnosis not present

## 2022-02-24 NOTE — Progress Notes (Addendum)
Pt here for Blood pressure check per   Pt currently takes: Lisinopril 40 mg 1 tablet daily and Metoprolol 50 mg 1 tablet daily Hydralazine 25 mg 1 tablet 3 time daily   Pt reports compliance with medication.  BP today @ = 130/82 HR =73  Pt advised per: Continue same dozes and follow up one month with pcp  Mackie Pai, PA-C

## 2022-03-24 ENCOUNTER — Other Ambulatory Visit: Payer: Self-pay | Admitting: Medical

## 2022-03-24 NOTE — Telephone Encounter (Signed)
Prescription Request  03/24/2022  Is this a "Controlled Substance" medicine? Yes  LOV: 02/17/2022  What is the name of the medication or equipment?  HYDROcodone-acetaminophen (NORCO) 5-325 MG tablet   Have you contacted your pharmacy to request a refill? No   Which pharmacy would you like this sent to?   [facility] [address] [place], [place]  [address] Phone: [phone]  Fax: [phone]    Patient notified that their request is being sent to the clinical staff for review and that they should receive a response within 2 business days.   Please advise at Mobile [phone] (mobile)

## 2022-03-24 NOTE — Telephone Encounter (Signed)
Requesting: NORCO Contract:03/09/21 UDS:03/09/21 Last Visit:02/17/22 Next Visit:03/27/22 Last Refill:02/04/22  Please Advise    Contract and UDS will updated on 03/27/22 appointment

## 2022-03-27 ENCOUNTER — Encounter: Payer: Self-pay | Admitting: Medical

## 2022-03-27 ENCOUNTER — Other Ambulatory Visit (HOSPITAL_BASED_OUTPATIENT_CLINIC_OR_DEPARTMENT_OTHER): Payer: Self-pay

## 2022-03-27 ENCOUNTER — Ambulatory Visit (INDEPENDENT_AMBULATORY_CARE_PROVIDER_SITE_OTHER): Payer: Medicaid Other | Admitting: Medical

## 2022-03-27 VITALS — BP 137/80 | HR 70 | Resp 18 | Ht 69.0 in | Wt 128.2 lb

## 2022-03-27 DIAGNOSIS — Z79899 Other long term (current) drug therapy: Secondary | ICD-10-CM | POA: Diagnosis not present

## 2022-03-27 MED ORDER — HYDROCODONE-ACETAMINOPHEN 5-325 MG PO TABS
1.0000 | ORAL_TABLET | Freq: Four times a day (QID) | ORAL | 0 refills | Status: DC | PRN
  Filled 2022-03-27: qty 30, 5d supply, fill #0

## 2022-03-27 NOTE — Progress Notes (Signed)
Subjective:    Patient ID: Nathaniel Williams, male    DOB: 1960/10/02, 62 y.o.   MRN: [id number]  HPI  Pt in for follow up for chronic neck pain. He needed to update his controlled med contract and to give uds.   "Neck pain severe without pain medication. Will get c spine xray and ask you get me copy of prior mri done thru worker comp orthopedist. When you need refill of norco recommend getting it filled at [facility] Presently continue norco 1 tab daily."  Pt bp mild elevated initially. On recheck was 137/80. Pt is on lisinopri, toprol and I added hydralazine early December.   Review of Systems  Constitutional:  Negative for chills and fever.  Respiratory:  Negative for cough, chest tightness, shortness of breath and wheezing.   Cardiovascular:  Negative for chest pain and palpitations.  Gastrointestinal:  Negative for abdominal pain, blood in stool and constipation.  Genitourinary:  Negative for dysuria and flank pain.  Musculoskeletal:  Positive for neck pain. Negative for back pain and myalgias.  Neurological:  Negative for dizziness, seizures, light-headedness and headaches.  Hematological:  Negative for adenopathy. Does not bruise/bleed easily.  Psychiatric/Behavioral:  Negative for behavioral problems and confusion. The patient is not nervous/anxious.    Past Medical History:  Diagnosis Date   Achalasia 1996   Dx in [place] s/p Heller Myotomy w/gastric pull-thru   Alcohol use    Aspiration pneumonia ([place])    Cervical spondylosis    Gastric ulcer    Gastritis    GI bleed    H/O hydrocele    of scrotum   Hypertension    IDA (iron deficiency anemia)      Social History   Socioeconomic History   Marital status: Legally Separated    Spouse name: Not on file   Number of children: Not on file   Years of education: Not on file   Highest education level: Not on file  Occupational History   Not on file  Tobacco Use   Smoking status: Former    Packs/day: 0.25     Years: 18.00    Total pack years: 4.50    Types: Cigarettes   Smokeless tobacco: Never  Vaping Use   Vaping Use: Never used  Substance and Sexual Activity   Alcohol use: Not Currently    Comment: 1.5 L rum every 10 days (per hospital record 10/21/20)   Drug use: Never   Sexual activity: Not on file  Other Topics Concern   Not on file  Social History Narrative   Not on file   Social Determinants of Health   Financial Resource Strain: Not on file  Food Insecurity: Not on file  Transportation Needs: Not on file  Physical Activity: Not on file  Stress: Not on file  Social Connections: Not on file  Intimate Partner Violence: Not on file    Past Surgical History:  Procedure Laterality Date   BIOPSY  10/21/2020   Procedure: BIOPSY;  Surgeon: Jerene Bears, MD;  Location: [facility] ENDOSCOPY;  Service: Gastroenterology;;   ESOPHAGOGASTRODUODENOSCOPY (EGD) WITH PROPOFOL N/A 10/21/2020   Procedure: ESOPHAGOGASTRODUODENOSCOPY (EGD) WITH PROPOFOL;  Surgeon: Jerene Bears, MD;  Location: [facility] ENDOSCOPY;  Service: Gastroenterology;  Laterality: N/A;   [place]   in [place]; w/gastric pull thru   [place]  10/21/2020   Procedure: HEMOSTASIS CLIP PLACEMENT;  Surgeon: Jerene Bears, MD;  Location: [facility] ENDOSCOPY;  Service: Gastroenterology;;    Family  History  Problem Relation Age of Onset   Cervical cancer Mother    Alzheimer's disease Father    Stomach cancer Neg Hx    Esophageal cancer Neg Hx    Pancreatic cancer Neg Hx     No Known Allergies  Current Outpatient Medications on File Prior to Visit  Medication Sig Dispense Refill   Ascorbic Acid (VITAMIN C) 1000 MG tablet Take 1,000 mg by mouth daily.     B Complex-C (SUPER B COMPLEX PO) Take 1 tablet by mouth daily.     Camphor-Menthol-Methyl Sal (TIGER BALM MUSCLE RUB EX) Apply 1 application. topically as needed (back pain).     Cholecalciferol (VITAMIN D3) 50 MCG (2000 UT) capsule Take 2,000 Units by mouth  daily.     diclofenac (VOLTAREN) 75 MG EC tablet Take 75 mg by mouth 2 (two) times daily.     gabapentin (NEURONTIN) 100 MG capsule 1 tab po prior to sleep. 30 capsule 0   hydrALAZINE (APRESOLINE) 25 MG tablet Take 1 tablet (25 mg total) by mouth 3 (three) times daily. 90 tablet 11   lidocaine (LIDODERM) 5 % Place 1 patch onto the skin daily as needed (back pain). Remove & Discard patch within 12 hours or as directed by MD     lisinopril (ZESTRIL) 40 MG tablet Take 1 tablet (40 mg total) by mouth daily. 90 tablet 3   metoprolol succinate (TOPROL-XL) 50 MG 24 hr tablet Take 1 tablet (50 mg total) by mouth daily. TAKE WITH OR IMMEDIATELY FOLLOWING A MEAL. 30 tablet 11   metoprolol succinate (TOPROL-XL) 50 MG 24 hr tablet Take 1 tablet (50 mg total) by mouth daily. Take with or immediately following a meal. 90 tablet 3   Misc Natural Products (OSTEO BI-FLEX ADV TRIPLE ST PO) Take 1 tablet by mouth daily.     OVER THE COUNTER MEDICATION Take 1 tablet by mouth daily. GNC MEGA MEN ENERGY AND METABOBLISM     pantoprazole (PROTONIX) 40 MG tablet Take 1 tablet (40 mg total) by mouth daily. 90 tablet 3   polyvinyl alcohol (LIQUIFILM TEARS) 1.4 % ophthalmic solution Place 1 drop into both eyes as needed for dry eyes.     potassium chloride (KLOR-CON) 10 MEQ tablet Take 1 tablet (10 mEq total) by mouth daily. 30 tablet 1   Turmeric Curcumin 500 MG CAPS Take 1 capsule by mouth daily.     vitamin E 180 MG (400 UNITS) capsule Take 400 Units by mouth daily.     No current facility-administered medications on file prior to visit.    BP 137/80   Pulse 70   Resp 18   Ht '5\' 9"'$  (1.753 m)   Wt 128 lb 3.2 oz (58.2 kg)   SpO2 100%   BMI 18.93 kg/m           Objective:   Physical Exam  General Mental Status- Alert. General Appearance- Not in acute distress.   Skin General: Color- Normal Color. Moisture- Normal Moisture.   Chest and Lung Exam Auscultation: Breath  Sounds:-Normal.  Cardiovascular Auscultation:Rythm- Regular. Murmurs & Other Heart Sounds:Auscultation of the heart reveals- No Murmurs.   Neurologic Cranial Nerve exam:- CN III-XII intact(No nystagmus), symmetric smile. Strength:- 5/5 equal and symmetric strength both upper and lower extremities.       Assessment & Plan:   Patient Instructions  Chronic neck pain. You signed control med contract and update contract today. Sent your norco rx to [facility].  Htn- bp controlled toady  with current regimen lisinopri, toprol and hydralazine.   Send me my chart update when you need refills of your pain med.  Follow up in 6 month or sooner if needed.

## 2022-03-27 NOTE — Patient Instructions (Addendum)
Chronic neck pain. You signed control med contract and update contract today. Sent your norco rx to [facility].  Htn- bp controlled toady with current regimen lisinopri, toprol and hydralazine.   Send me my chart update when you need refills of your pain med.  Follow up in 6 month or sooner if needed.

## 2022-03-29 LAB — DRUG MONITORING PANEL 376104, URINE
Amphetamines: NEGATIVE ng/mL (ref <500)
Barbiturates: NEGATIVE ng/mL (ref <300)
Benzodiazepines: NEGATIVE ng/mL (ref <100)
Cocaine Metabolite: NEGATIVE ng/mL (ref <150)
Codeine: NEGATIVE ng/mL (ref <50)
Desmethyltramadol: NEGATIVE ng/mL (ref <100)
Hydrocodone: 2935 ng/mL — ABNORMAL HIGH (ref <50)
Hydromorphone: 291 ng/mL — ABNORMAL HIGH (ref <50)
Morphine: NEGATIVE ng/mL (ref <50)
Norhydrocodone: 3606 ng/mL — ABNORMAL HIGH (ref <50)
Opiates: POSITIVE ng/mL — AB (ref <100)
Oxycodone: NEGATIVE ng/mL (ref <100)
Tramadol: NEGATIVE ng/mL (ref <100)

## 2022-03-29 LAB — DM TEMPLATE

## 2022-04-05 ENCOUNTER — Other Ambulatory Visit (HOSPITAL_BASED_OUTPATIENT_CLINIC_OR_DEPARTMENT_OTHER): Payer: Self-pay

## 2022-04-05 MED ORDER — GABAPENTIN 100 MG PO CAPS
100.0000 mg | ORAL_CAPSULE | Freq: Every evening | ORAL | 1 refills | Status: DC
  Filled 2022-04-05: qty 90, 90d supply, fill #0
  Filled 2022-08-22: qty 90, 90d supply, fill #1

## 2022-04-18 ENCOUNTER — Telehealth: Payer: Self-pay | Admitting: Medical

## 2022-04-18 MED ORDER — LISINOPRIL 40 MG PO TABS: 40.0000 mg | ORAL_TABLET | Freq: Every day | ORAL | 3 refills | Status: DC

## 2022-04-18 NOTE — Telephone Encounter (Signed)
Rx sent 

## 2022-04-18 NOTE — Telephone Encounter (Signed)
Prescription Request  04/18/2022  Is this a "Controlled Substance" medicine? No  LOV: 03/27/2022  What is the name of the medication or equipment?  lisinopril (ZESTRIL) 40 MG tablet  *patient has 1 pill left*   Have you contacted your pharmacy to request a refill? No - changing pharmacy from last refill  Which pharmacy would you like this sent to?   [facility] [address], [place] [address] Phone: [phone] Fax: [phone]    Patient notified that their request is being sent to the clinical staff for review and that they should receive a response within 2 business days.   Please advise at Mobile [phone] (mobile)

## 2022-05-09 ENCOUNTER — Other Ambulatory Visit: Payer: Self-pay | Admitting: Medical

## 2022-05-09 NOTE — Telephone Encounter (Signed)
Prescription Request  05/09/2022  Is this a "Controlled Substance" medicine? Yes  LOV: 03/27/2022  What is the name of the medication or equipment?   Rx #: [id number]  HYDROcodone-acetaminophen (NORCO) 5-325 MG tablet [id number]   Have you contacted your pharmacy to request a refill? No   Which pharmacy would you like this sent to?   [facility] [address], [place] [address] Phone: [phone] Fax: [phone]    Patient notified that their request is being sent to the clinical staff for review and that they should receive a response within 2 business days.   Please advise at Mobile [phone] (mobile)

## 2022-05-09 NOTE — Telephone Encounter (Signed)
Requestin/g: [place]- 03/27/22 UDS:1/8//24 Last Visit:03/27/22 Next Visit:09/25/22 Last Refill:03/27/22  Please Advise

## 2022-05-11 ENCOUNTER — Other Ambulatory Visit (HOSPITAL_BASED_OUTPATIENT_CLINIC_OR_DEPARTMENT_OTHER): Payer: Self-pay

## 2022-05-11 MED ORDER — HYDROCODONE-ACETAMINOPHEN 5-325 MG PO TABS
1.0000 | ORAL_TABLET | Freq: Four times a day (QID) | ORAL | 0 refills | Status: DC | PRN
  Filled 2022-05-11: qty 30, 7d supply, fill #0

## 2022-05-11 NOTE — Telephone Encounter (Signed)
Rx refill sent to pt pharmacy 

## 2022-05-12 ENCOUNTER — Other Ambulatory Visit: Payer: Self-pay

## 2022-05-15 ENCOUNTER — Other Ambulatory Visit (HOSPITAL_BASED_OUTPATIENT_CLINIC_OR_DEPARTMENT_OTHER): Payer: Self-pay

## 2022-06-09 ENCOUNTER — Other Ambulatory Visit: Payer: Self-pay | Admitting: Medical

## 2022-06-09 NOTE — Telephone Encounter (Signed)
Prescription Request  06/09/2022  Is this a "Controlled Substance" medicine? Yes  LOV: 03/27/2022  What is the name of the medication or equipment?   Rx #: [id number]  HYDROcodone-acetaminophen (NORCO) 5-325 MG tablet [id number]   Have you contacted your pharmacy to request a refill? No   Which pharmacy would you like this sent to?  [facility] [address], [place] [address] Phone: [phone] Fax: [phone]    Patient notified that their request is being sent to the clinical staff for review and that they should receive a response within 2 business days.   Please advise at Mobile [phone] (mobile)

## 2022-06-09 NOTE — Telephone Encounter (Signed)
Requesting: NORCO Contract: 04/11/22 UDS: 03/27/22 Last Visit: 03/27/22 Next Visit:09/25/22 Last Refill:05/11/22  Please Advise

## 2022-06-11 ENCOUNTER — Other Ambulatory Visit (HOSPITAL_BASED_OUTPATIENT_CLINIC_OR_DEPARTMENT_OTHER): Payer: Self-pay

## 2022-06-11 MED ORDER — HYDROCODONE-ACETAMINOPHEN 5-325 MG PO TABS
1.0000 | ORAL_TABLET | Freq: Four times a day (QID) | ORAL | 0 refills | Status: DC | PRN
  Filled 2022-06-11: qty 30, 8d supply, fill #0

## 2022-06-11 NOTE — Telephone Encounter (Signed)
Rx refill sent to pharmacy. 

## 2022-06-12 ENCOUNTER — Other Ambulatory Visit (HOSPITAL_BASED_OUTPATIENT_CLINIC_OR_DEPARTMENT_OTHER): Payer: Self-pay

## 2022-06-20 ENCOUNTER — Encounter (HOSPITAL_BASED_OUTPATIENT_CLINIC_OR_DEPARTMENT_OTHER): Payer: Self-pay

## 2022-06-20 ENCOUNTER — Emergency Department (HOSPITAL_BASED_OUTPATIENT_CLINIC_OR_DEPARTMENT_OTHER): Payer: Medicaid Other

## 2022-06-20 DIAGNOSIS — E871 Hypo-osmolality and hyponatremia: Secondary | ICD-10-CM | POA: Diagnosis not present

## 2022-06-20 DIAGNOSIS — E860 Dehydration: Secondary | ICD-10-CM | POA: Insufficient documentation

## 2022-06-20 DIAGNOSIS — W1830XA Fall on same level, unspecified, initial encounter: Secondary | ICD-10-CM | POA: Diagnosis not present

## 2022-06-20 DIAGNOSIS — I10 Essential (primary) hypertension: Secondary | ICD-10-CM | POA: Diagnosis not present

## 2022-06-20 DIAGNOSIS — R197 Diarrhea, unspecified: Secondary | ICD-10-CM | POA: Diagnosis not present

## 2022-06-20 DIAGNOSIS — R1084 Generalized abdominal pain: Secondary | ICD-10-CM | POA: Insufficient documentation

## 2022-06-20 DIAGNOSIS — Z87891 Personal history of nicotine dependence: Secondary | ICD-10-CM | POA: Insufficient documentation

## 2022-06-20 DIAGNOSIS — S2241XA Multiple fractures of ribs, right side, initial encounter for closed fracture: Secondary | ICD-10-CM | POA: Diagnosis not present

## 2022-06-20 DIAGNOSIS — E876 Hypokalemia: Secondary | ICD-10-CM | POA: Insufficient documentation

## 2022-06-20 DIAGNOSIS — Z79899 Other long term (current) drug therapy: Secondary | ICD-10-CM | POA: Diagnosis not present

## 2022-06-20 DIAGNOSIS — R0781 Pleurodynia: Secondary | ICD-10-CM | POA: Diagnosis present

## 2022-06-20 LAB — COMPREHENSIVE METABOLIC PANEL
ALT: 28 U/L (ref 0–44)
AST: 40 U/L (ref 15–41)
Albumin: 3.4 g/dL — ABNORMAL LOW (ref 3.5–5.0)
Alkaline Phosphatase: 72 U/L (ref 38–126)
Anion gap: 11 (ref 5–15)
BUN: 17 mg/dL (ref 8–23)
CO2: 25 mmol/L (ref 22–32)
Calcium: 8.2 mg/dL — ABNORMAL LOW (ref 8.9–10.3)
Chloride: 87 mmol/L — ABNORMAL LOW (ref 98–111)
Creatinine, Ser: 1.14 mg/dL (ref 0.61–1.24)
GFR, Estimated: >60 mL/min (ref >60)
Glucose, Bld: 124 mg/dL — ABNORMAL HIGH (ref 70–99)
Potassium: 2.9 mmol/L — ABNORMAL LOW (ref 3.5–5.1)
Sodium: 123 mmol/L — ABNORMAL LOW (ref 135–145)
Total Bilirubin: 1.6 mg/dL — ABNORMAL HIGH (ref 0.3–1.2)
Total Protein: 7.3 g/dL (ref 6.5–8.1)

## 2022-06-20 LAB — CBC WITH DIFFERENTIAL/PLATELET
Abs Immature Granulocytes: 0.14 10*3/uL — ABNORMAL HIGH (ref 0.00–0.07)
Basophils Absolute: 0 10*3/uL (ref 0.0–0.1)
Basophils Relative: 0 %
Eosinophils Absolute: 0 10*3/uL (ref 0.0–0.5)
Eosinophils Relative: 0 %
HCT: 35.9 % — ABNORMAL LOW (ref 39.0–52.0)
Hemoglobin: 12.2 g/dL — ABNORMAL LOW (ref 13.0–17.0)
Immature Granulocytes: 1 %
Lymphocytes Relative: 5 %
Lymphs Abs: 0.8 10*3/uL (ref 0.7–4.0)
MCH: 27 pg (ref 26.0–34.0)
MCHC: 34 g/dL (ref 30.0–36.0)
MCV: 79.4 fL — ABNORMAL LOW (ref 80.0–100.0)
Monocytes Absolute: 1.7 10*3/uL — ABNORMAL HIGH (ref 0.1–1.0)
Monocytes Relative: 11 %
Neutro Abs: 12.4 10*3/uL — ABNORMAL HIGH (ref 1.7–7.7)
Neutrophils Relative %: 83 %
Platelets: 198 10*3/uL (ref 150–400)
RBC: 4.52 MIL/uL (ref 4.22–5.81)
RDW: 16.4 % — ABNORMAL HIGH (ref 11.5–15.5)
WBC: 15.1 10*3/uL — ABNORMAL HIGH (ref 4.0–10.5)
nRBC: 0 % (ref 0.0–0.2)

## 2022-06-20 LAB — CBG MONITORING, ED: Glucose-Capillary: 134 mg/dL — ABNORMAL HIGH (ref 70–99)

## 2022-06-20 NOTE — ED Triage Notes (Signed)
Pt c/o cramping pain onset Saturday night. Associated dehydration, nausea, diarrhea. Weakness, "feeling like passing out" all weekend. Pt states that today he fell x2, hit R rib cage. States that he has not taken BP meds x3 days "because my BP was low Saturday."

## 2022-06-21 ENCOUNTER — Other Ambulatory Visit (HOSPITAL_BASED_OUTPATIENT_CLINIC_OR_DEPARTMENT_OTHER): Payer: Self-pay

## 2022-06-21 ENCOUNTER — Emergency Department (HOSPITAL_BASED_OUTPATIENT_CLINIC_OR_DEPARTMENT_OTHER)
Admission: EM | Admit: 2022-06-21 | Discharge: 2022-06-21 | Disposition: A | Discharge status: Home / Self Care | Payer: Medicaid Other | Attending: Emergency Medicine | Admitting: Emergency Medicine

## 2022-06-21 DIAGNOSIS — E871 Hypo-osmolality and hyponatremia: Secondary | ICD-10-CM

## 2022-06-21 DIAGNOSIS — R197 Diarrhea, unspecified: Secondary | ICD-10-CM

## 2022-06-21 DIAGNOSIS — R1084 Generalized abdominal pain: Secondary | ICD-10-CM

## 2022-06-21 DIAGNOSIS — E876 Hypokalemia: Secondary | ICD-10-CM

## 2022-06-21 DIAGNOSIS — E860 Dehydration: Secondary | ICD-10-CM

## 2022-06-21 DIAGNOSIS — S2241XA Multiple fractures of ribs, right side, initial encounter for closed fracture: Secondary | ICD-10-CM

## 2022-06-21 LAB — BASIC METABOLIC PANEL
Anion gap: 12 (ref 5–15)
BUN: 14 mg/dL (ref 8–23)
CO2: 21 mmol/L — ABNORMAL LOW (ref 22–32)
Calcium: 7.8 mg/dL — ABNORMAL LOW (ref 8.9–10.3)
Chloride: 95 mmol/L — ABNORMAL LOW (ref 98–111)
Creatinine, Ser: 0.84 mg/dL (ref 0.61–1.24)
GFR, Estimated: >60 mL/min (ref >60)
Glucose, Bld: 98 mg/dL (ref 70–99)
Potassium: 2.8 mmol/L — ABNORMAL LOW (ref 3.5–5.1)
Sodium: 128 mmol/L — ABNORMAL LOW (ref 135–145)

## 2022-06-21 LAB — URINALYSIS, ROUTINE W REFLEX MICROSCOPIC
Bilirubin Urine: NEGATIVE
Glucose, UA: NEGATIVE mg/dL
Ketones, ur: NEGATIVE mg/dL
Leukocytes,Ua: NEGATIVE
Nitrite: NEGATIVE
Protein, ur: 30 mg/dL — AB
Specific Gravity, Urine: 1.01 (ref 1.005–1.030)
pH: 6.5 (ref 5.0–8.0)

## 2022-06-21 LAB — URINALYSIS, MICROSCOPIC (REFLEX): Squamous Epithelial / HPF: NONE SEEN /HPF (ref 0–5)

## 2022-06-21 MED ORDER — FENTANYL CITRATE PF 50 MCG/ML IJ SOSY
50.0000 ug | PREFILLED_SYRINGE | Freq: Once | INTRAMUSCULAR | Status: AC | PRN
  Administered 2022-06-21: 50 ug via INTRAVENOUS
  Filled 2022-06-21: qty 1

## 2022-06-21 MED ORDER — ONDANSETRON HCL 4 MG/2ML IJ SOLN
4.0000 mg | Freq: Once | INTRAMUSCULAR | Status: AC
  Administered 2022-06-21: 4 mg via INTRAVENOUS
  Filled 2022-06-21: qty 2

## 2022-06-21 MED ORDER — ONDANSETRON 4 MG PO TBDP
4.0000 mg | ORAL_TABLET | Freq: Three times a day (TID) | ORAL | 0 refills | Status: DC | PRN
  Filled 2022-06-21: qty 20, 7d supply, fill #0

## 2022-06-21 MED ORDER — LACTATED RINGERS IV BOLUS
2000.0000 mL | Freq: Once | INTRAVENOUS | Status: AC
  Administered 2022-06-21: 2000 mL via INTRAVENOUS

## 2022-06-21 MED ORDER — POTASSIUM CHLORIDE CRYS ER 20 MEQ PO TBCR
40.0000 meq | EXTENDED_RELEASE_TABLET | Freq: Once | ORAL | Status: AC
  Administered 2022-06-21: 40 meq via ORAL
  Filled 2022-06-21: qty 2

## 2022-06-21 MED ORDER — SENNOSIDES-DOCUSATE SODIUM 8.6-50 MG PO TABS
1.0000 | ORAL_TABLET | Freq: Every evening | ORAL | 0 refills | Status: DC | PRN
  Filled 2022-06-21: qty 60, 60d supply, fill #0

## 2022-06-21 MED ORDER — HYDROCODONE-ACETAMINOPHEN 5-325 MG PO TABS
1.0000 | ORAL_TABLET | Freq: Four times a day (QID) | ORAL | 0 refills | Status: DC | PRN
  Filled 2022-06-21: qty 12, 3d supply, fill #0

## 2022-06-21 MED ORDER — POTASSIUM CHLORIDE CRYS ER 20 MEQ PO TBCR
40.0000 meq | EXTENDED_RELEASE_TABLET | Freq: Every day | ORAL | 0 refills | Status: DC
  Filled 2022-06-21: qty 10, 5d supply, fill #0

## 2022-06-21 MED ORDER — FENTANYL CITRATE PF 50 MCG/ML IJ SOSY
50.0000 ug | PREFILLED_SYRINGE | Freq: Once | INTRAMUSCULAR | Status: AC
  Administered 2022-06-21: 50 ug via INTRAVENOUS
  Filled 2022-06-21: qty 1

## 2022-06-21 NOTE — ED Provider Notes (Signed)
Blood pressure (!) 152/94, pulse 92, temperature 98.1 F (36.7 C), temperature source Oral, resp. rate 16, SpO2 92 %.  Assuming care from Dr. Florina Ou.  In short, Nathaniel Williams is a 62 y.o. male with a chief complaint of Abdominal Pain .  Refer to the original H&P for additional details.  The current plan of care is to follow repeat chemistry.  Repeat chemistry improved. Patient feeling well. Tolerating PO. Stable for discharge.    Margette Fast, MD 06/22/22 [phone]

## 2022-06-21 NOTE — ED Notes (Signed)
Long MD notified of K+ of 2.8. Verbal to still discharge patient at this time. Marva charge RN made aware as well.

## 2022-06-21 NOTE — Discharge Instructions (Signed)
You were seen in the emergency room today after a fall.  You have 2 rib fractures on the right and treating you with pain medicine and incentive spirometer.  You may also take Tylenol but realize that hydrocodone has 325 mg of Tylenol per dose.  You cannot take more than 4000 mg in a 24-hour period.   Your lab work showed dehydration and low sodium along with low potassium.  Your numbers are moving in the right direction on repeat labs and you are feeling much better.  I would, however, like for you to call your primary care doctor today and arrange for lab work in the next 1 to 2 days to make sure these continue to normalize.   If you feel worse, you should return to the emergency department immediately for reevaluation.

## 2022-06-21 NOTE — ED Provider Notes (Signed)
[facility] Provider Note: Georgena Spurling, MD, FACEP  CSN: [id number] MRN: [id number] ARRIVAL: 06/20/22 at 2134 ROOM: [place]  Abdominal Pain   HISTORY OF PRESENT ILLNESS  06/21/22 4:17 AM Nathaniel Williams is a 62 y.o. male who developed lower abdominal cramping 4 days ago.  Three days ago he developed nausea and lightheadedness but without vomiting.  He did have some diarrhea which was not profuse.  His nausea and lightheadedness have persisted.  Yesterday morning he fell twice due to lightheadedness.  On 1 occasion he fell over the bathtub striking his right chest.  He is now having pain in his right ribs which she rates as a 10 out of 10, worse with breathing or movement.  He has had no food for the past 4 days but has had some liquids to drink.  He denies any abdominal pain at the present time.   Past Medical History:  Diagnosis Date   Achalasia 1996   Dx in [place] s/p Heller Myotomy w/gastric pull-thru   Alcohol use    Aspiration pneumonia    Cervical spondylosis    Gastric ulcer    Gastritis    GI bleed    H/O hydrocele    of scrotum   Hypertension    IDA (iron deficiency anemia)     Past Surgical History:  Procedure Laterality Date   BIOPSY  10/21/2020   Procedure: BIOPSY;  Surgeon: Jerene Bears, MD;  Location: [facility] ENDOSCOPY;  Service: Gastroenterology;;   ESOPHAGOGASTRODUODENOSCOPY (EGD) WITH PROPOFOL N/A 10/21/2020   Procedure: ESOPHAGOGASTRODUODENOSCOPY (EGD) WITH PROPOFOL;  Surgeon: Jerene Bears, MD;  Location: [place];  Service: Gastroenterology;  Laterality: N/A;   [place]   in [place]; w/gastric pull thru   [place]  10/21/2020   Procedure: HEMOSTASIS CLIP PLACEMENT;  Surgeon: Jerene Bears, MD;  Location: [facility] ENDOSCOPY;  Service: Gastroenterology;;    Family History  Problem Relation Age of Onset   Cervical cancer Mother    Alzheimer's disease Father    Stomach cancer Neg Hx     Esophageal cancer Neg Hx    Pancreatic cancer Neg Hx     Social History   Tobacco Use   Smoking status: Former    Packs/day: 0.25    Years: 18.00    Additional pack years: 0.00    Total pack years: 4.50    Types: Cigarettes   Smokeless tobacco: Never  Vaping Use   Vaping Use: Never used  Substance Use Topics   Alcohol use: Not Currently    Comment: 1.5 L rum every 10 days (per hospital record 10/21/20)   Drug use: Never    Prior to Admission medications   Medication Sig Start Date End Date Taking? Authorizing Provider  HYDROcodone-acetaminophen (NORCO/VICODIN) 5-325 MG tablet Take 1 tablet by mouth every 6 (six) hours as needed for severe pain. 06/21/22  Yes Long, Wonda Olds, MD  ondansetron (ZOFRAN-ODT) 4 MG disintegrating tablet Take 1 tablet (4 mg total) by mouth every 8 (eight) hours as needed. 06/21/22  Yes Long, Wonda Olds, MD  potassium chloride SA (KLOR-CON M) 20 MEQ tablet Take 2 tablets (40 mEq total) by mouth daily for 5 days. 06/21/22 06/26/22 Yes Long, Wonda Olds, MD  senna-docusate (SENOKOT-S) 8.6-50 MG tablet Take 1 tablet by mouth at bedtime as needed for mild constipation or moderate constipation. 06/21/22  Yes Long, Wonda Olds, MD  Ascorbic Acid (VITAMIN C) 1000 MG tablet Take 1,000  mg by mouth daily.    [provider]  B Complex-C (SUPER B COMPLEX PO) Take 1 tablet by mouth daily.    [provider]  Camphor-Menthol-Methyl Sal (TIGER BALM MUSCLE RUB EX) Apply 1 application. topically as needed (back pain).    [provider]  Cholecalciferol (VITAMIN D3) 50 MCG (2000 UT) capsule Take 2,000 Units by mouth daily.    [provider]  diclofenac (VOLTAREN) 75 MG EC tablet Take 75 mg by mouth 2 (two) times daily. 09/14/20   [provider]  gabapentin (NEURONTIN) 100 MG capsule 1 tab po prior to sleep. 07/11/21   Saguier, Percell Miller, PA-C  gabapentin (NEURONTIN) 100 MG capsule Take 1 capsule (100 mg total) by mouth every evening. 04/05/22      hydrALAZINE (APRESOLINE) 25 MG tablet Take 1 tablet (25 mg total) by mouth 3 (three) times daily. 02/17/22   Saguier, Percell Miller, PA-C  lidocaine (LIDODERM) 5 % Place 1 patch onto the skin daily as needed (back pain). Remove & Discard patch within 12 hours or as directed by MD    [provider]  lisinopril (ZESTRIL) 40 MG tablet Take 1 tablet (40 mg total) by mouth daily. 04/18/22   Saguier, Percell Miller, PA-C  metoprolol succinate (TOPROL-XL) 50 MG 24 hr tablet Take 1 tablet (50 mg total) by mouth daily. TAKE WITH OR IMMEDIATELY FOLLOWING A MEAL. 12/10/20 01/09/21  Saguier, Percell Miller, PA-C  metoprolol succinate (TOPROL-XL) 50 MG 24 hr tablet Take 1 tablet (50 mg total) by mouth daily. Take with or immediately following a meal. 01/11/22   Saguier, Percell Miller, PA-C  Misc Natural Products (OSTEO BI-FLEX ADV TRIPLE ST PO) Take 1 tablet by mouth daily.    [provider]  OVER THE COUNTER MEDICATION Take 1 tablet by mouth daily. [place] MEGA MEN ENERGY AND METABOBLISM    [provider]  pantoprazole (PROTONIX) 40 MG tablet Take 1 tablet (40 mg total) by mouth daily. 04/21/21   Pyrtle, Lajuan Lines, MD  polyvinyl alcohol (LIQUIFILM TEARS) 1.4 % ophthalmic solution Place 1 drop into both eyes as needed for dry eyes.    [provider]  Turmeric Curcumin 500 MG CAPS Take 1 capsule by mouth daily.    [provider]  vitamin E 180 MG (400 UNITS) capsule Take 400 Units by mouth daily.    [provider]    Allergies Patient has no known allergies.   REVIEW OF SYSTEMS  Negative except as noted here or in the History of Present Illness.   PHYSICAL EXAMINATION  Initial Vital Signs Blood pressure (!) 143/87, pulse 94, temperature 98.3 F (36.8 C), temperature source Oral, resp. rate 18, SpO2 99 %.  Examination General: Well-developed, cachectic male in no acute distress; appearance consistent with age of record HENT: normocephalic; atraumatic Eyes: Normal appearance Neck:  supple Heart: regular rate and rhythm Lungs: clear to auscultation bilaterally Chest: Right lateral rib tenderness Abdomen: soft; nondistended; nontender; bowel sounds present Extremities: No deformity; full range of motion; pulses normal Neurologic: Awake, alert and oriented; motor function intact in all extremities and symmetric; no facial droop Skin: Warm and dry Psychiatric: Flat affect   RESULTS  Summary of this visit's results, reviewed and interpreted by myself:   EKG Interpretation  Date/Time:  Tuesday June 20 2022 21:43:42 EDT Ventricular Rate:  99 PR Interval:  184 QRS Duration: 90 QT Interval:  354 QTC Calculation: 455 R Axis:   85 Text Interpretation: Sinus rhythm Ventricular premature complex Consider right atrial enlargement  LVH with secondary repolarization abnormality Nonspecific T wave abnormality Confirmed by Edder Bellanca, Jenny Reichmann [phone]) on 06/21/2022 12:01:58 AM       Laboratory Studies: Results for orders placed or performed during the hospital encounter of 06/21/22 (from the past 24 hour(s))  CBG monitoring, ED     Status: Abnormal   Collection Time: 06/20/22  9:39 PM  Result Value Ref Range   Glucose-Capillary 134 (H) 70 - 99 mg/dL   Comment 1 Notify RN   Comprehensive metabolic panel     Status: Abnormal   Collection Time: 06/20/22  9:41 PM  Result Value Ref Range   Sodium 123 (L) 135 - 145 mmol/L   Potassium 2.9 (L) 3.5 - 5.1 mmol/L   Chloride 87 (L) 98 - 111 mmol/L   CO2 25 22 - 32 mmol/L   Glucose, Bld 124 (H) 70 - 99 mg/dL   BUN 17 8 - 23 mg/dL   Creatinine, Ser 1.14 0.61 - 1.24 mg/dL   Calcium 8.2 (L) 8.9 - 10.3 mg/dL   Total Protein 7.3 6.5 - 8.1 g/dL   Albumin 3.4 (L) 3.5 - 5.0 g/dL   AST 40 15 - 41 U/L   ALT 28 0 - 44 U/L   Alkaline Phosphatase 72 38 - 126 U/L   Total Bilirubin 1.6 (H) 0.3 - 1.2 mg/dL   GFR, Estimated >60 >60 mL/min   Anion gap 11 5 - 15  CBC with Differential     Status: Abnormal   Collection Time: 06/20/22  9:41 PM   Result Value Ref Range   WBC 15.1 (H) 4.0 - 10.5 K/uL   RBC 4.52 4.22 - 5.81 MIL/uL   Hemoglobin 12.2 (L) 13.0 - 17.0 g/dL   HCT 35.9 (L) 39.0 - 52.0 %   MCV 79.4 (L) 80.0 - 100.0 fL   MCH 27.0 26.0 - 34.0 pg   MCHC 34.0 30.0 - 36.0 g/dL   RDW 16.4 (H) 11.5 - 15.5 %   Platelets 198 150 - 400 K/uL   nRBC 0.0 0.0 - 0.2 %   Neutrophils Relative % 83 %   Neutro Abs 12.4 (H) 1.7 - 7.7 K/uL   Lymphocytes Relative 5 %   Lymphs Abs 0.8 0.7 - 4.0 K/uL   Monocytes Relative 11 %   Monocytes Absolute 1.7 (H) 0.1 - 1.0 K/uL   Eosinophils Relative 0 %   Eosinophils Absolute 0.0 0.0 - 0.5 K/uL   Basophils Relative 0 %   Basophils Absolute 0.0 0.0 - 0.1 K/uL   Immature Granulocytes 1 %   Abs Immature Granulocytes 0.14 (H) 0.00 - 0.07 K/uL  Urinalysis, Routine w reflex microscopic -Urine, Clean Catch     Status: Abnormal   Collection Time: 06/21/22  5:20 AM  Result Value Ref Range   Color, Urine YELLOW YELLOW   APPearance CLEAR CLEAR   Specific Gravity, Urine 1.010 1.005 - 1.030   pH 6.5 5.0 - 8.0   Glucose, UA NEGATIVE NEGATIVE mg/dL   Hgb urine dipstick SMALL (A) NEGATIVE   Bilirubin Urine NEGATIVE NEGATIVE   Ketones, ur NEGATIVE NEGATIVE mg/dL   Protein, ur 30 (A) NEGATIVE mg/dL   Nitrite NEGATIVE NEGATIVE   Leukocytes,Ua NEGATIVE NEGATIVE  Urinalysis, Microscopic (reflex)     Status: Abnormal   Collection Time: 06/21/22  5:20 AM  Result Value Ref Range   RBC / HPF 0-5 0 - 5 RBC/hpf   WBC, UA 0-5 0 - 5 WBC/hpf   Bacteria, UA RARE (A) NONE SEEN  Squamous Epithelial / HPF NONE SEEN 0 - 5 /HPF  Basic metabolic panel     Status: Abnormal   Collection Time: 06/21/22  6:38 AM  Result Value Ref Range   Sodium 128 (L) 135 - 145 mmol/L   Potassium 2.8 (L) 3.5 - 5.1 mmol/L   Chloride 95 (L) 98 - 111 mmol/L   CO2 21 (L) 22 - 32 mmol/L   Glucose, Bld 98 70 - 99 mg/dL   BUN 14 8 - 23 mg/dL   Creatinine, Ser 0.84 0.61 - 1.24 mg/dL   Calcium 7.8 (L) 8.9 - 10.3 mg/dL   GFR, Estimated >60  >60 mL/min   Anion gap 12 5 - 15   Imaging Studies: DG Ribs Unilateral W/Chest Right  Result Date: 06/20/2022 CLINICAL DATA:  Fall EXAM: RIGHT RIBS AND CHEST - 3+ VIEW COMPARISON:  03/07/2018 FINDINGS: Single view chest demonstrates no acute airspace disease. Normal cardiac size. Clips over the lower mediastinum. Air-filled structure at the right lower mediastinal silhouette likely related to history of esophagectomy and gastric pull-through. Right rib series demonstrates an acute minimally displaced right sixth and seventh lateral rib fractures. IMPRESSION: Acute minimally displaced right sixth and seventh lateral rib fractures. No pneumothorax or pleural effusion. Electronically Signed   By: Donavan Foil M.D.   On: 06/20/2022 22:23    ED COURSE and MDM  Nursing notes, initial and subsequent vitals signs, including pulse oximetry, reviewed and interpreted by myself.  Vitals:   06/21/22 0155 06/21/22 0156 06/21/22 0557 06/21/22 0710  BP:  (!) 143/87 (!) 152/94 (!) 141/91  Pulse:  94 92 (!) 102  Resp:  18 16 17   Temp: 98.3 F (36.8 C) 98.3 F (36.8 C) 98.1 F (36.7 C)   TempSrc:  Oral Oral   SpO2:  99% 92% 97%   Medications  potassium chloride SA (KLOR-CON M) CR tablet 40 mEq (40 mEq Oral Given 06/21/22 0446)  potassium chloride SA (KLOR-CON M) CR tablet 40 mEq (40 mEq Oral Given 06/21/22 0537)  lactated ringers bolus 2,000 mL (0 mLs Intravenous Stopped 06/21/22 0635)  ondansetron (ZOFRAN) injection 4 mg (4 mg Intravenous Given 06/21/22 0445)  fentaNYL (SUBLIMAZE) injection 50 mcg (50 mcg Intravenous Given 06/21/22 0446)  fentaNYL (SUBLIMAZE) injection 50 mcg (50 mcg Intravenous Given 06/21/22 0506)   7:00 AM Signed out to Dr. Laverta Baltimore. Two doses of K-Dur given as well as two liters of LR. Plan is to recheck BMET and vitals to see if patient's hyponatremia, hypokalemia and lightheadedness have improved. If so, he may be stable for discharge home. He will likely need pain management for his rib  fractures.   PROCEDURES  Procedures   ED DIAGNOSES     ICD-10-CM   1. Generalized abdominal pain  R10.84     2. Diarrhea of presumed infectious origin  R19.7     3. Closed fracture of multiple ribs of right side, initial encounter  S22.41XA     4. Hyponatremia  E87.1     5. Hypokalemia  E87.6     6. Dehydration  E86.0          [place] Mcphee, Jenny Reichmann, MD 06/21/22 1035

## 2022-06-22 ENCOUNTER — Ambulatory Visit (INDEPENDENT_AMBULATORY_CARE_PROVIDER_SITE_OTHER): Payer: Medicaid Other | Admitting: Medical

## 2022-06-22 VITALS — BP 124/76 | HR 84 | Temp 98.2°F | Resp 18 | Ht 69.0 in | Wt 129.6 lb

## 2022-06-22 DIAGNOSIS — E876 Hypokalemia: Secondary | ICD-10-CM | POA: Diagnosis not present

## 2022-06-22 DIAGNOSIS — S2241XD Multiple fractures of ribs, right side, subsequent encounter for fracture with routine healing: Secondary | ICD-10-CM | POA: Diagnosis not present

## 2022-06-22 DIAGNOSIS — E871 Hypo-osmolality and hyponatremia: Secondary | ICD-10-CM | POA: Diagnosis not present

## 2022-06-22 DIAGNOSIS — S2241XS Multiple fractures of ribs, right side, sequela: Secondary | ICD-10-CM

## 2022-06-22 DIAGNOSIS — D72829 Elevated white blood cell count, unspecified: Secondary | ICD-10-CM | POA: Diagnosis not present

## 2022-06-22 LAB — COMPREHENSIVE METABOLIC PANEL
ALT: 31 U/L (ref 0–53)
AST: 43 U/L — ABNORMAL HIGH (ref 0–37)
Albumin: 3.5 g/dL (ref 3.5–5.2)
Alkaline Phosphatase: 90 U/L (ref 39–117)
BUN: 11 mg/dL (ref 6–23)
CO2: 28 mEq/L (ref 19–32)
Calcium: 9 mg/dL (ref 8.4–10.5)
Chloride: 97 mEq/L (ref 96–112)
Creatinine, Ser: 0.94 mg/dL (ref 0.40–1.50)
GFR: 86.28 mL/min (ref >60.00)
Glucose, Bld: 81 mg/dL (ref 70–99)
Potassium: 4 mEq/L (ref 3.5–5.1)
Sodium: 132 mEq/L — ABNORMAL LOW (ref 135–145)
Total Bilirubin: 0.8 mg/dL (ref 0.2–1.2)
Total Protein: 6.4 g/dL (ref 6.0–8.3)

## 2022-06-22 LAB — CBC WITH DIFFERENTIAL/PLATELET
Basophils Absolute: 0 10*3/uL (ref 0.0–0.1)
Basophils Relative: 0.3 % (ref 0.0–3.0)
Eosinophils Absolute: 0.1 10*3/uL (ref 0.0–0.7)
Eosinophils Relative: 0.7 % (ref 0.0–5.0)
HCT: 34.9 % — ABNORMAL LOW (ref 39.0–52.0)
Hemoglobin: 11.5 g/dL — ABNORMAL LOW (ref 13.0–17.0)
Lymphocytes Relative: 7.6 % — ABNORMAL LOW (ref 12.0–46.0)
Lymphs Abs: 0.8 10*3/uL (ref 0.7–4.0)
MCHC: 32.9 g/dL (ref 30.0–36.0)
MCV: 82.3 fl (ref 78.0–100.0)
Monocytes Absolute: 1.8 10*3/uL — ABNORMAL HIGH (ref 0.1–1.0)
Monocytes Relative: 16.5 % — ABNORMAL HIGH (ref 3.0–12.0)
Neutro Abs: 8 10*3/uL — ABNORMAL HIGH (ref 1.4–7.7)
Neutrophils Relative %: 74.9 % (ref 43.0–77.0)
Platelets: 231 10*3/uL (ref 150.0–400.0)
RBC: 4.24 Mil/uL (ref 4.22–5.81)
RDW: 19 % — ABNORMAL HIGH (ref 11.5–15.5)
WBC: 10.7 10*3/uL — ABNORMAL HIGH (ref 4.0–10.5)

## 2022-06-22 NOTE — Patient Instructions (Signed)
Hypokalemia and Hyponatremia Mild fatigue now but no further dizziness. Former gi illness/diarrhea that appears lead to electrolyte abnormality now resolved. Continue k dur tab. Recheck cmp today. If you have dangerous low levels may need to advise return to ED.   Closed fracture of multiple ribs of right side, sequela Do incentive spirometry as advise. You have norco to use 1 tab every 6 hours as needed. When you run out let me know. Will refill but may need to modify current contract as expect may need to take more frequently than what you have been using for neck pain.  Follow up in 2 weeks or prn

## 2022-06-22 NOTE — Progress Notes (Signed)
Subjective:    Patient ID: Nathaniel Williams, male    DOB: 03-22-1960, 62 y.o.   MRN: [id number]  HPI Pt in for recent rib pain/fracture.   Pt seen yesterday in ED.    "Nathaniel Williams is a 62 y.o. male who developed lower abdominal cramping 4 days ago.  Three days ago he developed nausea and lightheadedness but without vomiting.  He did have some diarrhea which was not profuse.  His nausea and lightheadedness have persisted.  Yesterday morning he fell twice due to lightheadedness.  On 1 occasion he fell over the bathtub striking his right chest.  He is now having pain in his right ribs which she rates as a 10 out of 10, worse with breathing or movement.  He has had no food for the past 4 days but has had some liquids to drink.  He denies any abdominal pain at the present time."      ICD-10-CM    1. Generalized abdominal pain  R10.84       2. Diarrhea of presumed infectious origin  R19.7       3. Closed fracture of multiple ribs of right side, initial encounter  S22.41XA       4. Hyponatremia  E87.1       5. Hypokalemia  E87.6       6. Dehydration       IMPRESSION: Acute minimally displaced right sixth and seventh lateral rib fractures. No pneumothorax or pleural effusion.   Pt states fall was late in evening. Hit side of the tub.   Pt not having any further diarrhea. It stopped on Monday.   Infection fighting cells were elevated and mild anemia.  Pt sodium was low and potassium was also low. Pt given iv fluids. On 06-20-2022 and on 06-21-2022 na and k both low. On review unclear if labs repeat after fluids?    7:00 AM Signed out to Dr. Laverta Baltimore. Two doses of K-Dur given as well as two liters of LR. Plan is to recheck BMET and vitals to see if patient's hyponatremia, hypokalemia and lightheadedness have improved. If so, he may be stable for discharge home. He will likely need pain management for his rib fractures.  Pt is on potassium tablets.  Pt is on norco for neck pain. Pt  seeing specialist for injections in neck.    Review of Systems  Constitutional:  Positive for fatigue.       Mild  Respiratory:  Negative for cough, choking, chest tightness and stridor.   Cardiovascular:  Negative for chest pain and palpitations.  Gastrointestinal:  Negative for abdominal pain.  Genitourinary:  Negative for dysuria.  Musculoskeletal:        Rt rib pain.  Neurological:  Negative for dizziness, speech difficulty and light-headedness.  Hematological:  Negative for adenopathy. Does not bruise/bleed easily.  Psychiatric/Behavioral:  Negative for behavioral problems and confusion.     Past Medical History:  Diagnosis Date   Achalasia 1996   Dx in [place] s/p Heller Myotomy w/gastric pull-thru   Alcohol use    Aspiration pneumonia    Cervical spondylosis    Gastric ulcer    Gastritis    GI bleed    H/O hydrocele    of scrotum   Hypertension    IDA (iron deficiency anemia)      Social History   Socioeconomic History   Marital status: Legally Separated    Spouse name: Not on file   Number of children: Not  on file   Years of education: Not on file   Highest education level: Not on file  Occupational History   Not on file  Tobacco Use   Smoking status: Former    Packs/day: 0.25    Years: 18.00    Additional pack years: 0.00    Total pack years: 4.50    Types: Cigarettes   Smokeless tobacco: Never  Vaping Use   Vaping Use: Never used  Substance and Sexual Activity   Alcohol use: Not Currently    Comment: 1.5 L rum every 10 days (per hospital record 10/21/20)   Drug use: Never   Sexual activity: Not on file  Other Topics Concern   Not on file  Social History Narrative   Not on file   Social Determinants of Health   Financial Resource Strain: Not on file  Food Insecurity: Not on file  Transportation Needs: Not on file  Physical Activity: Not on file  Stress: Not on file  Social Connections: Not on file  Intimate Partner Violence: Not on file     Past Surgical History:  Procedure Laterality Date   BIOPSY  10/21/2020   Procedure: BIOPSY;  Surgeon: Jerene Bears, MD;  Location: [facility];  Service: Gastroenterology;;   ESOPHAGOGASTRODUODENOSCOPY (EGD) WITH PROPOFOL N/A 10/21/2020   Procedure: ESOPHAGOGASTRODUODENOSCOPY (EGD) WITH PROPOFOL;  Surgeon: Jerene Bears, MD;  Location: [facility];  Service: Gastroenterology;  Laterality: N/A;   [place]   in [place]; w/gastric pull thru   [place]  10/21/2020   Procedure: HEMOSTASIS CLIP PLACEMENT;  Surgeon: Jerene Bears, MD;  Location: [facility] ENDOSCOPY;  Service: Gastroenterology;;    Family History  Problem Relation Age of Onset   Cervical cancer Mother    Alzheimer's disease Father    Stomach cancer Neg Hx    Esophageal cancer Neg Hx    Pancreatic cancer Neg Hx     No Known Allergies  Current Outpatient Medications on File Prior to Visit  Medication Sig Dispense Refill   Ascorbic Acid (VITAMIN C) 1000 MG tablet Take 1,000 mg by mouth daily.     B Complex-C (SUPER B COMPLEX PO) Take 1 tablet by mouth daily.     Camphor-Menthol-Methyl Sal (TIGER BALM MUSCLE RUB EX) Apply 1 application. topically as needed (back pain).     Cholecalciferol (VITAMIN D3) 50 MCG (2000 UT) capsule Take 2,000 Units by mouth daily.     diclofenac (VOLTAREN) 75 MG EC tablet Take 75 mg by mouth 2 (two) times daily.     gabapentin (NEURONTIN) 100 MG capsule 1 tab po prior to sleep. 30 capsule 0   gabapentin (NEURONTIN) 100 MG capsule Take 1 capsule (100 mg total) by mouth every evening. 90 capsule 1   hydrALAZINE (APRESOLINE) 25 MG tablet Take 1 tablet (25 mg total) by mouth 3 (three) times daily. 90 tablet 11   HYDROcodone-acetaminophen (NORCO/VICODIN) 5-325 MG tablet Take 1 tablet by mouth every 6 (six) hours as needed for severe pain. 12 tablet 0   lidocaine (LIDODERM) 5 % Place 1 patch onto the skin daily as needed (back pain). Remove & Discard patch within 12 hours or  as directed by MD     lisinopril (ZESTRIL) 40 MG tablet Take 1 tablet (40 mg total) by mouth daily. 90 tablet 3   metoprolol succinate (TOPROL-XL) 50 MG 24 hr tablet Take 1 tablet (50 mg total) by mouth daily. TAKE WITH OR IMMEDIATELY FOLLOWING A MEAL. 30 tablet 11  metoprolol succinate (TOPROL-XL) 50 MG 24 hr tablet Take 1 tablet (50 mg total) by mouth daily. Take with or immediately following a meal. 90 tablet 3   Misc Natural Products (OSTEO BI-FLEX ADV TRIPLE ST PO) Take 1 tablet by mouth daily.     ondansetron (ZOFRAN-ODT) 4 MG disintegrating tablet Take 1 tablet (4 mg total) by mouth every 8 (eight) hours as needed. 20 tablet 0   OVER THE COUNTER MEDICATION Take 1 tablet by mouth daily. [place] MEGA MEN ENERGY AND METABOBLISM     pantoprazole (PROTONIX) 40 MG tablet Take 1 tablet (40 mg total) by mouth daily. 90 tablet 3   polyvinyl alcohol (LIQUIFILM TEARS) 1.4 % ophthalmic solution Place 1 drop into both eyes as needed for dry eyes.     potassium chloride SA (KLOR-CON M) 20 MEQ tablet Take 2 tablets (40 mEq total) by mouth daily for 5 days. 10 tablet 0   senna-docusate (SENOKOT-S) 8.6-50 MG tablet Take 1 tablet by mouth at bedtime as needed for mild constipation or moderate constipation. 60 tablet 0   Turmeric Curcumin 500 MG CAPS Take 1 capsule by mouth daily.     vitamin E 180 MG (400 UNITS) capsule Take 400 Units by mouth daily.     No current facility-administered medications on file prior to visit.    BP 124/76   Pulse 84   Temp 98.2 F (36.8 C)   Resp 18   Ht 5\' 9"  (1.753 m)   Wt 129 lb 9.6 oz (58.8 kg)   SpO2 97%   BMI 19.14 kg/m        Objective:   Physical Exam  General Mental Status- Alert. General Appearance- Not in acute distress.   Skin General: Color- Normal Color. Moisture- Normal Moisture.  Neck Carotid Arteries- Normal color. Moisture- Normal Moisture. No carotid bruits. No JVD.  Chest and Lung Exam Auscultation: Breath  Sounds:-Normal.  Cardiovascular Auscultation:Rythm- Regular. Murmurs & Other Heart Sounds:Auscultation of the heart reveals- No Murmurs.  Abdomen Inspection:-Inspeection Normal. Palpation/Percussion:Note:No mass. Palpation and Percussion of the abdomen reveal- Non Tender, Non Distended + BS, no rebound or guarding.  Neurologic Cranial Nerve exam:- CN III-XII intact(No nystagmus), symmetric smile. Strength:- 5/5 equal and symmetric strength both upper and lower extremities.   Anterior thorax- rt lower ribs tender to palpation.    Assessment & Plan:   Patient Instructions  Hypokalemia and Hyponatremia Mild fatigue now but no further dizziness. Former gi illness/diarrhea that appears lead to electrolyte abnormality now resolved. Continue k dur tab. Recheck cmp today. If you have dangerous low levels may need to advise return to ED.   Closed fracture of multiple ribs of right side, sequela Do incentive spirometry as advise. You have norco to use 1 tab every 6 hours as needed. When you run out let me know. Will refill but may need to modify current contract as expect may need to take more frequently than what you have been using for neck pain.  Follow up in 2 weeks or prn

## 2022-06-26 ENCOUNTER — Telehealth: Payer: Self-pay | Admitting: Medical

## 2022-06-26 NOTE — Telephone Encounter (Signed)
Based on labs does not need potassium or sodium supplementation beyond potassium tabs  that ED MD prescribed. How is rib pain from fractures? May need more pain meds due to rib fracuture.

## 2022-06-26 NOTE — Telephone Encounter (Signed)
With last labs does pt still need medication?

## 2022-06-26 NOTE — Telephone Encounter (Signed)
Prescription Request  06/26/2022  Is this a "Controlled Substance" medicine? No  LOV: 06/22/2022  What is the name of the medication or equipment?   Rx #: [id number]  potassium chloride SA (KLOR-CON M) 20 MEQ tablet [[id number]]   Have you contacted your pharmacy to request a refill? No   Which pharmacy would you like this sent to?  [facility] [address] [place] [address] Phone: [phone] Fax: [phone]    Patient notified that their request is being sent to the clinical staff for review and that they should receive a response within 2 business days.   Please advise at Mobile [phone] (mobile)

## 2022-06-27 ENCOUNTER — Other Ambulatory Visit (HOSPITAL_BASED_OUTPATIENT_CLINIC_OR_DEPARTMENT_OTHER): Payer: Self-pay

## 2022-06-27 MED ORDER — HYDROCODONE-ACETAMINOPHEN 5-325 MG PO TABS
1.0000 | ORAL_TABLET | Freq: Four times a day (QID) | ORAL | 0 refills | Status: DC | PRN
  Filled 2022-06-27: qty 20, 5d supply, fill #0

## 2022-06-27 NOTE — Addendum Note (Signed)
Addended by: Gwenevere Abbot on: 06/27/2022 12:01 PM   Modules accepted: Orders

## 2022-06-27 NOTE — Telephone Encounter (Signed)
Pt notified & also reminded him to keep follow up appt next thursday

## 2022-06-27 NOTE — Telephone Encounter (Signed)
Pt states his pain level with his ribs are 20/10 , they are hurting really bad

## 2022-07-06 ENCOUNTER — Other Ambulatory Visit (HOSPITAL_BASED_OUTPATIENT_CLINIC_OR_DEPARTMENT_OTHER): Payer: Self-pay

## 2022-07-06 ENCOUNTER — Encounter: Payer: Self-pay | Admitting: Medical

## 2022-07-06 ENCOUNTER — Telehealth: Payer: Self-pay

## 2022-07-06 ENCOUNTER — Ambulatory Visit (INDEPENDENT_AMBULATORY_CARE_PROVIDER_SITE_OTHER): Payer: Medicaid Other | Admitting: Medical

## 2022-07-06 VITALS — BP 150/80 | HR 68 | Temp 98.1°F | Resp 18 | Ht 69.0 in | Wt 123.8 lb

## 2022-07-06 DIAGNOSIS — I10 Essential (primary) hypertension: Secondary | ICD-10-CM | POA: Diagnosis not present

## 2022-07-06 DIAGNOSIS — S2241XA Multiple fractures of ribs, right side, initial encounter for closed fracture: Secondary | ICD-10-CM

## 2022-07-06 DIAGNOSIS — S2241XS Multiple fractures of ribs, right side, sequela: Secondary | ICD-10-CM

## 2022-07-06 DIAGNOSIS — R0781 Pleurodynia: Secondary | ICD-10-CM | POA: Diagnosis not present

## 2022-07-06 MED ORDER — HYDROCODONE-ACETAMINOPHEN 5-325 MG PO TABS
1.0000 | ORAL_TABLET | Freq: Three times a day (TID) | ORAL | 0 refills | Status: DC | PRN
  Filled 2022-07-06: qty 90, 30d supply, fill #0

## 2022-07-06 MED ORDER — HYDRALAZINE HCL 50 MG PO TABS
50.0000 mg | ORAL_TABLET | Freq: Three times a day (TID) | ORAL | 3 refills | Status: DC
  Filled 2022-07-06: qty 90, 30d supply, fill #0
  Filled 2022-08-14: qty 90, 30d supply, fill #1
  Filled 2022-10-06: qty 90, 30d supply, fill #2
  Filled 2022-11-13: qty 51, 17d supply, fill #3
  Filled 2022-11-13: qty 39, 13d supply, fill #3

## 2022-07-06 NOTE — Telephone Encounter (Signed)
PA initiated via Covermymeds; KEY: [id number]. Awaiting determination.

## 2022-07-06 NOTE — Telephone Encounter (Signed)
Contract updated today with protocol --   NORCO 5-325 -- 90 tablets -- take 1 tablet every 8 hours

## 2022-07-06 NOTE — Telephone Encounter (Signed)
PA approved.   Approved. This drug has been approved. Approved quantity: 90 tablets per 30 day(s). You may fill up to a 34 day supply at a retail pharmacy. You may fill up to a 90 day supply for maintenance drugs, please refer to the formulary for details. Please call the pharmacy to process your prescription claim. Authorization Expiration Date: 01/02/2023

## 2022-07-06 NOTE — Progress Notes (Signed)
Subjective:    Patient ID: Nathaniel Williams, male    DOB: [birth year], 62 y.o.   MRN: [id number]  HPI  Pt in for follow up.  Pt is still having rt side rib pain due to rib fractures. Pt has been requiring norco every 6-8 hours. Pt also has chronic neck pain. For a while his neck pain was better but then got the rib.  Pt is using incentive spirometry.  Since last visit plan was to see how much pain he was in. Update pain contract due to rib fractures.  Htn- bp recently at home has been 150-160 systolic/80-90. On lisinopri, toprol and hydralazine.    Review of Systems  Constitutional:  Negative for chills, fatigue and fever.  Respiratory:  Negative for choking, shortness of breath and wheezing.   Cardiovascular:  Negative for chest pain and palpitations.  Gastrointestinal:  Negative for abdominal pain and blood in stool.  Musculoskeletal:  Positive for neck pain.       Rt rib pain  Skin:  Negative for rash.  Neurological:  Negative for dizziness, seizures and light-headedness.  Hematological:  Negative for adenopathy. Does not bruise/bleed easily.  Psychiatric/Behavioral:  Negative for behavioral problems and decreased concentration.    Past Medical History:  Diagnosis Date   Achalasia 1996   Dx in [place] s/p Heller Myotomy w/gastric pull-thru   Alcohol use    Aspiration pneumonia    Cervical spondylosis    Gastric ulcer    Gastritis    GI bleed    H/O hydrocele    of scrotum   Hypertension    IDA (iron deficiency anemia)      Social History   Socioeconomic History   Marital status: Legally Separated    Spouse name: Not on file   Number of children: Not on file   Years of education: Not on file   Highest education level: Not on file  Occupational History   Not on file  Tobacco Use   Smoking status: Former    Packs/day: 0.25    Years: 18.00    Additional pack years: 0.00    Total pack years: 4.50    Types: Cigarettes   Smokeless tobacco: Never  Vaping  Use   Vaping Use: Never used  Substance and Sexual Activity   Alcohol use: Not Currently    Comment: 1.5 L rum every 10 days (per hospital record 10/21/20)   Drug use: Never   Sexual activity: Not on file  Other Topics Concern   Not on file  Social History Narrative   Not on file   Social Determinants of Health   Financial Resource Strain: Not on file  Food Insecurity: Not on file  Transportation Needs: Not on file  Physical Activity: Not on file  Stress: Not on file  Social Connections: Not on file  Intimate Partner Violence: Not on file    Past Surgical History:  Procedure Laterality Date   BIOPSY  10/21/2020   Procedure: BIOPSY;  Surgeon: Beverley Fiedler, MD;  Location: [facility] ENDOSCOPY;  Service: Gastroenterology;;   ESOPHAGOGASTRODUODENOSCOPY (EGD) WITH PROPOFOL N/A 10/21/2020   Procedure: ESOPHAGOGASTRODUODENOSCOPY (EGD) WITH PROPOFOL;  Surgeon: Beverley Fiedler, MD;  Location: [facility] ENDOSCOPY;  Service: Gastroenterology;  Laterality: N/A;   HELLER MYOTOMY  1996   in [place]; w/gastric pull thru   HEMOSTASIS CLIP PLACEMENT  10/21/2020   Procedure: HEMOSTASIS CLIP PLACEMENT;  Surgeon: Beverley Fiedler, MD;  Location: [facility] ENDOSCOPY;  Service: Gastroenterology;;    Family  History  Problem Relation Age of Onset   Cervical cancer Mother    Alzheimer's disease Father    Stomach cancer Neg Hx    Esophageal cancer Neg Hx    Pancreatic cancer Neg Hx     No Known Allergies  Current Outpatient Medications on File Prior to Visit  Medication Sig Dispense Refill   Ascorbic Acid (VITAMIN C) 1000 MG tablet Take 1,000 mg by mouth daily.     B Complex-C (SUPER B COMPLEX PO) Take 1 tablet by mouth daily.     Camphor-Menthol-Methyl Sal (TIGER BALM MUSCLE RUB EX) Apply 1 application. topically as needed (back pain).     Cholecalciferol (VITAMIN D3) 50 MCG (2000 UT) capsule Take 2,000 Units by mouth daily.     diclofenac (VOLTAREN) 75 MG EC tablet Take 75 mg by mouth 2 (two) times daily.      gabapentin (NEURONTIN) 100 MG capsule 1 tab po prior to sleep. 30 capsule 0   gabapentin (NEURONTIN) 100 MG capsule Take 1 capsule (100 mg total) by mouth every evening. 90 capsule 1   HYDROcodone-acetaminophen (NORCO/VICODIN) 5-325 MG tablet Take 1 tablet by mouth every 6 (six) hours as needed for severe pain. 20 tablet 0   lidocaine (LIDODERM) 5 % Place 1 patch onto the skin daily as needed (back pain). Remove & Discard patch within 12 hours or as directed by MD     lisinopril (ZESTRIL) 40 MG tablet Take 1 tablet (40 mg total) by mouth daily. 90 tablet 3   metoprolol succinate (TOPROL-XL) 50 MG 24 hr tablet Take 1 tablet (50 mg total) by mouth daily. TAKE WITH OR IMMEDIATELY FOLLOWING A MEAL. 30 tablet 11   metoprolol succinate (TOPROL-XL) 50 MG 24 hr tablet Take 1 tablet (50 mg total) by mouth daily. Take with or immediately following a meal. 90 tablet 3   Misc Natural Products (OSTEO BI-FLEX ADV TRIPLE ST PO) Take 1 tablet by mouth daily.     ondansetron (ZOFRAN-ODT) 4 MG disintegrating tablet Take 1 tablet (4 mg total) by mouth every 8 (eight) hours as needed. 20 tablet 0   OVER THE COUNTER MEDICATION Take 1 tablet by mouth daily. [employer] MEGA MEN ENERGY AND METABOBLISM     pantoprazole (PROTONIX) 40 MG tablet Take 1 tablet (40 mg total) by mouth daily. 90 tablet 3   polyvinyl alcohol (LIQUIFILM TEARS) 1.4 % ophthalmic solution Place 1 drop into both eyes as needed for dry eyes.     potassium chloride SA (KLOR-CON M) 20 MEQ tablet Take 2 tablets (40 mEq total) by mouth daily for 5 days. 10 tablet 0   senna-docusate (SENOKOT-S) 8.6-50 MG tablet Take 1 tablet by mouth at bedtime as needed for mild constipation or moderate constipation. 60 tablet 0   Turmeric Curcumin 500 MG CAPS Take 1 capsule by mouth daily.     vitamin E 180 MG (400 UNITS) capsule Take 400 Units by mouth daily.     No current facility-administered medications on file prior to visit.    BP (!) 150/80   Pulse 68   Temp 98.1 F  (36.7 C)   Resp 18   Ht  (1.753 m)   Wt 123 lb 12.8 oz (56.2 kg)   SpO2 98%   BMI 18.28 kg/m           Objective:   Physical Exam  Mental Status- Alert. General Appearance- Not in acute distress.    Skin General: Color- Normal Color. Moisture- Normal Moisture.  Neck Carotid Arteries- Normal color. Moisture- Normal Moisture. No carotid bruits. No JVD.   Chest and Lung Exam Auscultation: Breath Sounds:-Normal.   Cardiovascular Auscultation:Rythm- Regular. Murmurs & Other Heart Sounds:Auscultation of the heart reveals- No Murmurs.    Neurologic Cranial Nerve exam:- CN III-XII intact(No nystagmus), symmetric smile. Strength:- 5/5 equal and symmetric strength both upper and lower extremities.    Anterior thorax- rt lower ribs tender to palpation.      Assessment & Plan:   Patient Instructions  1. Closed fracture of multiple ribs of right side, sequela with moderate to ever daily  Rib pain.  Will rx higher number norco per month. I think by 6-8 weeks pain will taper off. Update contract you had for neck pain.   2 Hypertension, unspecified type Continue lisinopril and toprol same dose. Increase hydralazine to 50 mg three times daily.  Follow up in 4 weeks or sooner if needed      [employer], PA-C

## 2022-07-06 NOTE — Patient Instructions (Signed)
1. Closed fracture of multiple ribs of right side, sequela with moderate to ever daily  Rib pain.  Will rx higher number norco per month. I think by 6-8 weeks pain will taper off. Update contract you had for neck pain.   2 Hypertension, unspecified type Continue lisinopril and toprol same dose. Increase hydralazine to 50 mg three times daily.  Follow up in 4 weeks or sooner if needed

## 2022-08-07 ENCOUNTER — Other Ambulatory Visit (HOSPITAL_BASED_OUTPATIENT_CLINIC_OR_DEPARTMENT_OTHER): Payer: Self-pay

## 2022-08-07 ENCOUNTER — Ambulatory Visit (INDEPENDENT_AMBULATORY_CARE_PROVIDER_SITE_OTHER): Payer: Medicaid Other | Admitting: Medical

## 2022-08-07 VITALS — BP 130/88 | HR 80 | Resp 18 | Ht 69.0 in | Wt 112.0 lb

## 2022-08-07 DIAGNOSIS — Z111 Encounter for screening for respiratory tuberculosis: Secondary | ICD-10-CM | POA: Diagnosis not present

## 2022-08-07 DIAGNOSIS — S2241XS Multiple fractures of ribs, right side, sequela: Secondary | ICD-10-CM | POA: Diagnosis not present

## 2022-08-07 DIAGNOSIS — M542 Cervicalgia: Secondary | ICD-10-CM

## 2022-08-07 MED ORDER — HYDROCODONE-ACETAMINOPHEN 5-325 MG PO TABS
1.0000 | ORAL_TABLET | Freq: Three times a day (TID) | ORAL | 0 refills | Status: DC | PRN
  Filled 2022-08-07: qty 90, 30d supply, fill #0

## 2022-08-07 NOTE — Progress Notes (Signed)
Subjective:    Patient ID: Nathaniel Williams, male    DOB: [birth year], 62 y.o.   MRN: [id number]  HPI  Pt in for follow up.  Pt stats his rib pain has finally eased up. He had "Closed fracture of multiple ribs of right side". He had used norco for rib fx.   What is bothering pt more now is his neck pain. He has known chronic neck pain. He sees orthopedist. Last A/P below from specialist.  "ASSESSMENT Patient educated on need to take care of self with following ER recommendations for fluids. Patient blood pressure is stable today and denies abdominal discomfort, reports pain from rib fractures due to falls. Patient states he wants to continue with therapy and emphasis today on manual therapy. Noted with treatment intervention reports decreased pain from 10/10 to 7/10 noted with increased extensibility and improved mobility. May re-attempt modalities next session, continues with soft tissue and strengthening postural muscles.   Therapy Diagnosis:  ICD-10-CM  1. 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 Decreased range of motion with decreased strength M25.60, R53.1  3. Abnormal posture R29.3   Progress Towards Goals: addressing  Goals Addressed  None    PLAN Treatment Frequency and Duration: PT Frequency: 2x/week PT Duration: 8 weeks Treatment Plan Details: 2x week x 8 weeks POC 07-21-22  Recommended PT Treatment/Interventions: Manual therapy (97140); Neuromuscular re-education [phone]); Orthotics fit/training (97760/97763); Mobility; Gait training (19147); Body mechanics; Therapeutic exercise (97110); Therapeutic activity (97530); Therapeutic massage [phone]); Electrical stimulation-unattended (21308); NMES; Ultrasound (65784); Electrical stimulation-attended (69629); Hot/Cold therapy  Recommended Consults: None currently  Development of Plan of Care: Patient participated in plan of care development."  Pt describes he is 10 days out from doing epidural injections. He states  more than 3 years ago he had injection and did help some back then.   Currently his neck pain is severe all week. He states close to 10/10.   Pt has contract with me and has been under 90 tabs a month. Pt has been taking norco 5/325 mg 1 tab every 8 hours. He has about 10 tab presently.   Pt wants tb blood test to be done. No signs/symptoms. 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  Review of Systems  Constitutional:  Negative for chills, fatigue and fever.  Respiratory:  Negative for cough, chest tightness, shortness of breath and wheezing.   Cardiovascular:  Negative for chest pain and palpitations.  Gastrointestinal:  Negative for abdominal pain.  Genitourinary:  Negative for dysuria, flank pain, genital sores and hematuria.  Musculoskeletal:  Negative for back pain, joint swelling, myalgias and neck pain.  Skin:  Negative for rash.  Neurological:  Negative for dizziness, seizures, speech difficulty, light-headedness and headaches.  Hematological:  Negative for adenopathy. Does not bruise/bleed easily.  Psychiatric/Behavioral:  Negative for behavioral problems and confusion.     Past Medical History:  Diagnosis Date   Achalasia 1996   Dx in [place] s/p Heller Myotomy w/gastric pull-thru   Alcohol use    Aspiration pneumonia (HCC)    Cervical spondylosis    Gastric ulcer    Gastritis    GI bleed    H/O hydrocele    of scrotum   Hypertension    IDA (iron deficiency anemia)      Social History   Socioeconomic History   Marital status: Legally Separated    Spouse name: Not on file   Number of children: Not on file  Years of education: Not on file   Highest education level: Not on file  Occupational History   Not on file  Tobacco Use   Smoking status: Former    Packs/day: 0.25    Years: 18.00    Additional pack years: 0.00    Total pack years: 4.50    Types: Cigarettes   Smokeless tobacco: Never  Vaping Use   Vaping Use:  Never used  Substance and Sexual Activity   Alcohol use: Not Currently    Comment: 1.5 L rum every 10 days (per hospital record 10/21/20)   Drug use: Never   Sexual activity: Not on file  Other Topics Concern   Not on file  Social History Narrative   Not on file   Social Determinants of Health   Financial Resource Strain: Not on file  Food Insecurity: Not on file  Transportation Needs: Not on file  Physical Activity: Not on file  Stress: Not on file  Social Connections: Not on file  Intimate Partner Violence: Not on file    Past Surgical History:  Procedure Laterality Date   BIOPSY  10/21/2020   Procedure: BIOPSY;  Surgeon: Beverley Fiedler, MD;  Location: [facility] ENDOSCOPY;  Service: Gastroenterology;;   ESOPHAGOGASTRODUODENOSCOPY (EGD) WITH PROPOFOL N/A 10/21/2020   Procedure: ESOPHAGOGASTRODUODENOSCOPY (EGD) WITH PROPOFOL;  Surgeon: Beverley Fiedler, MD;  Location: [facility] ENDOSCOPY;  Service: Gastroenterology;  Laterality: N/A;   HELLER MYOTOMY  1996   in [place]; w/gastric pull thru   HEMOSTASIS CLIP PLACEMENT  10/21/2020   Procedure: HEMOSTASIS CLIP PLACEMENT;  Surgeon: Beverley Fiedler, MD;  Location: [facility] ENDOSCOPY;  Service: Gastroenterology;;    Family History  Problem Relation Age of Onset   Cervical cancer Mother    Alzheimer's disease Father    Stomach cancer Neg Hx    Esophageal cancer Neg Hx    Pancreatic cancer Neg Hx     No Known Allergies  Current Outpatient Medications on File Prior to Visit  Medication Sig Dispense Refill   Ascorbic Acid (VITAMIN C) 1000 MG tablet Take 1,000 mg by mouth daily.     B Complex-C (SUPER B COMPLEX PO) Take 1 tablet by mouth daily.     Camphor-Menthol-Methyl Sal (TIGER BALM MUSCLE RUB EX) Apply 1 application. topically as needed (back pain).     Cholecalciferol (VITAMIN D3) 50 MCG (2000 UT) capsule Take 2,000 Units by mouth daily.     diclofenac (VOLTAREN) 75 MG EC tablet Take 75 mg by mouth 2 (two) times daily.     gabapentin (NEURONTIN)  100 MG capsule 1 tab po prior to sleep. 30 capsule 0   gabapentin (NEURONTIN) 100 MG capsule Take 1 capsule (100 mg total) by mouth every evening. 90 capsule 1   hydrALAZINE (APRESOLINE) 50 MG tablet Take 1 tablet (50 mg total) by mouth 3 (three) times daily. 90 tablet 3   HYDROcodone-acetaminophen (NORCO) 5-325 MG tablet Take 1 tablet by mouth every 8 (eight) hours as needed for severe pain. 90 tablet 0   lidocaine (LIDODERM) 5 % Place 1 patch onto the skin daily as needed (back pain). Remove & Discard patch within 12 hours or as directed by MD     lisinopril (ZESTRIL) 40 MG tablet Take 1 tablet (40 mg total) by mouth daily. 90 tablet 3   metoprolol succinate (TOPROL-XL) 50 MG 24 hr tablet Take 1 tablet (50 mg total) by mouth daily. TAKE WITH OR IMMEDIATELY FOLLOWING A MEAL. 30 tablet 11   metoprolol succinate (  TOPROL-XL) 50 MG 24 hr tablet Take 1 tablet (50 mg total) by mouth daily. Take with or immediately following a meal. 90 tablet 3   Misc Natural Products (OSTEO BI-FLEX ADV TRIPLE ST PO) Take 1 tablet by mouth daily.     ondansetron (ZOFRAN-ODT) 4 MG disintegrating tablet Take 1 tablet (4 mg total) by mouth every 8 (eight) hours as needed. 20 tablet 0   OVER THE COUNTER MEDICATION Take 1 tablet by mouth daily. [employer] MEN ENERGY AND METABOBLISM     pantoprazole (PROTONIX) 40 MG tablet Take 1 tablet (40 mg total) by mouth daily. 90 tablet 3   polyvinyl alcohol (LIQUIFILM TEARS) 1.4 % ophthalmic solution Place 1 drop into both eyes as needed for dry eyes.     potassium chloride SA (KLOR-CON M) 20 MEQ tablet Take 2 tablets (40 mEq total) by mouth daily for 5 days. 10 tablet 0   senna-docusate (SENOKOT-S) 8.6-50 MG tablet Take 1 tablet by mouth at bedtime as needed for mild constipation or moderate constipation. 60 tablet 0   Turmeric Curcumin 500 MG CAPS Take 1 capsule by mouth daily.     vitamin E 180 MG (400 UNITS) capsule Take 400 Units by mouth daily.     No current facility-administered  medications on file prior to visit.    BP 130/88   Pulse 80   Resp 18   Ht 5\' 9"  (1.753 m)   Wt 112 lb (50.8 kg)   SpO2 98%   BMI 16.54 kg/m        Objective:   Physical Exam   Mental Status- Alert. General Appearance- Not in acute distress.    Skin General: Color- Normal Color. Moisture- Normal Moisture.   Neck Lower neck. Para-cervical tenderness to palpation.   Chest and Lung Exam Auscultation: Breath Sounds:-Normal.   Cardiovascular Auscultation:Rythm- Regular. Murmurs & Other Heart Sounds:Auscultation of the heart reveals- No Murmurs.     Neurologic Cranial Nerve exam:- CN III-XII intact(No nystagmus), symmetric smile. Strength:- 5/5 equal and symmetric strength both upper and lower extremities.    Anterior thorax- on faint low level residual dicomfort now on palpation.     Assessment & Plan:   Patient Instructions  1. Screening-pulmonary TB - Quantiferon tb gold assay  2. Closed fracture of multiple ribs of right side, sequela No further narcotic needed or rib fracture. Pain decreased to residual faint pain now.  3. Neck painArthropathy of cervical facet joint, decreased range of motion with decreased strength and abnormal posture.  Refilled your norco 90 tab rx.(Up to date on contract and uds) Over next 10 days can increase to 1-2 tab every 8 hours as you await for the injections. Hopefully when you start injection you can go back to just 1 every 8 hours  or even less. Update on how you are doing after the injection.   Follow up up date to be determined aft you update me on response to the injection.    Esperanza Richters, PA-C

## 2022-08-07 NOTE — Addendum Note (Signed)
Addended by: Mervin Kung A on: 08/07/2022 11:30 AM   Modules accepted: Orders

## 2022-08-07 NOTE — Patient Instructions (Addendum)
1. Screening-pulmonary TB - Quantiferon tb gold assay  2. Closed fracture of multiple ribs of right side, sequela No further narcotic needed or rib fracture. Pain decreased to residual faint pain now.  3. Neck painArthropathy of cervical facet joint, decreased range of motion with decreased strength and abnormal posture.  Refilled your norco 90 tab rx.(Up to date on contract and uds) Over next 10 days can increase to 1-2 tab every 8 hours as you await for the injections. Hopefully when you start injection you can go back to just 1 every 8 hours  or even less. Update on how you are doing after the injection.   Follow up up date to be determined aft you update me on response to the injection.

## 2022-08-09 LAB — QUANTIFERON-TB GOLD PLUS
Mitogen-NIL: 8.65 IU/mL
NIL: 0.02 IU/mL
QuantiFERON-TB Gold Plus: NEGATIVE
TB1-NIL: 0.02 IU/mL
TB2-NIL: 0.01 IU/mL

## 2022-08-23 ENCOUNTER — Other Ambulatory Visit (HOSPITAL_BASED_OUTPATIENT_CLINIC_OR_DEPARTMENT_OTHER): Payer: Self-pay

## 2022-08-23 ENCOUNTER — Encounter: Payer: Self-pay | Admitting: Medical

## 2022-08-23 ENCOUNTER — Ambulatory Visit (INDEPENDENT_AMBULATORY_CARE_PROVIDER_SITE_OTHER): Payer: Medicaid Other | Admitting: Medical

## 2022-08-23 ENCOUNTER — Ambulatory Visit (HOSPITAL_BASED_OUTPATIENT_CLINIC_OR_DEPARTMENT_OTHER)
Admission: RE | Admit: 2022-08-23 | Discharge: 2022-08-23 | Disposition: A | Discharge status: Home / Self Care | Payer: Medicaid Other | Source: Ambulatory Visit | Attending: Medical | Admitting: Medical

## 2022-08-23 VITALS — BP 138/85 | HR 79 | Resp 18 | Ht 69.0 in | Wt 112.8 lb

## 2022-08-23 DIAGNOSIS — G8929 Other chronic pain: Secondary | ICD-10-CM

## 2022-08-23 DIAGNOSIS — M545 Low back pain, unspecified: Secondary | ICD-10-CM

## 2022-08-23 DIAGNOSIS — I10 Essential (primary) hypertension: Secondary | ICD-10-CM

## 2022-08-23 DIAGNOSIS — M542 Cervicalgia: Secondary | ICD-10-CM | POA: Diagnosis not present

## 2022-08-23 NOTE — Progress Notes (Signed)
Subjective:    Patient ID: Nathaniel Williams, male    DOB: November 17, 1960, 62 y.o.   MRN: [id number]  HPI  Discussed the use of AI scribe software for clinical note transcription with the patient, who gave verbal consent to proceed.  History of Present Illness   The patient, with a history of chronic neck and lower back pain, presents for a follow-up visit after receiving four out of six planned injections for nerve degeneration in the neck. He reports a significant reduction in neck pain, estimating a 50% improvement. However, he notes an exacerbation of lower back pain, which is currently more bothersome than the neck pain. The lower back pain, attributed to a previous worker's compensation injury, has been managed with over-the-counter Lidocaine patches, which he reports as only partially effective. He has been taking Norco for combined neck and lower back pain, which he feels is currently sufficient for his pain management.  He has a history of stomach issues, including a bleeding ulcer, which has led to avoidance of non-steroidal anti-inflammatory drugs. The patient also has hypertension, managed with Lisinopril 40 mg and Metoprolol 50 mg XL daily. He has been monitoring his blood pressure at home, with recent readings around 134/78.  The patient's lower back pain has been ongoing since last year, with recent flare-ups in the past few weeks. The pain is localized to the lower back without radiation to the legs. The patient is not currently working and spends most of his time at home. 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         Review of Systems  Constitutional:  Negative for chills, fatigue and fever.  HENT:  Negative for congestion.   Respiratory:  Negative for cough, chest tightness, shortness of breath and wheezing.   Cardiovascular:  Negative for chest pain and palpitations.  Gastrointestinal:  Negative for abdominal pain, nausea and vomiting.  Genitourinary:   Negative for dysuria, flank pain and frequency.  Musculoskeletal:  Positive for back pain and neck pain.  Skin:  Negative for rash.  Neurological:  Negative for dizziness, weakness and light-headedness.  Hematological:  Negative for adenopathy. Does not bruise/bleed easily.  Psychiatric/Behavioral:  Negative for behavioral problems and decreased concentration.     Past Medical History:  Diagnosis Date   Achalasia 1996   Dx in [place] s/p Heller Myotomy w/gastric pull-thru   Alcohol use    Aspiration pneumonia (HCC)    Cervical spondylosis    Gastric ulcer    Gastritis    GI bleed    H/O hydrocele    of scrotum   Hypertension    IDA (iron deficiency anemia)      Social History   Socioeconomic History   Marital status: Legally Separated    Spouse name: Not on file   Number of children: Not on file   Years of education: Not on file   Highest education level: Not on file  Occupational History   Not on file  Tobacco Use   Smoking status: Former    Packs/day: 0.25    Years: 18.00    Additional pack years: 0.00    Total pack years: 4.50    Types: Cigarettes   Smokeless tobacco: Never  Vaping Use   Vaping Use: Never used  Substance and Sexual Activity   Alcohol use: Not Currently    Comment: 1.5 L rum every 10 days (per hospital record 10/21/20)   Drug use: Never   Sexual activity: Not  on file  Other Topics Concern   Not on file  Social History Narrative   Not on file   Social Determinants of Health   Financial Resource Strain: Not on file  Food Insecurity: Not on file  Transportation Needs: Not on file  Physical Activity: Not on file  Stress: Not on file  Social Connections: Not on file  Intimate Partner Violence: Not on file    Past Surgical History:  Procedure Laterality Date   BIOPSY  10/21/2020   Procedure: BIOPSY;  Surgeon: Beverley Fiedler, MD;  Location: [facility] ENDOSCOPY;  Service: Gastroenterology;;   ESOPHAGOGASTRODUODENOSCOPY (EGD) WITH PROPOFOL N/A  10/21/2020   Procedure: ESOPHAGOGASTRODUODENOSCOPY (EGD) WITH PROPOFOL;  Surgeon: Beverley Fiedler, MD;  Location: [facility] ENDOSCOPY;  Service: Gastroenterology;  Laterality: N/A;   HELLER MYOTOMY  1996   in [place]; w/gastric pull thru   HEMOSTASIS CLIP PLACEMENT  10/21/2020   Procedure: HEMOSTASIS CLIP PLACEMENT;  Surgeon: Beverley Fiedler, MD;  Location: [facility] ENDOSCOPY;  Service: Gastroenterology;;    Family History  Problem Relation Age of Onset   Cervical cancer Mother    Alzheimer's disease Father    Stomach cancer Neg Hx    Esophageal cancer Neg Hx    Pancreatic cancer Neg Hx     No Known Allergies  Current Outpatient Medications on File Prior to Visit  Medication Sig Dispense Refill   Ascorbic Acid (VITAMIN C) 1000 MG tablet Take 1,000 mg by mouth daily.     B Complex-C (SUPER B COMPLEX PO) Take 1 tablet by mouth daily.     Camphor-Menthol-Methyl Sal (TIGER BALM MUSCLE RUB EX) Apply 1 application. topically as needed (back pain).     Cholecalciferol (VITAMIN D3) 50 MCG (2000 UT) capsule Take 2,000 Units by mouth daily.     diclofenac (VOLTAREN) 75 MG EC tablet Take 75 mg by mouth 2 (two) times daily.     gabapentin (NEURONTIN) 100 MG capsule 1 tab po prior to sleep. 30 capsule 0   gabapentin (NEURONTIN) 100 MG capsule Take 1 capsule (100 mg total) by mouth every evening. 90 capsule 1   hydrALAZINE (APRESOLINE) 50 MG tablet Take 1 tablet (50 mg total) by mouth 3 (three) times daily. 90 tablet 3   HYDROcodone-acetaminophen (NORCO) 5-325 MG tablet Take 1 tablet by mouth every 8 (eight) hours as needed for severe pain. 90 tablet 0   lidocaine (LIDODERM) 5 % Place 1 patch onto the skin daily as needed (back pain). Remove & Discard patch within 12 hours or as directed by MD     lisinopril (ZESTRIL) 40 MG tablet Take 1 tablet (40 mg total) by mouth daily. 90 tablet 3   metoprolol succinate (TOPROL-XL) 50 MG 24 hr tablet Take 1 tablet (50 mg total) by mouth daily. TAKE WITH OR IMMEDIATELY  FOLLOWING A MEAL. 30 tablet 11   metoprolol succinate (TOPROL-XL) 50 MG 24 hr tablet Take 1 tablet (50 mg total) by mouth daily. Take with or immediately following a meal. 90 tablet 3   Misc Natural Products (OSTEO BI-FLEX ADV TRIPLE ST PO) Take 1 tablet by mouth daily.     ondansetron (ZOFRAN-ODT) 4 MG disintegrating tablet Take 1 tablet (4 mg total) by mouth every 8 (eight) hours as needed. 20 tablet 0   OVER THE COUNTER MEDICATION Take 1 tablet by mouth daily. [employer] METABOBLISM     pantoprazole (PROTONIX) 40 MG tablet Take 1 tablet (40 mg total) by mouth daily. 90 tablet 3  polyvinyl alcohol (LIQUIFILM TEARS) 1.4 % ophthalmic solution Place 1 drop into both eyes as needed for dry eyes.     potassium chloride SA (KLOR-CON M) 20 MEQ tablet Take 2 tablets (40 mEq total) by mouth daily for 5 days. 10 tablet 0   senna-docusate (SENOKOT-S) 8.6-50 MG tablet Take 1 tablet by mouth at bedtime as needed for mild constipation or moderate constipation. 60 tablet 0   Turmeric Curcumin 500 MG CAPS Take 1 capsule by mouth daily.     vitamin E 180 MG (400 UNITS) capsule Take 400 Units by mouth daily.     No current facility-administered medications on file prior to visit.    BP 138/85   Pulse 79   Resp 18   Ht 5\' 9"  (1.753 m)   Wt 112 lb 12.8 oz (51.2 kg)   SpO2 98%   BMI 16.66 kg/m        Objective:   Physical Exam  General Appearance- Not in acute distress.  Neck - no pain on palpatoin presently.  Chest and Lung Exam Auscultation: Breath sounds:-Normal. Clear even and unlabored. Adventitious sounds:- No Adventitious sounds.  Cardiovascular Auscultation:Rythm - Regular, rate and rythm. Heart Sounds -Normal heart sounds.  Abdomen Inspection:-Inspection Normal.  Palpation/Perucssion: Palpation and Percussion of the abdomen reveal- Non Tender, No Rebound tenderness, No rigidity(Guarding) and No Palpable abdominal masses.  Liver:-Normal.  Spleen:- Normal.   Back Mid  lumbar spine tenderness to palpation.  No Pain on straight leg lift. Pain on lateral movements and flexion/extension of the spine.  Lower ext neurologic  L5-S1 sensation intact bilaterally. Normal patellar reflexes bilaterally. No foot drop bilaterally.       Assessment & Plan:   Assessment and Plan    Neck pain: Improvement in neck pain following 4 out of 6 planned injections. 50% reduction in pain reported. -Continue Norco 1 tablet every 8 hours as needed for pain. -Return for remaining injections on 10/11/2022.  Lumbar Back Pain: Flare of chronic lower back pain, previously managed with over-the-counter Lidocaine patches. No radicular symptoms reported. -Continue Norco 1 tablet every 8 hours as needed for pain. -Order lumbar x-ray to evaluate for any changes since last imaging.   Hypertension: Blood pressure slightly elevated at today's visit, but patient reports lower readings at home. -Continue Lisinopril 40mg  daily and Metoprolol 50mg  XL daily. -Check blood pressure daily for the next 4 days and report readings via MyChart or phone call.  Follow-up in 6 months or sooner if needed for pain management. keep updated on uds and controlled med contract       [employer]

## 2022-08-23 NOTE — Patient Instructions (Signed)
Neck pain: Improvement in neck pain following 4 out of 6 planned injections. 50% reduction in pain reported. -Continue Norco 1 tablet every 8 hours as needed for pain. -Return for remaining injections on 10/11/2022.  Lumbar Back Pain: Flare of chronic lower back pain, previously managed with over-the-counter Lidocaine patches. No radicular symptoms reported. -Continue Norco 1 tablet every 8 hours as needed for pain. -Order lumbar x-ray to evaluate for any changes since last imaging.   Hypertension: Blood pressure slightly elevated at today's visit, but patient reports lower readings at home. -Continue Lisinopril 40mg  daily and Metoprolol 50mg  XL daily. -Check blood pressure daily for the next 4 days and report readings via MyChart or phone call.  Follow-up in 6 months or sooner if needed for pain management. keep updated on uds and controlled med contract

## 2022-08-29 ENCOUNTER — Other Ambulatory Visit (HOSPITAL_BASED_OUTPATIENT_CLINIC_OR_DEPARTMENT_OTHER): Payer: Self-pay

## 2022-08-29 ENCOUNTER — Telehealth: Payer: Self-pay | Admitting: Medical

## 2022-08-29 MED ORDER — SENNOSIDES-DOCUSATE SODIUM 8.6-50 MG PO TABS
1.0000 | ORAL_TABLET | Freq: Every evening | ORAL | 1 refills | Status: DC | PRN
  Filled 2022-08-29: qty 100, 90d supply, fill #0

## 2022-08-29 NOTE — Telephone Encounter (Signed)
Medication refill request:   Medication: senna-docusate (SENOKOT-S) 8.6-50 MG tablet   Pharmacy: [facility]   Last Visit: 6.5.24  Next Visit: 7.8.24

## 2022-09-11 ENCOUNTER — Other Ambulatory Visit (HOSPITAL_BASED_OUTPATIENT_CLINIC_OR_DEPARTMENT_OTHER): Payer: Self-pay

## 2022-09-25 ENCOUNTER — Other Ambulatory Visit (HOSPITAL_BASED_OUTPATIENT_CLINIC_OR_DEPARTMENT_OTHER): Payer: Self-pay

## 2022-09-25 ENCOUNTER — Ambulatory Visit (INDEPENDENT_AMBULATORY_CARE_PROVIDER_SITE_OTHER): Payer: Medicaid Other | Admitting: Medical

## 2022-09-25 VITALS — BP 137/71 | HR 75 | Temp 98.0°F | Resp 16 | Ht 69.0 in | Wt 109.2 lb

## 2022-09-25 DIAGNOSIS — Z86010 Personal history of colonic polyps: Secondary | ICD-10-CM

## 2022-09-25 DIAGNOSIS — M542 Cervicalgia: Secondary | ICD-10-CM | POA: Diagnosis not present

## 2022-09-25 DIAGNOSIS — R739 Hyperglycemia, unspecified: Secondary | ICD-10-CM | POA: Diagnosis not present

## 2022-09-25 DIAGNOSIS — F32A Depression, unspecified: Secondary | ICD-10-CM

## 2022-09-25 DIAGNOSIS — G8929 Other chronic pain: Secondary | ICD-10-CM

## 2022-09-25 DIAGNOSIS — R634 Abnormal weight loss: Secondary | ICD-10-CM

## 2022-09-25 DIAGNOSIS — Z125 Encounter for screening for malignant neoplasm of prostate: Secondary | ICD-10-CM

## 2022-09-25 DIAGNOSIS — M545 Low back pain, unspecified: Secondary | ICD-10-CM | POA: Diagnosis not present

## 2022-09-25 DIAGNOSIS — I10 Essential (primary) hypertension: Secondary | ICD-10-CM

## 2022-09-25 MED ORDER — METHYLPREDNISOLONE 4 MG PO TBPK
ORAL_TABLET | ORAL | 0 refills | Status: AC
  Filled 2022-09-25: qty 21, 6d supply, fill #0

## 2022-09-25 MED ORDER — PAROXETINE HCL 10 MG PO TABS
10.0000 mg | ORAL_TABLET | Freq: Every day | ORAL | 0 refills | Status: DC
  Filled 2022-09-25: qty 30, 30d supply, fill #0

## 2022-09-25 NOTE — Patient Instructions (Addendum)
Hypertension: Blood pressure well controlled. -Continue Lisinopril 40mg  daily,hydralazie 50 mg three times daily.  and Metoprolol 50mg  XL daily.   Neck pain  Chronic midline low back pain without sciatica Continue norco. Will add on 6 day taper medrol to see if get some relief. If not may need to consider pain management.   History of colon polyps With wt loss of 20 lb since April do want to have GI evaluate you for possible repeat colonoscopy or egd if indicated. - Ambulatory referral to Gastroenterology  Depression, unspecified depression type Rx paxil 10 mg daily. Rx advisement given. Will if if with better mood appetitie increased and hopefully will see weight gain. If weight loss persists expand work up.  Weight loss - on review of recent labs done by hematologist no obvious cause found. But full work up still pending. On review decided to check A1c since mild sugar elevatoin in the past.   Screening for prostate cancer as part of work up for weigh loss - PSA   Follow up in one month or sooner if needed.

## 2022-09-25 NOTE — Progress Notes (Signed)
Subjective:    Patient ID: Nathaniel Williams, male    DOB: October 04, 1960, 62 y.o.   MRN: [id number]  HPI  Pt in for follow up.  Last visit he had lumbar back pain. Hx of neck pain.   Hx of chronic neck pain and chronic low back pain.   IMPRESSION: No recent fracture is seen in the lumbar spine. Degenerative changes are noted in lower lumbar spine with disc space narrowing and facet hypertrophy at L4-L5 and L5-S1 levels, more so at L5-S1 level.  Pt on norco for chronic neck. He states his low back pain is even 8/10 with use of norco.   Pt is not diabetic.    Pt weight recently 109 lb. June 5 weighed 112. Pt states he started to lose appetite. He states he has been stressed and some depressed over past  months. 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 He is still trying to get job. He states he got drug screen done. He is waiting on word for possible new job. Recent hiv screen negative. Up to date on colonosocpy. 2022 normal psa.  On review pt did weight 129 in April and now weighs 109.   Pt has been seen by hematologist. I see he had iga, igg, igm immunoglobulin studis and some electrophoresis.  Pt had prior smoking 1993-2022 but only one pack a week    Review of Systems See hpi.    Objective:   Physical Exam General Appearance- Not in acute distress.   Neck - no pain on palpatoin presently.   Chest and Lung Exam Auscultation: Breath sounds:-Normal. Clear even and unlabored. Adventitious sounds:- No Adventitious sounds.   Cardiovascular Auscultation:Rythm - Regular, rate and rythm. Heart Sounds -Normal heart sounds.   Abdomen Inspection:-Inspection Normal.  Palpation/Perucssion: Palpation and Percussion of the abdomen reveal- Non Tender, No Rebound tenderness, No rigidity(Guarding) and No Palpable abdominal masses.  Liver:-Normal.  Spleen:- Normal.    Back Mid lumbar spine tenderness to palpation.  No Pain on straight leg  lift. Pain on lateral movements and flexion/extension of the spine.   Lower ext neurologic   L5-S1 sensation intact bilaterally. Normal patellar reflexes bilaterally. No foot drop bilaterally.        Assessment & Plan:   Patient Instructions  Hypertension: Blood pressure well controlled. -Continue Lisinopril 40mg  daily,hydralazie 50 mg three times daily.  and Metoprolol 50mg  XL daily.   Neck pain  Chronic midline low back pain without sciatica Continue norco. Will add on 6 day taper medrol to see if get some relief. If not may need to consier pain management.   History of colon polyps With wt loss of 20 lb since April do want to have GI evaluate you for possible repeat colonoscopy or egd if indicated. - Ambulatory referral to Gastroenterology  Depression, unspecified depression type Rx paxil 10 mg daily. Rx advisement given. Will if if with better mood appetitie increased and hopefully will see weight gain. If weight loss persists expand work up.  Weight loss - on review of recent labs done by hematologist no obvious cause found. But full work up still pending. On review decided to check A1c since mild sugar elevatoin in the past.   Screening for prostate cancer as part of work up for weigh loss - PSA   Follow up in one month or sooner if needed.   Time spent with patient today was 41  minutes which consisted of chart revdew, discussing diagnosis,  work up, treatment and documentation.

## 2022-09-26 LAB — PSA: PSA: 1.6 ng/mL (ref 0.10–4.00)

## 2022-09-26 LAB — HEMOGLOBIN A1C: Hgb A1c MFr Bld: 5.3 % (ref 4.6–6.5)

## 2022-10-02 ENCOUNTER — Other Ambulatory Visit: Payer: Self-pay | Admitting: Medical

## 2022-10-02 ENCOUNTER — Telehealth: Payer: Self-pay | Admitting: Medical

## 2022-10-02 MED ORDER — HYDROCODONE-ACETAMINOPHEN 5-325 MG PO TABS
1.0000 | ORAL_TABLET | Freq: Three times a day (TID) | ORAL | 0 refills | Status: DC | PRN
  Filled 2022-10-02: qty 90, 30d supply, fill #0

## 2022-10-02 NOTE — Telephone Encounter (Signed)
Requesting: NORCO Contract:07/18/22 UDS:07/18/22 Last Visit:09/25/22 Next Visit:10/27/22 Last Refill:08/07/22  Please Advise

## 2022-10-02 NOTE — Telephone Encounter (Signed)
Rx norco sent to pharmacy. 

## 2022-10-02 NOTE — Addendum Note (Signed)
Addended by: Gwenevere Abbot on: 10/02/2022 09:30 PM   Modules accepted: Orders

## 2022-10-02 NOTE — Telephone Encounter (Signed)
 Opened to review 

## 2022-10-02 NOTE — Telephone Encounter (Signed)
Prescription Request  10/02/2022  Is this a "Controlled Substance" medicine? Yes  LOV: 09/25/2022  What is the name of the medication or equipment?   Rx #: [id number]  HYDROcodone-acetaminophen [facility]) 5-325 MG tablet [[id number]]   Rx #: [id number]  hydrALAZINE (APRESOLINE) 50 MG tablet [[id number]]   Have you contacted your pharmacy to request a refill? No   Which pharmacy would you like this sent to?  [facility] - [facility] [address], [address] [place] [address] Phone: [phone] Fax: [phone]    Patient notified that their request is being sent to the clinical staff for review and that they should receive a response within 2 business days.   Please advise at Mobile [phone] (mobile)

## 2022-10-02 NOTE — Addendum Note (Signed)
Addended by: Maximino Sarin on: 10/02/2022 03:51 PM   Modules accepted: Orders

## 2022-10-03 ENCOUNTER — Other Ambulatory Visit (HOSPITAL_BASED_OUTPATIENT_CLINIC_OR_DEPARTMENT_OTHER): Payer: Self-pay

## 2022-10-03 ENCOUNTER — Other Ambulatory Visit: Payer: Self-pay

## 2022-10-06 ENCOUNTER — Other Ambulatory Visit (HOSPITAL_BASED_OUTPATIENT_CLINIC_OR_DEPARTMENT_OTHER): Payer: Self-pay

## 2022-10-27 ENCOUNTER — Ambulatory Visit: Payer: Medicaid Other | Admitting: Medical

## 2022-10-27 ENCOUNTER — Telehealth: Payer: Self-pay | Admitting: Medical

## 2022-10-27 ENCOUNTER — Encounter: Payer: Self-pay | Admitting: Medical

## 2022-10-27 ENCOUNTER — Other Ambulatory Visit (HOSPITAL_BASED_OUTPATIENT_CLINIC_OR_DEPARTMENT_OTHER): Payer: Self-pay

## 2022-10-27 VITALS — BP 137/81 | HR 59 | Ht 69.0 in | Wt 106.4 lb

## 2022-10-27 DIAGNOSIS — K229 Disease of esophagus, unspecified: Secondary | ICD-10-CM | POA: Diagnosis not present

## 2022-10-27 DIAGNOSIS — Z86010 Personal history of colonic polyps: Secondary | ICD-10-CM | POA: Diagnosis not present

## 2022-10-27 DIAGNOSIS — M542 Cervicalgia: Secondary | ICD-10-CM | POA: Diagnosis not present

## 2022-10-27 DIAGNOSIS — F32A Depression, unspecified: Secondary | ICD-10-CM

## 2022-10-27 DIAGNOSIS — R634 Abnormal weight loss: Secondary | ICD-10-CM | POA: Diagnosis not present

## 2022-10-27 DIAGNOSIS — K259 Gastric ulcer, unspecified as acute or chronic, without hemorrhage or perforation: Secondary | ICD-10-CM

## 2022-10-27 DIAGNOSIS — D649 Anemia, unspecified: Secondary | ICD-10-CM

## 2022-10-27 DIAGNOSIS — I10 Essential (primary) hypertension: Secondary | ICD-10-CM

## 2022-10-27 LAB — CBC WITH DIFFERENTIAL/PLATELET
Absolute Monocytes: 392 cells/uL (ref 200–950)
Basophils Absolute: 29 cells/uL (ref 0–200)
Basophils Relative: 0.8 %
Eosinophils Absolute: 29 cells/uL (ref 15–500)
Eosinophils Relative: 0.8 %
HCT: 40 % (ref 38.5–50.0)
Hemoglobin: 13.1 g/dL — ABNORMAL LOW (ref 13.2–17.1)
Lymphs Abs: 1094 cells/uL (ref 850–3900)
MCH: 27.5 pg (ref 27.0–33.0)
MCHC: 32.8 g/dL (ref 32.0–36.0)
MCV: 83.9 fL (ref 80.0–100.0)
MPV: 10.6 fL (ref 7.5–12.5)
Monocytes Relative: 10.9 %
Neutro Abs: 2056 cells/uL (ref 1500–7800)
Neutrophils Relative %: 57.1 %
Platelets: 237 10*3/uL (ref 140–400)
RBC: 4.77 10*6/uL (ref 4.20–5.80)
RDW: 16.5 % — ABNORMAL HIGH (ref 11.0–15.0)
Total Lymphocyte: 30.4 %
WBC: 3.6 10*3/uL — ABNORMAL LOW (ref 3.8–10.8)

## 2022-10-27 MED ORDER — PAROXETINE HCL 10 MG PO TABS
10.0000 mg | ORAL_TABLET | Freq: Every day | ORAL | 11 refills | Status: DC
  Filled 2022-10-27: qty 30, 30d supply, fill #0
  Filled 2022-11-13 – 2022-11-18 (×2): qty 30, 30d supply, fill #1
  Filled 2022-12-22: qty 30, 30d supply, fill #2
  Filled 2023-02-12: qty 30, 30d supply, fill #3

## 2022-10-27 MED ORDER — PANTOPRAZOLE SODIUM 40 MG PO TBEC
40.0000 mg | DELAYED_RELEASE_TABLET | Freq: Every day | ORAL | 3 refills | Status: DC
  Filled 2022-10-27: qty 90, 90d supply, fill #0
  Filled 2023-02-12: qty 90, 90d supply, fill #1

## 2022-10-27 NOTE — Progress Notes (Signed)
Subjective:    Patient ID: Nathaniel Williams, male    DOB: 06-08-1960, 62 y.o.   MRN: [id number]  HPI Pt in for follow up.  Last visit AVS below in ".  "Hypertension: Blood pressure well controlled. -Continue Lisinopril 40mg  daily,hydralazie 50 mg three times daily.  and Metoprolol 50mg  XL daily.    Neck pain  Chronic midline low back pain without sciatica Continue norco. Will add on 6 day taper medrol to see if get some relief. If not may need to consier pain management.     History of colon polyps With wt loss of 20 lb since April do want to have GI evaluate you for possible repeat colonoscopy or egd if indicated. - Ambulatory referral to Gastroenterology   Depression, unspecified depression type Rx paxil 10 mg daily. Rx advisement given. Will if if with better mood appetitie increased and hopefully will see weight gain. If weight loss persists expand work up.   Weight loss - on review of recent labs done by hematologist no obvious cause found. But full work up still pending. On review decided to check A1c since mild sugar elevatoin in the past.    Screening for prostate cancer as part of work up for weigh loss - PSA "     Bp is still controlled with  Lisinopril 40mg  daily,hydralazie 50 mg three times daily.  and Metoprolol 50mg  XL daily.  Neck pain  Chronic midline low back pain without sciatica On norco. His pain is controlled. Last time had increased pain above baseline and medrol helped flare pain down. Up to date on uds and contract.  Weight loss has leveled off. As part of work up ordered psa which was normal. Referred to gi md also for review/colonoscopy. Specialist office states not due until 2028.   Staff states not due despite info given.  Depression. Pt states mood is better with paxil and appetitie is improving.      Review of Systems  Constitutional:  Negative for chills, fatigue and fever.  Respiratory:  Negative for choking, chest tightness,  shortness of breath and wheezing.   Cardiovascular:  Negative for chest pain and palpitations.  Gastrointestinal:  Negative for abdominal pain.  Genitourinary:  Negative for dysuria, flank pain and frequency.  Musculoskeletal:  Positive for back pain. Negative for joint swelling and neck pain.       See hpi.  Neurological:  Negative for dizziness, speech difficulty, weakness and light-headedness.  Hematological:  Negative for adenopathy. Does not bruise/bleed easily.  Psychiatric/Behavioral:  Negative for suicidal ideas.        Mood better. Appetitie better.    Past Medical History:  Diagnosis Date   Achalasia 1996   Dx in [place] s/p Heller Myotomy w/gastric pull-thru   Alcohol use    Aspiration pneumonia (HCC)    Cervical spondylosis    Gastric ulcer    Gastritis    GI bleed    H/O hydrocele    of scrotum   Hypertension    IDA (iron deficiency anemia)      Social History   Socioeconomic History   Marital status: Legally Separated    Spouse name: Not on file   Number of children: Not on file   Years of education: Not on file   Highest education level: Not on file  Occupational History   Not on file  Tobacco Use   Smoking status: Former    Current packs/day: 0.25    Average packs/day: 0.3 packs/day for  18.0 years (4.5 ttl pk-yrs)    Types: Cigarettes   Smokeless tobacco: Never  Vaping Use   Vaping status: Never Used  Substance and Sexual Activity   Alcohol use: Not Currently    Comment: 1.5 L rum every 10 days (per hospital record 10/21/20)   Drug use: Never   Sexual activity: Not on file  Other Topics Concern   Not on file  Social History Narrative   Not on file   Social Determinants of Health   Financial Resource Strain: Not on file  Food Insecurity: Not on file  Transportation Needs: Not on file  Physical Activity: Not on file  Stress: Not on file  Social Connections: Unknown (08/02/2021)   Received from [facility]   Social Network    Social  Network: Not on file  Intimate Partner Violence: Unknown (06/24/2021)   Received from [facility]   HITS    Physically Hurt: Not on file    Insult or Talk Down To: Not on file    Threaten Physical Harm: Not on file    Scream or Curse: Not on file    Past Surgical History:  Procedure Laterality Date   BIOPSY  10/21/2020   Procedure: BIOPSY;  Surgeon: Beverley Fiedler, MD;  Location: [facility] ENDOSCOPY;  Service: Gastroenterology;;   ESOPHAGOGASTRODUODENOSCOPY (EGD) WITH PROPOFOL N/A 10/21/2020   Procedure: ESOPHAGOGASTRODUODENOSCOPY (EGD) WITH PROPOFOL;  Surgeon: Beverley Fiedler, MD;  Location: [facility] ENDOSCOPY;  Service: Gastroenterology;  Laterality: N/A;   HELLER MYOTOMY  1996   in [place]; w/gastric pull thru   HEMOSTASIS CLIP PLACEMENT  10/21/2020   Procedure: HEMOSTASIS CLIP PLACEMENT;  Surgeon: Beverley Fiedler, MD;  Location: [facility] ENDOSCOPY;  Service: Gastroenterology;;    Family History  Problem Relation Age of Onset   Cervical cancer Mother    Alzheimer's disease Father    Stomach cancer Neg Hx    Esophageal cancer Neg Hx    Pancreatic cancer Neg Hx     No Known Allergies  Current Outpatient Medications on File Prior to Visit  Medication Sig Dispense Refill   Ascorbic Acid (VITAMIN C) 1000 MG tablet Take 1,000 mg by mouth daily.     B Complex-C (SUPER B COMPLEX PO) Take 1 tablet by mouth daily.     Camphor-Menthol-Methyl Sal (TIGER BALM MUSCLE RUB EX) Apply 1 application. topically as needed (back pain).     Cholecalciferol (VITAMIN D3) 50 MCG (2000 UT) capsule Take 2,000 Units by mouth daily.     clobetasol cream (TEMOVATE) 0.05 % Apply 1 Application topically daily.     gabapentin (NEURONTIN) 100 MG capsule Take 1 capsule (100 mg total) by mouth every evening. 90 capsule 1   hydrALAZINE (APRESOLINE) 50 MG tablet Take 1 tablet (50 mg total) by mouth 3 (three) times daily. 90 tablet 3   HYDROcodone-acetaminophen (NORCO) 5-325 MG tablet Take 1 tablet by mouth every 8 (eight) hours as  needed for severe pain. 90 tablet 0   lidocaine (LIDODERM) 5 % Place 1 patch onto the skin daily as needed (back pain). Remove & Discard patch within 12 hours or as directed by MD     lisinopril (ZESTRIL) 40 MG tablet Take 1 tablet (40 mg total) by mouth daily. 90 tablet 3   metoprolol succinate (TOPROL-XL) 50 MG 24 hr tablet Take 1 tablet (50 mg total) by mouth daily. TAKE WITH OR IMMEDIATELY FOLLOWING A MEAL. 30 tablet 11   metoprolol succinate (TOPROL-XL) 50 MG 24 hr tablet Take 1  tablet (50 mg total) by mouth daily. Take with or immediately following a meal. 90 tablet 3   Misc Natural Products (OSTEO BI-FLEX ADV TRIPLE ST PO) Take 1 tablet by mouth daily.     ondansetron (ZOFRAN-ODT) 4 MG disintegrating tablet Take 1 tablet (4 mg total) by mouth every 8 (eight) hours as needed. 20 tablet 0   OVER THE COUNTER MEDICATION Take 1 tablet by mouth daily. G[employer] MEGA MEN ENERGY AND METABOBLISM     polyvinyl alcohol (LIQUIFILM TEARS) 1.4 % ophthalmic solution Place 1 drop into both eyes as needed for dry eyes.     potassium chloride SA (KLOR-CON M) 20 MEQ tablet Take 2 tablets (40 mEq total) by mouth daily for 5 days. 10 tablet 0   senna-docusate (SENOKOT-S) 8.6-50 MG tablet Take 1 tablet by mouth at bedtime as needed for mild to moderate constipation. 100 tablet 1   Turmeric Curcumin 500 MG CAPS Take 1 capsule by mouth daily.     vitamin E 180 MG (400 UNITS) capsule Take 400 Units by mouth daily.     No current facility-administered medications on file prior to visit.    BP 137/81   Pulse (!) 59   Ht 5\' 9"  (1.753 m)   Wt 106 lb 6.4 oz (48.3 kg)   SpO2 96%   BMI 15.71 kg/m        Objective:   Physical Exam  General Mental Status- Alert. General Appearance- Not in acute distress.   Skin General: Color- Normal Color. Moisture- Normal Moisture.  Neck Carotid Arteries- Normal color. Moisture- Normal Moisture. No carotid bruits. No JVD.  Chest and Lung Exam Auscultation: Breath  Sounds:-Normal.  Cardiovascular Auscultation:Rythm- Regular. Murmurs & Other Heart Sounds:Auscultation of the heart reveals- No Murmurs.  Abdomen Inspection:-Inspeection Normal. Palpation/Percussion:Note:No mass. Palpation and Percussion of the abdomen reveal- Non Tender, Non Distended + BS, no rebound or guarding.   Neurologic Cranial Nerve exam:- CN III-XII intact(No nystagmus), symmetric smile. Strength:- 5/5 equal and symmetric strength both upper and lower extremities.       Assessment & Plan:   Patient Instructions  1. Gastric ulcer, unspecified chronicity, unspecified whether gastric ulcer hemorrhage or perforation present Hx of ulcer. Pt has seen gi in the past. Refilling protonix today. Symptoms controled. - pantoprazole (PROTONIX) 40 MG tablet; Take 1 tablet (40 mg total) by mouth daily.  Dispense: 90 tablet; Refill: 3  2. Irregular Z line of esophagus - pantoprazole (PROTONIX) 40 MG tablet; Take 1 tablet (40 mg total) by mouth daily.  Dispense: 90 tablet; Refill: 3  3. Weight loss Psa normal. Wt stabalized. As part of work up had wanted to make sure pt up to date on his colnoscopy in light of hx of colon polyps. 20 lb weight loss since April. Last colonoscopy in 2022. Hx of gi bleed in past. Weight loss unintentional. No black or blood stools. Placing referral to gi again. Asking referral coordinator to verify. - Ambulatory referral to Gastroenterology  4. History of colon polyps Same as above. - Ambulatory referral to Gastroenterology  5. Depression, unspecified depression type Mood improved. Refill paxil.  6. Hypertension, unspecified type Bp controlled on Lisinopril 40mg  daily,hydralazie 50 mg three times daily.  and Metoprolol 50mg  XL daily.  7. Neck pain Controlled with norco. Up to date on uds and contract.   Follow up  6 weeks  or sooner if needed.   Esperanza Richters, PA-C

## 2022-10-27 NOTE — Patient Instructions (Addendum)
1. Gastric ulcer, unspecified chronicity, unspecified whether gastric ulcer hemorrhage or perforation present(dx associated with refill).  Hx of ulcer. Pt has seen gi in the past. Refilling protonix today. Symptoms controled. Get cbc today - pantoprazole (PROTONIX) 40 MG tablet; Take 1 tablet (40 mg total) by mouth daily.  Dispense: 90 tablet; Refill: 3  2. Irregular Z line of esophagus - pantoprazole (PROTONIX) 40 MG tablet; Take 1 tablet (40 mg total) by mouth daily.  Dispense: 90 tablet; Refill: 3  3. Weight loss Psa normal. Wt stabilized. As part of work up had wanted to make sure pt up to date on his colonoscopy in light of hx of colon polyps. 20 lb weight loss since April. Last colonoscopy in 2022. Hx of gi bleed in past. Weight loss unintentional. No black or blood stools. Placing referral to gi again. Asking referral coordinator to verify. - Ambulatory referral to Gastroenterology  4. History of colon polyps Same as above. - Ambulatory referral to Gastroenterology  5. Depression, unspecified depression type Mood improved. Refill paxil.  6. Hypertension, unspecified type Bp controlled on Lisinopril 40mg  daily,hydralazine 50 mg three times daily.  and Metoprolol 50mg  XL daily.  7. Neck pain Controlled with norco. Up to date on uds and contract.   Follow up  6 weeks  or sooner if needed.

## 2022-10-27 NOTE — Addendum Note (Signed)
Addended by: Maximino Sarin on: 10/27/2022 02:08 PM   Modules accepted: Orders

## 2022-10-27 NOTE — Telephone Encounter (Signed)
Prescription Request  10/27/2022  Is this a "Controlled Substance" medicine? No  LOV: 10/27/2022  What is the name of the medication or equipment? methylPREDNISolone (MEDROL DOSEPAK) 4 MG TBPK tablet  Have you contacted your pharmacy to request a refill? No   Which pharmacy would you like this sent to?  [facility] [address] [place] [address] Phone: [phone] Fax: [phone]    Patient notified that their request is being sent to the clinical staff for review and that they should receive a response within 2 business days.   Please advise at [facility] [phone]

## 2022-11-02 ENCOUNTER — Other Ambulatory Visit (HOSPITAL_BASED_OUTPATIENT_CLINIC_OR_DEPARTMENT_OTHER): Payer: Self-pay

## 2022-11-13 ENCOUNTER — Other Ambulatory Visit: Payer: Self-pay | Admitting: Medical

## 2022-11-13 ENCOUNTER — Other Ambulatory Visit (HOSPITAL_BASED_OUTPATIENT_CLINIC_OR_DEPARTMENT_OTHER): Payer: Self-pay

## 2022-11-13 NOTE — Telephone Encounter (Signed)
Prescription Request  11/13/2022  Is this a "Controlled Substance" medicine? No  LOV: 10/27/2022  What is the name of the medication or equipment? hydrALAZINE (APRESOLINE) 50 MG tablet   HYDROcodone-acetaminophen (NORCO) 5-325 MG tablet  methylPREDNISolone (MEDROL DOSEPAK) 4 MG TBPK tablet  Have you contacted your pharmacy to request a refill? No   Which pharmacy would you like this sent to?  [facility] [address], [address] [place] [address] Phone: [phone] Fax: [phone]    Patient notified that their request is being sent to the clinical staff for review and that they should receive a response within 2 business days.   Please advise at [facility] [phone]

## 2022-11-13 NOTE — Addendum Note (Signed)
Addended by: Maximino Sarin on: 11/13/2022 11:42 AM   Modules accepted: Orders

## 2022-11-13 NOTE — Telephone Encounter (Signed)
Requesting: Norco 5-325 Contract: 07/06/2022 UDS: 03/27/2022 Last Visit: 10/27/2022 Next Visit: 12/08/2022 Last Refill: 10/02/2022  Please Advise

## 2022-11-13 NOTE — Telephone Encounter (Signed)
/  Requesting: NORCO Contract:07/18/22 UDS:03/27/22 Last Visit:10/27/22 Next Visit:12/08/22 Last Refill:10/02/22  Please Advise

## 2022-11-14 ENCOUNTER — Other Ambulatory Visit (HOSPITAL_BASED_OUTPATIENT_CLINIC_OR_DEPARTMENT_OTHER): Payer: Self-pay

## 2022-11-14 MED ORDER — HYDRALAZINE HCL 50 MG PO TABS
50.0000 mg | ORAL_TABLET | Freq: Three times a day (TID) | ORAL | 3 refills | Status: DC
  Filled 2022-11-21: qty 90, 30d supply, fill #0
  Filled 2023-01-08: qty 90, 30d supply, fill #1

## 2022-11-14 MED ORDER — HYDROCODONE-ACETAMINOPHEN 5-325 MG PO TABS
1.0000 | ORAL_TABLET | Freq: Three times a day (TID) | ORAL | 0 refills | Status: DC | PRN
  Filled 2022-11-14: qty 90, 30d supply, fill #0

## 2022-11-14 NOTE — Telephone Encounter (Signed)
Duplicate rx. Just sent norco

## 2022-11-14 NOTE — Addendum Note (Signed)
Addended by: Gwenevere Abbot on: 11/14/2022 12:50 PM   Modules accepted: Orders

## 2022-11-14 NOTE — Telephone Encounter (Signed)
Rx refill sent to pharmacy. 

## 2022-11-15 ENCOUNTER — Other Ambulatory Visit (HOSPITAL_BASED_OUTPATIENT_CLINIC_OR_DEPARTMENT_OTHER): Payer: Self-pay

## 2022-11-17 ENCOUNTER — Other Ambulatory Visit (HOSPITAL_BASED_OUTPATIENT_CLINIC_OR_DEPARTMENT_OTHER): Payer: Self-pay

## 2022-11-21 ENCOUNTER — Other Ambulatory Visit (HOSPITAL_BASED_OUTPATIENT_CLINIC_OR_DEPARTMENT_OTHER): Payer: Self-pay

## 2022-11-21 ENCOUNTER — Telehealth: Payer: Self-pay

## 2022-11-21 NOTE — Telephone Encounter (Signed)
Pt called stating that he did not get his methylprednisolone when he went to the pharmacy. After reviewing chart, noted that a message was sent back but no script was sent in. Advised a note would be sent back to look into this and we would call him with an update.

## 2022-11-21 NOTE — Telephone Encounter (Signed)
Pt wants a refill of methylprednisolone

## 2022-11-21 NOTE — Telephone Encounter (Signed)
Pt notified and stated it can be discussed at his appt on 12/08/22

## 2022-12-08 ENCOUNTER — Ambulatory Visit (INDEPENDENT_AMBULATORY_CARE_PROVIDER_SITE_OTHER): Payer: Medicaid Other | Admitting: Medical

## 2022-12-08 ENCOUNTER — Encounter: Payer: Self-pay | Admitting: Medical

## 2022-12-08 VITALS — BP 135/75 | HR 65 | Temp 98.0°F | Resp 16 | Ht 69.0 in | Wt 109.0 lb

## 2022-12-08 DIAGNOSIS — D649 Anemia, unspecified: Secondary | ICD-10-CM

## 2022-12-08 DIAGNOSIS — K219 Gastro-esophageal reflux disease without esophagitis: Secondary | ICD-10-CM | POA: Diagnosis not present

## 2022-12-08 DIAGNOSIS — I10 Essential (primary) hypertension: Secondary | ICD-10-CM | POA: Diagnosis not present

## 2022-12-08 DIAGNOSIS — F32A Depression, unspecified: Secondary | ICD-10-CM

## 2022-12-08 DIAGNOSIS — M542 Cervicalgia: Secondary | ICD-10-CM

## 2022-12-08 NOTE — Patient Instructions (Addendum)
1. Hypertension, unspecified type -bp well controlled with hydarlazine, toprol and lisinopril  2. Gastroesophageal reflux disease, unspecified whether esophagitis present -continue protonix and follow up with gi MD end of October.  3. Anemia, unspecified type -continue with iron infusions and follow up with hematologist for further work up evaluation per their request.  4. Neck pain  -continue norco. Up to date on contract and uds. Upcoming epidural injections as well.  5. Depression improved with paxil  Glad to see weight gain since last visit. You mentioned in past medrol helped appetitite. Not recommend to use medrol long term. But you can try zyrtec over the counter. Med for allergy but can in some cases increase appetitie,  Follow up early January 2025 before the 8th. Will be due for uds and contract at that time.

## 2022-12-08 NOTE — Progress Notes (Signed)
Subjective:    Patient ID: Nathaniel Williams, male    DOB: 07-13-1960, 62 y.o.   MRN: [id number]  HPI  Pt in for follow up.  Pt will get epidural injection  for neck pain scheduled for Oct,.  Also on norco for pain. Up to date on contract.  Has follow up on Dec 28, 2022 with hematologist.  "Assessment and Plan:  1. Iron deficiency anemia: He has undergone a colonoscopy but not an EGD. Swallowing is better. I think this is something that we can monitor but if his swallowing becomes an issue again he needs an EGD. He has been taking an over-the-counter iron supplement for the past 2 weeks but has not really tolerated this with anorexia and abdominal pain. Therefore, I recommend a therapeutic course of IV iron. We reviewed the risk, benefits and alternatives, and he stated he would be agreeable to undergo parenteral iron replacement. I have sent a message to Raynelle Fanning so that we can get insurance authorization on a formulary product. I would recommend either Feraheme 510 mg IV weekly x 2 versus iron sucrose 300/300/400 versus p.o. at 250 mg IV weekly"    Appointment with GI on January 17, 2023. Hx of  Gastric ulcer, unspecified chronicity, unspecified whether gastric ulcer hemorrhage or perforation present(dx associated with refill).  Hx of ulcer. Pt has seen gi in the past. Refilling protonix today. Symptoms controled. Get cbc today - pantoprazole (PROTONIX) 40 MG tablet; Take 1 tablet (40 mg total) by mouth daily   Depression- on last visit rx'd paxil. He fills mood is better with paxil. States mood very close to normal as before.   Review of Systems  Constitutional:  Negative for chills, fatigue and fever.  Respiratory:  Negative for chest tightness, shortness of breath and wheezing.   Cardiovascular:  Negative for chest pain and palpitations.  Gastrointestinal:  Negative for abdominal pain, diarrhea and vomiting.  Genitourinary:  Negative for dysuria and frequency.  Musculoskeletal:   Positive for neck pain. Negative for back pain and joint swelling.  Skin:  Negative for rash.  Neurological:  Negative for dizziness.  Psychiatric/Behavioral:  Negative for behavioral problems. The patient is not nervous/anxious.     Past Medical History:  Diagnosis Date   Achalasia 1996   Dx in [place] s/p Heller Myotomy w/gastric pull-thru   Alcohol use    Aspiration pneumonia (HCC)    Cervical spondylosis    Gastric ulcer    Gastritis    GI bleed    H/O hydrocele    of scrotum   Hypertension    IDA (iron deficiency anemia)      Social History   Socioeconomic History   Marital status: Legally Separated    Spouse name: Not on file   Number of children: Not on file   Years of education: Not on file   Highest education level: Not on file  Occupational History   Not on file  Tobacco Use   Smoking status: Former    Current packs/day: 0.25    Average packs/day: 0.3 packs/day for 18.0 years (4.5 ttl pk-yrs)    Types: Cigarettes   Smokeless tobacco: Never  Vaping Use   Vaping status: Never Used  Substance and Sexual Activity   Alcohol use: Not Currently    Comment: 1.5 L rum every 10 days (per hospital record 10/21/20)   Drug use: Never   Sexual activity: Not on file  Other Topics Concern   Not on file  Social History  Narrative   Not on file   Social Determinants of Health   Financial Resource Strain: Not on file  Food Insecurity: Not on file  Transportation Needs: Not on file  Physical Activity: Not on file  Stress: Not on file  Social Connections: Unknown (08/02/2021)   Received from [facility]   Social Network    Social Network: Not on file  Intimate Partner Violence: Unknown (06/24/2021)   Received from [facility]   HITS    Physically Hurt: Not on file    Insult or Talk Down To: Not on file    Threaten Physical Harm: Not on file    Scream or Curse: Not on file    Past Surgical History:  Procedure Laterality Date    BIOPSY  10/21/2020   Procedure: BIOPSY;  Surgeon: Beverley Fiedler, MD;  Location: [facility] ENDOSCOPY;  Service: Gastroenterology;;   ESOPHAGOGASTRODUODENOSCOPY (EGD) WITH PROPOFOL N/A 10/21/2020   Procedure: ESOPHAGOGASTRODUODENOSCOPY (EGD) WITH PROPOFOL;  Surgeon: Beverley Fiedler, MD;  Location: [facility] ENDOSCOPY;  Service: Gastroenterology;  Laterality: N/A;   HELLER MYOTOMY  1996   in [place]; w/gastric pull thru   HEMOSTASIS CLIP PLACEMENT  10/21/2020   Procedure: HEMOSTASIS CLIP PLACEMENT;  Surgeon: Beverley Fiedler, MD;  Location: [facility] ENDOSCOPY;  Service: Gastroenterology;;    Family History  Problem Relation Age of Onset   Cervical cancer Mother    Alzheimer's disease Father    Stomach cancer Neg Hx    Esophageal cancer Neg Hx    Pancreatic cancer Neg Hx     No Known Allergies  Current Outpatient Medications on File Prior to Visit  Medication Sig Dispense Refill   Ascorbic Acid (VITAMIN C) 1000 MG tablet Take 1,000 mg by mouth daily.     B Complex-C (SUPER B COMPLEX PO) Take 1 tablet by mouth daily.     Camphor-Menthol-Methyl Sal (TIGER BALM MUSCLE RUB EX) Apply 1 application. topically as needed (back pain).     Cholecalciferol (VITAMIN D3) 50 MCG (2000 UT) capsule Take 2,000 Units by mouth daily.     clobetasol cream (TEMOVATE) 0.05 % Apply 1 Application topically daily.     gabapentin (NEURONTIN) 100 MG capsule Take 1 capsule (100 mg total) by mouth every evening. 90 capsule 1   hydrALAZINE (APRESOLINE) 50 MG tablet Take 1 tablet (50 mg total) by mouth 3 (three) times daily. 90 tablet 3   HYDROcodone-acetaminophen (NORCO) 5-325 MG tablet Take 1 tablet by mouth every 8 (eight) hours as needed for severe pain. 90 tablet 0   lidocaine (LIDODERM) 5 % Place 1 patch onto the skin daily as needed (back pain). Remove & Discard patch within 12 hours or as directed by MD     lisinopril (ZESTRIL) 40 MG tablet Take 1 tablet (40 mg total) by mouth daily. 90 tablet 3   metoprolol succinate (TOPROL-XL) 50  MG 24 hr tablet Take 1 tablet (50 mg total) by mouth daily. Take with or immediately following a meal. 90 tablet 3   Misc Natural Products (OSTEO BI-FLEX ADV TRIPLE ST PO) Take 1 tablet by mouth daily.     ondansetron (ZOFRAN-ODT) 4 MG disintegrating tablet Take 1 tablet (4 mg total) by mouth every 8 (eight) hours as needed. 20 tablet 0   OVER THE COUNTER MEDICATION Take 1 tablet by mouth daily. [employer] MEGA MEN ENERGY AND METABOBLISM     pantoprazole (PROTONIX) 40 MG tablet Take 1 tablet (40 mg total) by mouth daily. 90  tablet 3   PARoxetine (PAXIL) 10 MG tablet Take 1 tablet (10 mg total) by mouth daily. 30 tablet 11   polyvinyl alcohol (LIQUIFILM TEARS) 1.4 % ophthalmic solution Place 1 drop into both eyes as needed for dry eyes.     senna-docusate (SENOKOT-S) 8.6-50 MG tablet Take 1 tablet by mouth at bedtime as needed for mild to moderate constipation. 100 tablet 1   Turmeric Curcumin 500 MG CAPS Take 1 capsule by mouth daily.     vitamin E 180 MG (400 UNITS) capsule Take 400 Units by mouth daily.     metoprolol succinate (TOPROL-XL) 50 MG 24 hr tablet Take 1 tablet (50 mg total) by mouth daily. TAKE WITH OR IMMEDIATELY FOLLOWING A MEAL. 30 tablet 11   potassium chloride SA (KLOR-CON M) 20 MEQ tablet Take 2 tablets (40 mEq total) by mouth daily for 5 days. 10 tablet 0   No current facility-administered medications on file prior to visit.    BP 135/75 (BP Location: Left Arm, Patient Position: Sitting, Cuff Size: Normal)   Pulse 65   Temp 98 F (36.7 C) (Oral)   Resp 16   Ht 5\' 9"  (1.753 m)   Wt 109 lb (49.4 kg)   SpO2 98%   BMI 16.10 kg/m        Objective:   Physical Exam  General Mental Status- Alert. General Appearance- Not in acute distress.    Skin General: Color- Normal Color. Moisture- Normal Moisture.   Neck Carotid Arteries- Normal color. Moisture- Normal Moisture. No carotid bruits. No JVD.   Chest and Lung Exam Auscultation: Breath Sounds:-Normal.    Cardiovascular Auscultation:Rythm- Regular. Murmurs & Other Heart Sounds:Auscultation of the heart reveals- No Murmurs.   Abdomen Inspection:-Inspeection Normal. Palpation/Percussion:Note:No mass. Palpation and Percussion of the abdomen reveal- Non Tender, Non Distended + BS, no rebound or guarding.     Neurologic Cranial Nerve exam:- CN III-XII intact(No nystagmus), symmetric smile. Strength:- 5/5 equal and symmetric strength both upper and lower extremities.       Assessment & Plan:   Patient Instructions  1. Hypertension, unspecified type -bp well controlled with hydarlazine, toprol and lisinopril  2. Gastroesophageal reflux disease, unspecified whether esophagitis present -continue protonix and follow up with gi MD end of October.  3. Anemia, unspecified type -continue with iron infusions and follow up with hematologist for further work up evaluation per their request.  4. Neck pain  -continue norco. Up to date on contract and uds. Upcoming epidural injections as well.  5. Depression improved with paxil  Glad to see weight gain since last visit. You mentioned in past medrol helped appetitite. Not recommend to use medrol long term. But you can try zyrtec over the counter. Med for allergy but can in some cases increase appetitie,  Follow up early January 2025 before the 8th. Will be due for uds and contract at that time.       Esperanza Richters, PA-C

## 2022-12-22 ENCOUNTER — Other Ambulatory Visit (HOSPITAL_BASED_OUTPATIENT_CLINIC_OR_DEPARTMENT_OTHER): Payer: Self-pay

## 2022-12-26 ENCOUNTER — Other Ambulatory Visit (HOSPITAL_BASED_OUTPATIENT_CLINIC_OR_DEPARTMENT_OTHER): Payer: Self-pay

## 2023-01-01 ENCOUNTER — Telehealth: Payer: Self-pay | Admitting: Medical

## 2023-01-01 NOTE — Telephone Encounter (Signed)
Prescription Request  01/01/2023  Is this a "Controlled Substance" medicine? Yes  LOV: 12/08/2022  What is the name of the medication or equipment?   Rx #: [id number]  HYDROcodone-acetaminophen (NORCO) 5-325 MG tablet [[id number]]    Have you contacted your pharmacy to request a refill? No   Which pharmacy would you like this sent to?  [facility] [address], [address] [place] [address] Phone: [phone] Fax: [phone]    Patient notified that their request is being sent to the clinical staff for review and that they should receive a response within 2 business days.   Please advise at Mobile [phone] (mobile)

## 2023-01-02 ENCOUNTER — Other Ambulatory Visit (HOSPITAL_BASED_OUTPATIENT_CLINIC_OR_DEPARTMENT_OTHER): Payer: Self-pay

## 2023-01-02 MED ORDER — HYDROCODONE-ACETAMINOPHEN 5-325 MG PO TABS
1.0000 | ORAL_TABLET | Freq: Three times a day (TID) | ORAL | 0 refills | Status: DC | PRN
  Filled 2023-01-02: qty 90, 30d supply, fill #0

## 2023-01-02 NOTE — Addendum Note (Signed)
Addended by: Gwenevere Abbot on: 01/02/2023 08:47 AM   Modules accepted: Orders

## 2023-01-08 ENCOUNTER — Other Ambulatory Visit (HOSPITAL_BASED_OUTPATIENT_CLINIC_OR_DEPARTMENT_OTHER): Payer: Self-pay

## 2023-01-17 ENCOUNTER — Ambulatory Visit: Payer: Medicaid Other | Admitting: Nurse Practitioner

## 2023-02-12 ENCOUNTER — Other Ambulatory Visit (HOSPITAL_BASED_OUTPATIENT_CLINIC_OR_DEPARTMENT_OTHER): Payer: Self-pay

## 2023-02-12 ENCOUNTER — Other Ambulatory Visit: Payer: Self-pay

## 2023-02-22 ENCOUNTER — Other Ambulatory Visit (HOSPITAL_BASED_OUTPATIENT_CLINIC_OR_DEPARTMENT_OTHER): Payer: Self-pay

## 2023-03-23 ENCOUNTER — Ambulatory Visit: Payer: Medicaid Other | Admitting: Medical

## 2023-12-09 ENCOUNTER — Emergency Department (HOSPITAL_BASED_OUTPATIENT_CLINIC_OR_DEPARTMENT_OTHER)

## 2023-12-09 ENCOUNTER — Inpatient Hospital Stay (HOSPITAL_BASED_OUTPATIENT_CLINIC_OR_DEPARTMENT_OTHER)
Admission: EM | Admit: 2023-12-09 | Discharge: 2023-12-12 | DRG: 811 | Disposition: A | Discharge status: Home / Self Care | Attending: Internal Medicine | Admitting: Internal Medicine

## 2023-12-09 ENCOUNTER — Other Ambulatory Visit: Payer: Self-pay

## 2023-12-09 DIAGNOSIS — Z635 Disruption of family by separation and divorce: Secondary | ICD-10-CM

## 2023-12-09 DIAGNOSIS — K2211 Ulcer of esophagus with bleeding: Secondary | ICD-10-CM | POA: Diagnosis present

## 2023-12-09 DIAGNOSIS — K2970 Gastritis, unspecified, without bleeding: Secondary | ICD-10-CM | POA: Diagnosis present

## 2023-12-09 DIAGNOSIS — K254 Chronic or unspecified gastric ulcer with hemorrhage: Secondary | ICD-10-CM | POA: Diagnosis present

## 2023-12-09 DIAGNOSIS — K922 Gastrointestinal hemorrhage, unspecified: Secondary | ICD-10-CM

## 2023-12-09 DIAGNOSIS — D649 Anemia, unspecified: Principal | ICD-10-CM | POA: Diagnosis present

## 2023-12-09 DIAGNOSIS — R000 Tachycardia, unspecified: Secondary | ICD-10-CM | POA: Diagnosis present

## 2023-12-09 DIAGNOSIS — D62 Acute posthemorrhagic anemia: Secondary | ICD-10-CM | POA: Diagnosis present

## 2023-12-09 DIAGNOSIS — K264 Chronic or unspecified duodenal ulcer with hemorrhage: Secondary | ICD-10-CM | POA: Diagnosis present

## 2023-12-09 DIAGNOSIS — K222 Esophageal obstruction: Secondary | ICD-10-CM | POA: Diagnosis present

## 2023-12-09 DIAGNOSIS — K219 Gastro-esophageal reflux disease without esophagitis: Secondary | ICD-10-CM | POA: Diagnosis present

## 2023-12-09 DIAGNOSIS — I959 Hypotension, unspecified: Secondary | ICD-10-CM | POA: Diagnosis present

## 2023-12-09 DIAGNOSIS — Z79899 Other long term (current) drug therapy: Secondary | ICD-10-CM | POA: Diagnosis not present

## 2023-12-09 DIAGNOSIS — Z87891 Personal history of nicotine dependence: Secondary | ICD-10-CM | POA: Diagnosis not present

## 2023-12-09 DIAGNOSIS — R42 Dizziness and giddiness: Secondary | ICD-10-CM | POA: Diagnosis not present

## 2023-12-09 DIAGNOSIS — K229 Disease of esophagus, unspecified: Secondary | ICD-10-CM

## 2023-12-09 DIAGNOSIS — K921 Melena: Secondary | ICD-10-CM | POA: Diagnosis not present

## 2023-12-09 DIAGNOSIS — R9431 Abnormal electrocardiogram [ECG] [EKG]: Secondary | ICD-10-CM | POA: Diagnosis present

## 2023-12-09 DIAGNOSIS — R944 Abnormal results of kidney function studies: Secondary | ICD-10-CM | POA: Diagnosis present

## 2023-12-09 DIAGNOSIS — I351 Nonrheumatic aortic (valve) insufficiency: Secondary | ICD-10-CM | POA: Diagnosis not present

## 2023-12-09 DIAGNOSIS — K269 Duodenal ulcer, unspecified as acute or chronic, without hemorrhage or perforation: Secondary | ICD-10-CM | POA: Diagnosis not present

## 2023-12-09 DIAGNOSIS — K259 Gastric ulcer, unspecified as acute or chronic, without hemorrhage or perforation: Secondary | ICD-10-CM

## 2023-12-09 DIAGNOSIS — Z860101 Personal history of adenomatous and serrated colon polyps: Secondary | ICD-10-CM | POA: Diagnosis not present

## 2023-12-09 DIAGNOSIS — S2241XA Multiple fractures of ribs, right side, initial encounter for closed fracture: Secondary | ICD-10-CM | POA: Diagnosis present

## 2023-12-09 DIAGNOSIS — K2210 Ulcer of esophagus without bleeding: Secondary | ICD-10-CM

## 2023-12-09 DIAGNOSIS — W1839XA Other fall on same level, initial encounter: Secondary | ICD-10-CM | POA: Diagnosis present

## 2023-12-09 DIAGNOSIS — F1010 Alcohol abuse, uncomplicated: Secondary | ICD-10-CM | POA: Diagnosis present

## 2023-12-09 DIAGNOSIS — Z860100 Personal history of colon polyps, unspecified: Secondary | ICD-10-CM | POA: Diagnosis not present

## 2023-12-09 DIAGNOSIS — I10 Essential (primary) hypertension: Secondary | ICD-10-CM | POA: Diagnosis present

## 2023-12-09 DIAGNOSIS — K289 Gastrojejunal ulcer, unspecified as acute or chronic, without hemorrhage or perforation: Secondary | ICD-10-CM | POA: Diagnosis not present

## 2023-12-09 DIAGNOSIS — Y92009 Unspecified place in unspecified non-institutional (private) residence as the place of occurrence of the external cause: Secondary | ICD-10-CM | POA: Diagnosis not present

## 2023-12-09 DIAGNOSIS — F32A Depression, unspecified: Secondary | ICD-10-CM | POA: Diagnosis present

## 2023-12-09 DIAGNOSIS — F1090 Alcohol use, unspecified, uncomplicated: Secondary | ICD-10-CM | POA: Diagnosis not present

## 2023-12-09 DIAGNOSIS — F17210 Nicotine dependence, cigarettes, uncomplicated: Secondary | ICD-10-CM | POA: Diagnosis present

## 2023-12-09 LAB — LACTIC ACID, PLASMA
Lactic Acid, Venous: 2.1 mmol/L (ref 0.5–1.9)
Lactic Acid, Venous: 2.2 mmol/L (ref 0.5–1.9)

## 2023-12-09 LAB — COMPREHENSIVE METABOLIC PANEL WITH GFR
ALT: 31 U/L (ref 0–44)
AST: 48 U/L — ABNORMAL HIGH (ref 15–41)
Albumin: 3.8 g/dL (ref 3.5–5.0)
Alkaline Phosphatase: 46 U/L (ref 38–126)
Anion gap: 11 (ref 5–15)
BUN: 36 mg/dL — ABNORMAL HIGH (ref 8–23)
CO2: 23 mmol/L (ref 22–32)
Calcium: 8.5 mg/dL — ABNORMAL LOW (ref 8.9–10.3)
Chloride: 102 mmol/L (ref 98–111)
Creatinine, Ser: 0.78 mg/dL (ref 0.61–1.24)
GFR, Estimated: >60 mL/min (ref >60)
Glucose, Bld: 131 mg/dL — ABNORMAL HIGH (ref 70–99)
Potassium: 4 mmol/L (ref 3.5–5.1)
Sodium: 136 mmol/L (ref 135–145)
Total Bilirubin: 0.3 mg/dL (ref 0.0–1.2)
Total Protein: 5.8 g/dL — ABNORMAL LOW (ref 6.5–8.1)

## 2023-12-09 LAB — CBC
HCT: 21.5 % — ABNORMAL LOW (ref 39.0–52.0)
Hemoglobin: 7.3 g/dL — ABNORMAL LOW (ref 13.0–17.0)
MCH: 30.8 pg (ref 26.0–34.0)
MCHC: 34 g/dL (ref 30.0–36.0)
MCV: 90.7 fL (ref 80.0–100.0)
Platelets: 179 K/uL (ref 150–400)
RBC: 2.37 MIL/uL — ABNORMAL LOW (ref 4.22–5.81)
RDW: 12.6 % (ref 11.5–15.5)
WBC: 9.5 K/uL (ref 4.0–10.5)
nRBC: 0 % (ref 0.0–0.2)

## 2023-12-09 LAB — URINALYSIS, ROUTINE W REFLEX MICROSCOPIC
Bilirubin Urine: NEGATIVE
Glucose, UA: NEGATIVE mg/dL
Hgb urine dipstick: NEGATIVE
Ketones, ur: NEGATIVE mg/dL
Leukocytes,Ua: NEGATIVE
Nitrite: NEGATIVE
Protein, ur: NEGATIVE mg/dL
Specific Gravity, Urine: 1.015 (ref 1.005–1.030)
pH: 6 (ref 5.0–8.0)

## 2023-12-09 LAB — RESP PANEL BY RT-PCR (RSV, FLU A&B, COVID)  RVPGX2
Influenza A by PCR: NEGATIVE
Influenza B by PCR: NEGATIVE
Resp Syncytial Virus by PCR: NEGATIVE
SARS Coronavirus 2 by RT PCR: NEGATIVE

## 2023-12-09 LAB — LIPASE, BLOOD: Lipase: 21 U/L (ref 11–51)

## 2023-12-09 LAB — OCCULT BLOOD X 1 CARD TO LAB, STOOL: Fecal Occult Bld: POSITIVE — AB

## 2023-12-09 LAB — TROPONIN T, HIGH SENSITIVITY
Troponin T High Sensitivity: 16 ng/L (ref 0–19)
Troponin T High Sensitivity: 16 ng/L (ref 0–19)

## 2023-12-09 LAB — ETHANOL: Alcohol, Ethyl (B): <15 mg/dL (ref <15)

## 2023-12-09 MED ORDER — LORAZEPAM 1 MG PO TABS: 0.0000 mg | ORAL_TABLET | Freq: Two times a day (BID) | ORAL | Status: DC

## 2023-12-09 MED ORDER — SODIUM CHLORIDE 0.9 % IV SOLN: INTRAVENOUS | Status: DC

## 2023-12-09 MED ORDER — LORAZEPAM 2 MG/ML IJ SOLN
0.0000 mg | Freq: Four times a day (QID) | INTRAMUSCULAR | Status: AC
  Administered 2023-12-09: 2 mg via INTRAVENOUS
  Filled 2023-12-09: qty 1

## 2023-12-09 MED ORDER — THIAMINE MONONITRATE 100 MG PO TABS
100.0000 mg | ORAL_TABLET | Freq: Every day | ORAL | Status: DC
  Administered 2023-12-11 – 2023-12-12 (×2): 100 mg via ORAL
  Filled 2023-12-09 (×3): qty 1

## 2023-12-09 MED ORDER — IOHEXOL 300 MG/ML  SOLN
100.0000 mL | Freq: Once | INTRAMUSCULAR | Status: AC | PRN
  Administered 2023-12-09: 100 mL via INTRAVENOUS

## 2023-12-09 MED ORDER — THIAMINE HCL 100 MG/ML IJ SOLN
100.0000 mg | Freq: Every day | INTRAMUSCULAR | Status: DC
  Administered 2023-12-09: 100 mg via INTRAVENOUS
  Filled 2023-12-09: qty 2

## 2023-12-09 MED ORDER — PANTOPRAZOLE SODIUM 40 MG IV SOLR
40.0000 mg | Freq: Two times a day (BID) | INTRAVENOUS | Status: DC
Start: 2023-12-09 — End: 2023-12-12
  Administered 2023-12-09 – 2023-12-12 (×6): 40 mg via INTRAVENOUS
  Filled 2023-12-09 (×6): qty 10

## 2023-12-09 MED ORDER — SODIUM CHLORIDE 0.9 % IV BOLUS
1000.0000 mL | Freq: Once | INTRAVENOUS | Status: AC
  Administered 2023-12-09: 1000 mL via INTRAVENOUS

## 2023-12-09 MED ORDER — LORAZEPAM 1 MG PO TABS
0.0000 mg | ORAL_TABLET | Freq: Four times a day (QID) | ORAL | Status: AC
  Administered 2023-12-11: 1 mg via ORAL
  Filled 2023-12-09: qty 1

## 2023-12-09 MED ORDER — LORAZEPAM 2 MG/ML IJ SOLN: 0.0000 mg | Freq: Two times a day (BID) | INTRAMUSCULAR | Status: DC

## 2023-12-09 NOTE — ED Notes (Signed)
 Patient transported to CT

## 2023-12-09 NOTE — ED Notes (Signed)
 EDP and Primary RN notified of lactic acid 2.2.

## 2023-12-09 NOTE — H&P (Signed)
 History and Physical    Nathaniel Williams FMW:[id number] DOB: November 27, 1960 DOA: 12/09/2023  PCP: Dorina Loving, PA-C  Patient coming from: home  I have personally briefly reviewed patient's old medical records in [facility]  Chief Complaint: presents with dizziness/ chills x 3-4 days  HPI: Nathaniel Williams is a 63 y.o. 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 Patient also notes black stools around the safe timeframe.  He however denies any associated abdominal pain.Patient also noted due to dizziness he had fall the am of presentation and sustained injury the right-side of his ribs. Patient now has complaint of rib pain s/p fall this am. He does admit to ETOH abuse and notes he drinks 4 shots per day. He notes he is trying to cut down. He states he has not has history of withdrawal in the past when abstaining from alcohol. He notes is last drink was 4 days ago.  ED Course:  99.1, BP 99/55, hr 103, rr 19 sat 97% RVP-neg  FOB :+  EKG: nsr , PAC, LVH , twave inversion in diffuse more prominent in lateral leads.  CXR IMPRESSION: 1. No acute cardiopulmonary disease. 2. Postsurgical change compatible with patient's esophagectomy/gastric pull-through procedure.  Labs: Wbc 9.5, hgb 7.3 ( 13.1 ),-6.1, plt 179 Lipase 21 Na 136, K 4, CL 102, glu 131, cr 0.78,  AST 48 UA:neg  Lactic 2.1 CTH/cervical spine: IMPRESSION: 1. No acute brain injury. 2. Minimal chronic ischemic microvascular disease. 3. No acute cervical spine injury. 4. Mild spondylosis throughout the cervical spine with multilevel disc disease and neural foraminal narrowing as described. 5. Several small scattered lucent lesions throughout the vertebral bodies of the visualized cervicothoracic spine. These are nonspecific and may be  due to osteopenia, however metastatic disease or multiple myeloma is possible. 6. Air collection over the right neck base lateral to the trachea at the T1-T3 level with suggestion of surgical sutures along the inferolateral periphery as this is likely postsurgical versus lung herniation. Recommend correlation with clinical history.  CT chest  IMPRESSION: 1. Acute right posterior 11th and 12th rib fractures, as above. No pneumothorax. 2. Status post esophagectomy with gastric pull through. 3. Additional stable ancillary findings as above.  TX NS 1L, ativan , thiamine ,protonix   Review of Systems: As per HPI otherwise 10 point review of systems negative.   Past Medical History:  Diagnosis Date   Achalasia 1996   Dx in [place] s/p Heller Myotomy w/gastric pull-thru   Alcohol use    Aspiration pneumonia (HCC)    Cervical spondylosis    Gastric ulcer    Gastritis    GI bleed    H/O hydrocele    of scrotum   Hypertension    IDA (iron deficiency anemia)     Past Surgical History:  Procedure Laterality Date   BIOPSY  10/21/2020   Procedure: BIOPSY;  Surgeon: Albertus Gordy HERO, MD;  Location: [facility] ENDOSCOPY;  Service: Gastroenterology;;   ESOPHAGOGASTRODUODENOSCOPY (EGD) WITH PROPOFOL  N/A 10/21/2020   Procedure: ESOPHAGOGASTRODUODENOSCOPY (EGD) WITH PROPOFOL ;  Surgeon: Albertus Gordy HERO, MD;  Location: [facility] ENDOSCOPY;  Service: Gastroenterology;  Laterality: N/A;   HELLER MYOTOMY  1996   in [place]; w/gastric pull thru   HEMOSTASIS CLIP PLACEMENT  10/21/2020   Procedure: HEMOSTASIS CLIP PLACEMENT;  Surgeon: Albertus Gordy HERO, MD;  Location: [facility] ENDOSCOPY;  Service: Gastroenterology;;     reports that he has quit smoking. His smoking use included cigarettes. He has a 4.5 pack-year smoking history. He has never used smokeless tobacco. He reports that he does not currently use alcohol. He reports that he does not use drugs.  No Known Allergies  Family History  Problem Relation Age of Onset    Cervical cancer Mother    Alzheimer's disease Father    Stomach cancer Neg Hx    Esophageal cancer Neg Hx    Pancreatic cancer Neg Hx     Prior to Admission medications   Medication Sig Start Date End Date Taking? Authorizing Provider  Ascorbic Acid (VITAMIN C) 1000 MG tablet Take 1,000 mg by mouth daily.    [provider]  B Complex-C (SUPER B COMPLEX PO) Take 1 tablet by mouth daily.    [provider]  Camphor-Menthol-Methyl Sal (TIGER BALM MUSCLE RUB EX) Apply 1 application. topically as needed (back pain).    [provider]  Cholecalciferol (VITAMIN D3) 50 MCG (2000 UT) capsule Take 2,000 Units by mouth daily.    [provider]  clobetasol cream (TEMOVATE) 0.05 % Apply 1 Application topically daily. 10/06/22   [provider]  gabapentin  (NEURONTIN ) 100 MG capsule Take 1 capsule (100 mg total) by mouth every evening. 04/05/22     hydrALAZINE  (APRESOLINE ) 50 MG tablet Take 1 tablet (50 mg total) by mouth 3 (three) times daily. 11/14/22   Saguier, [place], PA-C  HYDROcodone -acetaminophen  (NORCO) 5-325 MG tablet Take 1 tablet by mouth every 8 (eight) hours as needed for severe pain. 01/02/23   Saguier, [place], PA-C  lidocaine  (LIDODERM ) 5 % Place 1 patch onto the skin daily as needed (back pain). Remove & Discard patch within 12 hours or as directed by MD    [provider]  lisinopril  (ZESTRIL ) 40 MG tablet Take 1 tablet (40 mg total) by mouth daily. 04/18/22   Saguier, [place], PA-C  metoprolol  succinate (TOPROL -XL) 50 MG 24 hr tablet Take 1 tablet (50 mg total) by mouth daily. TAKE WITH OR IMMEDIATELY FOLLOWING A MEAL. 12/10/20 10/27/22  Saguier, [place], PA-C  metoprolol  succinate (TOPROL -XL) 50 MG 24 hr tablet Take 1 tablet (50 mg total) by mouth daily. Take with or immediately following a meal. 01/11/22   Saguier, [place], PA-C  Misc Natural Products (OSTEO BI-FLEX ADV TRIPLE ST PO) Take 1 tablet by mouth daily.    [provider]   ondansetron  (ZOFRAN -ODT) 4 MG disintegrating tablet Take 1 tablet (4 mg total) by mouth every 8 (eight) hours as needed. 06/21/22   Long, Fonda MATSU, MD  OVER THE COUNTER MEDICATION Take 1 tablet by mouth daily. [employer]    [provider]  pantoprazole  (PROTONIX ) 40 MG tablet Take 1 tablet (40 mg total) by mouth daily. 10/27/22   Saguier, Dallas, PA-C  PARoxetine  (PAXIL ) 10 MG tablet Take 1 tablet (10 mg total) by mouth daily. 10/27/22   Saguier, Dallas, PA-C  polyvinyl alcohol (LIQUIFILM TEARS) 1.4 % ophthalmic solution Place 1 drop into both eyes as needed for dry eyes.    [provider]  potassium chloride  SA (KLOR-CON  M) 20 MEQ tablet Take 2 tablets (40 mEq total) by mouth daily for 5 days. 06/21/22 10/27/22  Long, Joshua G, MD  senna-docusate (SENOKOT-S) 8.6-50 MG tablet Take 1 tablet by mouth at bedtime as needed for mild to moderate constipation. 08/29/22   Saguier, [place], PA-C  Turmeric  Curcumin 500 MG CAPS Take 1 capsule by mouth daily.    [provider]  vitamin E 180 MG (400 UNITS) capsule Take 400 Units by mouth daily.    [provider]    Physical Exam: Vitals:   12/09/23 2146 12/09/23 2230 12/09/23 2300 12/09/23 2333  BP: 109/67 104/65 131/76 131/76  Pulse: (!) 104 (!) 101 92 93  Resp: 18 15  18   Temp: 99.7 F (37.6 C)   99.1 F (37.3 C)  TempSrc: Oral   Oral  SpO2: 97% 100%  100%  Weight:      Height:        Constitutional: NAD, calm, comfortable Vitals:   12/09/23 2146 12/09/23 2230 12/09/23 2300 12/09/23 2333  BP: 109/67 104/65 131/76 131/76  Pulse: (!) 104 (!) 101 92 93  Resp: 18 15  18   Temp: 99.7 F (37.6 C)   99.1 F (37.3 C)  TempSrc: Oral   Oral  SpO2: 97% 100%  100%  Weight:      Height:       Eyes: PERRL, lids and conjunctivae normal ENMT: Mucous membranes are moist. Posterior pharynx clear of any exudate or lesions.Normal dentition.  Neck: normal, supple, no masses, no  thyromegaly Respiratory: clear to auscultation bilaterally, no wheezing, no crackles. Normal respiratory effort. No accessory muscle use.  Cardiovascular: Regular rate and rhythm, no murmurs / rubs / gallops. No extremity edema. 2+ pedal pulses. No carotid bruits.  Abdomen: no tenderness, no masses palpated. No hepatosplenomegaly. Bowel sounds positive.  Musculoskeletal: no clubbing / cyanosis. No joint deformity upper and lower extremities. Good ROM, no contractures. Normal muscle tone.  Skin: no rashes, lesions, ulcers. No induration Neurologic: CN 2-12 grossly intact. Sensation intact, DTR normal. Strength 5/5 in all 4.  Psychiatric: Normal judgment and insight. Alert and oriented x 3. Normal mood.    Labs on Admission: I have personally reviewed following labs and imaging studies  CBC: Recent Labs  Lab 12/09/23 1742  WBC 9.5  HGB 7.3*  HCT 21.5*  MCV 90.7  PLT 179   Basic Metabolic Panel: Recent Labs  Lab 12/09/23 1742  NA 136  K 4.0  CL 102  CO2 23  GLUCOSE 131*  BUN 36*  CREATININE 0.78  CALCIUM 8.5*   GFR: Estimated Creatinine Clearance: 87.2 mL/min (by C-G formula based on SCr of 0.78 mg/dL). Liver Function Tests: Recent Labs  Lab 12/09/23 1742  AST 48*  ALT 31  ALKPHOS 46  BILITOT 0.3  PROT 5.8*  ALBUMIN 3.8   Recent Labs  Lab 12/09/23 1742  LIPASE 21   No results for input(s): AMMONIA in the last 168 hours. Coagulation Profile: No results for input(s): INR, PROTIME in the last 168 hours. Cardiac Enzymes: No results for input(s): CKTOTAL, CKMB, CKMBINDEX, TROPONINI in the last 168 hours. BNP (last 3 results) No results for input(s): PROBNP in the last 8760 hours. HbA1C: No results for input(s): HGBA1C in the last 72 hours. CBG: No results for input(s): GLUCAP in the last 168 hours. Lipid Profile: No results for input(s): CHOL, HDL, LDLCALC, TRIG, CHOLHDL, LDLDIRECT in the last 72 hours. Thyroid Function  Tests: No results for input(s): TSH, T4TOTAL, FREET4, T3FREE, THYROIDAB in the last 72 hours. Anemia Panel: No results for input(s): VITAMINB12, FOLATE, FERRITIN, TIBC, IRON, RETICCTPCT in the last 72 hours. Urine analysis:    Component Value Date/Time   COLORURINE YELLOW 12/09/2023 1742   APPEARANCEUR CLEAR 12/09/2023 1742   LABSPEC 1.015 12/09/2023 1742  PHURINE 6.0 12/09/2023 1742   GLUCOSEU NEGATIVE 12/09/2023 1742   HGBUR NEGATIVE 12/09/2023 1742   BILIRUBINUR NEGATIVE 12/09/2023 1742   BILIRUBINUR 1 04/26/2020 1034   KETONESUR NEGATIVE 12/09/2023 1742   PROTEINUR NEGATIVE 12/09/2023 1742   UROBILINOGEN 1.0 04/26/2020 1034   NITRITE NEGATIVE 12/09/2023 1742   LEUKOCYTESUR NEGATIVE 12/09/2023 1742    Radiological Exams on Admission: CT CHEST ABDOMEN PELVIS W CONTRAST Result Date: 12/09/2023 EXAM: CT CHEST, ABDOMEN AND PELVIS WITH CONTRAST 12/09/2023 09:01:00 PM TECHNIQUE: CT of the chest, abdomen and pelvis was performed with the administration of intravenous contrast. Multiplanar reformatted images are provided for review. Automated exposure control, iterative reconstruction, and/or weight based adjustment of the mA/kV was utilized to reduce the radiation dose to as low as reasonably achievable. COMPARISON: CT abdomen/pelvis dated 03/07/2018 and CT chest dated 04/21/2004. CLINICAL HISTORY: Respiratory illness, nondiagnostic xray. Pt states that he has had some dizziness and chills since Thursday. Family states that pt is out of his blood pressure medication. Pt also states pain in his ribs. Denies cough, fever. Pt states that he fell this morning after he stood up. Hx of ETOH. Last drank Wednesday. FINDINGS: CHEST: MEDIASTINUM AND LYMPH NODES: Status post esophagectomy with gastric pull-through. No mediastinal, hilar or axillary lymphadenopathy. LUNGS AND PLEURA: Scattered peribronchovascular nodularity including chronic nodularity in the right upper lobe measuring  up to 6 mm (image 35), benign. Bronchiectasis particularly in the inferior right upper lobe and superior segment left lower lobe, chronic. No new/suspicious pulmonary nodules. No pleural effusion or pneumothorax. ABDOMEN AND PELVIS: LIVER: The liver is unremarkable. GALLBLADDER AND BILE DUCTS: Gallbladder is unremarkable. No biliary ductal dilatation. SPLEEN: No acute abnormality. PANCREAS: No acute abnormality. ADRENAL GLANDS: No acute abnormality. KIDNEYS, URETERS AND BLADDER: No stones in the kidneys or ureters. No hydronephrosis. No perinephric or periureteral stranding. Mildly thick-walled bladder, underdistended. GI AND BOWEL: Normal appendix (image 92). Stomach demonstrates no acute abnormality. There is no bowel obstruction. REPRODUCTIVE ORGANS: Prostate is unremarkable. PERITONEUM AND RETROPERITONEUM: No ascites. No free air. VASCULATURE: Mild thoracic aortic atherosclerosis. Atherosclerotic calcifications of the abdominal aorta and branch vessels. ABDOMINAL AND PELVIS LYMPH NODES: No lymphadenopathy. REPRODUCTIVE ORGANS: No acute abnormality. BONES AND SOFT TISSUES: Acute segmental nondisplaced right posterior 11th and 12th rib fractures. Additional old chronic right rib fracture deformities. Mild degenerative changes at L1-2 and L5-S1. IMPRESSION: 1. Acute right posterior 11th and 12th rib fractures, as above. No pneumothorax. 2. Status post esophagectomy with gastric pull through. 3. Additional stable ancillary findings as above. Electronically signed by: Pinkie Pebbles MD 12/09/2023 09:12 PM EDT RP Workstation: [id number]   CT Head Wo Contrast Result Date: 12/09/2023 CLINICAL DATA:  Fall this morning. Dizziness and chills since Thursday. EXAM: CT HEAD WITHOUT CONTRAST CT CERVICAL SPINE WITHOUT CONTRAST TECHNIQUE: Multidetector CT imaging of the head and cervical spine was performed following the standard protocol without intravenous contrast. Multiplanar CT image reconstructions of the cervical  spine were also generated. RADIATION DOSE REDUCTION: This exam was performed according to the departmental dose-optimization program which includes automated exposure control, adjustment of the mA and/or kV according to patient size and/or use of iterative reconstruction technique. COMPARISON:  Head CT 03/10/2003, MRI cervical spine 04/26/2021 FINDINGS: CT HEAD FINDINGS Brain: Ventricles, cisterns and other CSF spaces are normal. Minimal chronic ischemic microvascular disease. No mass, mass effect, shift of midline structures or acute hemorrhage. No acute infarction. Vascular: No hyperdense vessel or unexpected calcification. Skull: Normal. Negative for fracture or focal lesion. Sinuses/Orbits: No acute finding. Other:  None. CT CERVICAL SPINE FINDINGS Alignment: No posttraumatic subluxation. Skull base and vertebrae: Vertebral body heights are normal. There is mild spondylosis throughout the cervical spine to include uncovertebral joint spurring and facet arthropathy. Minimal right-sided neural foraminal narrowing at the C2-3 level. Moderate bilateral neural foraminal narrowing at the C3-4 level and C4-5 levels. Left-sided neural foraminal narrowing at the C5-6 level and bilateral neural foraminal narrowing at the C6-7 level. Minimal bilateral neural foraminal narrowing at the C7-T1 level. Several small scattered lucent lesions throughout the vertebral bodies of the visualized cervicothoracic spine. Soft tissues and spinal canal: Prevertebral soft tissues are normal. Spinal canal is unremarkable. Disc levels: Moderate disc space narrowing at the C5-6, C6-7 and C7-T1 levels. Upper chest: Air collection over the right neck base lateral to the trachea at the T1-T3 level with suggestion of surgical sutures along the inferolateral periphery as this is likely postsurgical versus lung herniation. Other: None. IMPRESSION: 1. No acute brain injury. 2. Minimal chronic ischemic microvascular disease. 3. No acute cervical spine  injury. 4. Mild spondylosis throughout the cervical spine with multilevel disc disease and neural foraminal narrowing as described. 5. Several small scattered lucent lesions throughout the vertebral bodies of the visualized cervicothoracic spine. These are nonspecific and may be due to osteopenia, however metastatic disease or multiple myeloma is possible. 6. Air collection over the right neck base lateral to the trachea at the T1-T3 level with suggestion of surgical sutures along the inferolateral periphery as this is likely postsurgical versus lung herniation. Recommend correlation with clinical history. Electronically Signed   By: Toribio Agreste M.D.   On: 12/09/2023 18:35   CT Cervical Spine Wo Contrast Result Date: 12/09/2023 CLINICAL DATA:  Fall this morning. Dizziness and chills since Thursday. EXAM: CT HEAD WITHOUT CONTRAST CT CERVICAL SPINE WITHOUT CONTRAST TECHNIQUE: Multidetector CT imaging of the head and cervical spine was performed following the standard protocol without intravenous contrast. Multiplanar CT image reconstructions of the cervical spine were also generated. RADIATION DOSE REDUCTION: This exam was performed according to the departmental dose-optimization program which includes automated exposure control, adjustment of the mA and/or kV according to patient size and/or use of iterative reconstruction technique. COMPARISON:  Head CT 03/10/2003, MRI cervical spine 04/26/2021 FINDINGS: CT HEAD FINDINGS Brain: Ventricles, cisterns and other CSF spaces are normal. Minimal chronic ischemic microvascular disease. No mass, mass effect, shift of midline structures or acute hemorrhage. No acute infarction. Vascular: No hyperdense vessel or unexpected calcification. Skull: Normal. Negative for fracture or focal lesion. Sinuses/Orbits: No acute finding. Other: None. CT CERVICAL SPINE FINDINGS Alignment: No posttraumatic subluxation. Skull base and vertebrae: Vertebral body heights are normal. There is  mild spondylosis throughout the cervical spine to include uncovertebral joint spurring and facet arthropathy. Minimal right-sided neural foraminal narrowing at the C2-3 level. Moderate bilateral neural foraminal narrowing at the C3-4 level and C4-5 levels. Left-sided neural foraminal narrowing at the C5-6 level and bilateral neural foraminal narrowing at the C6-7 level. Minimal bilateral neural foraminal narrowing at the C7-T1 level. Several small scattered lucent lesions throughout the vertebral bodies of the visualized cervicothoracic spine. Soft tissues and spinal canal: Prevertebral soft tissues are normal. Spinal canal is unremarkable. Disc levels: Moderate disc space narrowing at the C5-6, C6-7 and C7-T1 levels. Upper chest: Air collection over the right neck base lateral to the trachea at the T1-T3 level with suggestion of surgical sutures along the inferolateral periphery as this is likely postsurgical versus lung herniation. Other: None. IMPRESSION: 1. No acute brain injury. 2.  Minimal chronic ischemic microvascular disease. 3. No acute cervical spine injury. 4. Mild spondylosis throughout the cervical spine with multilevel disc disease and neural foraminal narrowing as described. 5. Several small scattered lucent lesions throughout the vertebral bodies of the visualized cervicothoracic spine. These are nonspecific and may be due to osteopenia, however metastatic disease or multiple myeloma is possible. 6. Air collection over the right neck base lateral to the trachea at the T1-T3 level with suggestion of surgical sutures along the inferolateral periphery as this is likely postsurgical versus lung herniation. Recommend correlation with clinical history. Electronically Signed   By: Toribio Agreste M.D.   On: 12/09/2023 18:35   DG Chest Port 1 View Result Date: 12/09/2023 CLINICAL DATA:  Chest pain right-sided.  Fall today. EXAM: PORTABLE CHEST 1 VIEW COMPARISON:  06/20/2022 FINDINGS: Lungs are hyperexpanded  without focal airspace consolidation or effusion. Cardiomediastinal silhouette is unchanged. 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 Remainder the exam is unchanged. IMPRESSION: 1. No acute cardiopulmonary disease. 2. Postsurgical change compatible with patient's esophagectomy/gastric pull-through procedure. Electronically Signed   By: Toribio Agreste M.D.   On: 12/09/2023 17:50    EKG: Independently reviewed.   Assessment/Plan  Acute GI bleed  with Symptomatic Anemia  -Hx of GERD/PUD -hx of bleed due to gastric ulcer  -pt p/w dizziness -hgb noted to have drop from around 13 one year ago to now 7.1 -FOB + -admit to progressive care  - continue on protonix  iv  - npo  - Gi Dr Dianna consulted in ED - transfuse now due to be symptomatic   Symptomatic anemia - due to blood loss  -transfuse 1 PRBC now  -cycle hgb  - will check iron stores  - consider iron infusion based on results   ETOH abuse  -no active w/d currently  -placed on ciwa prn ativan  -continue mvi/thiamine /folate   Acute right posterior 11th and 12th rib fractures -supportive care  -pulmonary toilet   Abnormal EKG -due to demand related to anemia  - cycle ce  -f/u  with echo   Hypertension -hold outpatient medication in setting of relative hypotension  DVT prophylaxis: scd Code Status: full/ as discussed per patient wishes in event of cardiac arrest  Family Communication: none at bedside Disposition Plan: full/ as discussed per patient wishes in event of cardiac arrest  Consults called: Dr Dianna , called by EDP Admission status: Progressive care   Nathaniel DELENA Ned MD [facility]   If 7PM-7AM, please contact night-coverage [email or web address] Password [email or web address]  12/09/2023, 11:40 PM

## 2023-12-09 NOTE — ED Triage Notes (Signed)
 Pt states that he has had some dizziness and chills since Thursday. Family states that pt is out of his blood pressure medication. Pt also states pain in his ribs. Denies cough, fever. Pt states that he fell this morning after he stood up. Hx of ETOH. Last drank Wednesday

## 2023-12-09 NOTE — ED Notes (Signed)
 ED TO INPATIENT HANDOFF REPORT  ED Nurse Name and Phone #: Thornell Herring, RN  S Name/Age/Gender Nathaniel Williams 63 y.o. male Room/Bed: MH12/MH12  Code Status   Code Status: Prior  Home/SNF/Other Home Patient oriented to: self, place, time, and situation Is this baseline? Yes   Triage Complete: Triage complete  Chief Complaint Symptomatic anemia [D64.9]  Triage Note Pt states that he has had some dizziness and chills since Thursday. Family states that pt is out of his blood pressure medication. Pt also states pain in his ribs. Denies cough, fever. Pt states that he fell this morning after he stood up. Hx of ETOH. Last drank Wednesday    Allergies No Known Allergies  Level of Care/Admitting Diagnosis ED Disposition     ED Disposition  Admit   Condition  --   Comment  Hospital Area: [facility] [[id number]]  Level of Care: Progressive [102]  Admit to Progressive based on following criteria: GI, ENDOCRINE disease patients with GI bleeding, acute liver failure or pancreatitis, stable with diabetic ketoacidosis or thyrotoxicosis (hypothyroid) state.  May admit patient to [facility] or [facility] if equivalent level of care is available:: Yes  Interfacility transfer: Yes  Covid Evaluation: Asymptomatic - no recent exposure (last 10 days) testing not required  Diagnosis: Symptomatic anemia [8671310]  Admitting Physician: CHARLTON EVALENE RAMAN [[id number]]  Attending Physician: CHARLTON EVALENE RAMAN [[id number]]  Certification:: I certify this patient will need inpatient services for at least 2 midnights  Expected Medical Readiness: 12/12/2023          B Medical/Surgery History Past Medical History:  Diagnosis Date   Achalasia 1996   Dx in [place] s/p Heller Myotomy w/gastric pull-thru   Alcohol use    Aspiration pneumonia (HCC)    Cervical spondylosis    Gastric ulcer    Gastritis    GI bleed    H/O hydrocele    of scrotum   Hypertension    IDA (iron  deficiency anemia)    Past Surgical History:  Procedure Laterality Date   BIOPSY  10/21/2020   Procedure: BIOPSY;  Surgeon: Albertus Gordy HERO, MD;  Location: [facility] ENDOSCOPY;  Service: Gastroenterology;;   ESOPHAGOGASTRODUODENOSCOPY (EGD) WITH PROPOFOL  N/A 10/21/2020   Procedure: ESOPHAGOGASTRODUODENOSCOPY (EGD) WITH PROPOFOL ;  Surgeon: Albertus Gordy HERO, MD;  Location: [facility] ENDOSCOPY;  Service: Gastroenterology;  Laterality: N/A;   HELLER MYOTOMY  1996   in [place]; w/gastric pull thru   HEMOSTASIS CLIP PLACEMENT  10/21/2020   Procedure: HEMOSTASIS CLIP PLACEMENT;  Surgeon: Albertus Gordy HERO, MD;  Location: [facility] ENDOSCOPY;  Service: Gastroenterology;;     A IV Location/Drains/Wounds Patient Lines/Drains/Airways Status     Active Line/Drains/Airways     Name Placement date Placement time Site Days   Peripheral IV 12/09/23 20 G 1.16 Anterior;Distal;Right;Upper Arm 12/09/23  1756  Arm  less than 1   Peripheral IV 12/09/23 20 G 1.16 Anterior;Left;Proximal Forearm 12/09/23  1830  Forearm  less than 1            Intake/Output Last 24 hours  Intake/Output Summary (Last 24 hours) at 12/09/2023 2237 Last data filed at 12/09/2023 1919 Gross per 24 hour  Intake 1000 ml  Output --  Net 1000 ml    Labs/Imaging Results for orders placed or performed during the hospital encounter of 12/09/23 (from the past 48 hours)  Resp panel by RT-PCR (RSV, Flu A&B, Covid) Anterior Nasal Swab     Status: None   Collection Time: 12/09/23  5:09  PM   Specimen: Anterior Nasal Swab  Result Value Ref Range   SARS Coronavirus 2 by RT PCR NEGATIVE NEGATIVE    Comment: (NOTE) SARS-CoV-2 target nucleic acids are NOT DETECTED.  The SARS-CoV-2 RNA is generally detectable in upper respiratory specimens during the acute phase of infection. The lowest concentration of SARS-CoV-2 viral copies this assay can detect is 138 copies/mL. A negative result does not preclude SARS-Cov-2 infection and should not be used as the sole  basis for treatment or other patient management decisions. A negative result may occur with  improper specimen collection/handling, submission of specimen other than nasopharyngeal swab, presence of viral mutation(s) within the areas targeted by this assay, and inadequate number of viral copies(<138 copies/mL). A negative result must be combined with clinical observations, patient history, and epidemiological information. The expected result is Negative.  Fact Sheet for Patients:  BloggerCourse.com  Fact Sheet for Healthcare Providers:  SeriousBroker.it  This test is no t yet approved or cleared by the [place]  FDA and  has been authorized for detection and/or diagnosis of SARS-CoV-2 by FDA under an Emergency Use Authorization (EUA). This EUA will remain  in effect (meaning this test can be used) for the duration of the COVID-19 declaration under Section 564(b)(1) of the Act, 21 U.S.C.section 360bbb-3(b)(1), unless the authorization is terminated  or revoked sooner.       Influenza A by PCR NEGATIVE NEGATIVE   Influenza B by PCR NEGATIVE NEGATIVE    Comment: (NOTE) The Xpert Xpress SARS-CoV-2/FLU/RSV plus assay is intended as an aid in the diagnosis of influenza from Nasopharyngeal swab specimens and should not be used as a sole basis for treatment. Nasal washings and aspirates are unacceptable for Xpert Xpress SARS-CoV-2/FLU/RSV testing.  Fact Sheet for Patients: BloggerCourse.com  Fact Sheet for Healthcare Providers: SeriousBroker.it  This test is not yet approved or cleared by the [place]  FDA and has been authorized for detection and/or diagnosis of SARS-CoV-2 by FDA under an Emergency Use Authorization (EUA). This EUA will remain in effect (meaning this test can be used) for the duration of the COVID-19 declaration under Section 564(b)(1) of the Act, 21  U.S.C. section 360bbb-3(b)(1), unless the authorization is terminated or revoked.     Resp Syncytial Virus by PCR NEGATIVE NEGATIVE    Comment: (NOTE) Fact Sheet for Patients: BloggerCourse.com  Fact Sheet for Healthcare Providers: SeriousBroker.it  This test is not yet approved or cleared by the [place]  FDA and has been authorized for detection and/or diagnosis of SARS-CoV-2 by FDA under an Emergency Use Authorization (EUA). This EUA will remain in effect (meaning this test can be used) for the duration of the COVID-19 declaration under Section 564(b)(1) of the Act, 21 U.S.C. section 360bbb-3(b)(1), unless the authorization is terminated or revoked.  Performed at [facility], [address] [place], [place] [address]   Occult blood card to lab, stool Provider will collect     Status: Abnormal   Collection Time: 12/09/23  5:09 PM  Result Value Ref Range   Fecal Occult Bld POSITIVE (A) NEGATIVE    Comment: Performed at [facility], [address] [place], [place] [address]  Comprehensive metabolic panel     Status: Abnormal   Collection Time: 12/09/23  5:42 PM  Result Value Ref Range   Sodium 136 135 - 145 mmol/L   Potassium 4.0 3.5 - 5.1 mmol/L   Chloride 102 98 - 111 mmol/L   CO2 23 22 -  32 mmol/L   Glucose, Bld 131 (H) 70 - 99 mg/dL    Comment: Glucose reference range applies only to samples taken after fasting for at least 8 hours.   BUN 36 (H) 8 - 23 mg/dL   Creatinine, Ser 9.21 0.61 - 1.24 mg/dL   Calcium 8.5 (L) 8.9 - 10.3 mg/dL   Total Protein 5.8 (L) 6.5 - 8.1 g/dL   Albumin 3.8 3.5 - 5.0 g/dL   AST 48 (H) 15 - 41 U/L   ALT 31 0 - 44 U/L   Alkaline Phosphatase 46 38 - 126 U/L   Total Bilirubin 0.3 0.0 - 1.2 mg/dL   GFR, Estimated >39 >39 mL/min    Comment: (NOTE) Calculated using the CKD-EPI Creatinine Equation (2021)    Anion gap 11 5 - 15    Comment: Performed at [facility], [address] [place], [place] [address]  CBC     Status: Abnormal   Collection Time: 12/09/23  5:42 PM  Result Value Ref Range   WBC 9.5 4.0 - 10.5 K/uL   RBC 2.37 (L) 4.22 - 5.81 MIL/uL   Hemoglobin 7.3 (L) 13.0 - 17.0 g/dL   HCT 78.4 (L) 60.9 - 47.9 %   MCV 90.7 80.0 - 100.0 fL   MCH 30.8 26.0 - 34.0 pg   MCHC 34.0 30.0 - 36.0 g/dL   RDW 87.3 88.4 - 84.4 %   Platelets 179 150 - 400 K/uL   nRBC 0.0 0.0 - 0.2 %    Comment: Performed at [facility], [address] [place], [place] [address]  Urinalysis, Routine w reflex microscopic -Urine, Clean Catch     Status: None   Collection Time: 12/09/23  5:42 PM  Result Value Ref Range   Color, Urine YELLOW YELLOW   APPearance CLEAR CLEAR   Specific Gravity, Urine 1.015 1.005 - 1.030   pH 6.0 5.0 - 8.0   Glucose, UA NEGATIVE NEGATIVE mg/dL   Hgb urine dipstick NEGATIVE NEGATIVE   Bilirubin Urine NEGATIVE NEGATIVE   Ketones, ur NEGATIVE NEGATIVE mg/dL   Protein, ur NEGATIVE NEGATIVE mg/dL   Nitrite NEGATIVE NEGATIVE   Leukocytes,Ua NEGATIVE NEGATIVE    Comment: Microscopic not done on urines with negative protein, blood, leukocytes, nitrite, or glucose < 500 mg/dL. Performed at [facility], [address] [place], [place] [address]   Lipase, blood     Status: None   Collection Time: 12/09/23  5:42 PM  Result Value Ref Range   Lipase 21 11 - 51 U/L    Comment: Performed at [facility], [address] [place], [place] [address]  Troponin T, High Sensitivity     Status: None   Collection Time: 12/09/23  5:42 PM  Result Value Ref Range   Troponin T High Sensitivity 16 0 - 19 ng/L    Comment: (NOTE) Biotin concentrations > 1000 ng/mL falsely decrease TnT results.  Serial cardiac troponin measurements are suggested.  Refer to the Links section for chest pain algorithms and additional  guidance. Performed at [facility], [address] [place], [place] [address]    Ethanol     Status: None   Collection Time: 12/09/23  5:42 PM  Result Value Ref Range   Alcohol, Ethyl (B) <15 <15 mg/dL    Comment: (NOTE) For medical purposes only. Performed at [facility], [address] [place], [place] [address]  Lactic acid, plasma     Status: Abnormal   Collection Time: 12/09/23  5:43 PM  Result Value Ref Range   Lactic Acid, Venous 2.1 (HH) 0.5 - 1.9 mmol/L    Comment: Critical Value, Read Back and verified with Erla Haste RN @ [phone] 12/09/23. MSS Performed at [facility], [address] [place], [place] [address]   Lactic acid, plasma     Status: Abnormal   Collection Time: 12/09/23  7:30 PM  Result Value Ref Range   Lactic Acid, Venous 2.2 (HH) 0.5 - 1.9 mmol/L    Comment: Critical value noted. Value is consistent with previously reported and called value  Performed at [facility], [address] [place], [place] [address]   Troponin T, High Sensitivity     Status: None   Collection Time: 12/09/23  7:30 PM  Result Value Ref Range   Troponin T High Sensitivity 16 0 - 19 ng/L    Comment: (NOTE) Biotin concentrations > 1000 ng/mL falsely decrease TnT results.  Serial cardiac troponin measurements are suggested.  Refer to the Links section for chest pain algorithms and additional  guidance. Performed at [facility], [address] [place], [place] [address]    CT CHEST ABDOMEN PELVIS W CONTRAST Result Date: 12/09/2023 EXAM: CT CHEST, ABDOMEN AND PELVIS WITH CONTRAST 12/09/2023 09:01:00 PM TECHNIQUE: CT of the chest, abdomen and pelvis was performed with the administration of intravenous contrast. Multiplanar reformatted images are provided for review. Automated exposure control, iterative reconstruction, and/or weight based adjustment of the mA/kV was utilized to reduce the radiation dose to as low as reasonably achievable. COMPARISON: CT abdomen/pelvis dated 03/07/2018 and CT chest dated 04/21/2004.  CLINICAL HISTORY: Respiratory illness, nondiagnostic xray. Pt states that he has had some dizziness and chills since Thursday. Family states that pt is out of his blood pressure medication. Pt also states pain in his ribs. Denies cough, fever. Pt states that he fell this morning after he stood up. Hx of ETOH. Last drank Wednesday. FINDINGS: CHEST: MEDIASTINUM AND LYMPH NODES: Status post esophagectomy with gastric pull-through. No mediastinal, hilar or axillary lymphadenopathy. LUNGS AND PLEURA: Scattered peribronchovascular nodularity including chronic nodularity in the right upper lobe measuring up to 6 mm (image 35), benign. Bronchiectasis particularly in the inferior right upper lobe and superior segment left lower lobe, chronic. No new/suspicious pulmonary nodules. No pleural effusion or pneumothorax. ABDOMEN AND PELVIS: LIVER: The liver is unremarkable. GALLBLADDER AND BILE DUCTS: Gallbladder is unremarkable. No biliary ductal dilatation. SPLEEN: No acute abnormality. PANCREAS: No acute abnormality. ADRENAL GLANDS: No acute abnormality. KIDNEYS, URETERS AND BLADDER: No stones in the kidneys or ureters. No hydronephrosis. No perinephric or periureteral stranding. Mildly thick-walled bladder, underdistended. GI AND BOWEL: Normal appendix (image 92). Stomach demonstrates no acute abnormality. There is no bowel obstruction. REPRODUCTIVE ORGANS: Prostate is unremarkable. PERITONEUM AND RETROPERITONEUM: No ascites. No free air. VASCULATURE: Mild thoracic aortic atherosclerosis. Atherosclerotic calcifications of the abdominal aorta and branch vessels. ABDOMINAL AND PELVIS LYMPH NODES: No lymphadenopathy. REPRODUCTIVE ORGANS: No acute abnormality. BONES AND SOFT TISSUES: Acute segmental nondisplaced right posterior 11th and 12th rib fractures. Additional old chronic right rib fracture deformities. Mild degenerative changes at L1-2 and L5-S1. IMPRESSION: 1. Acute right posterior 11th and 12th rib fractures, as above.  No pneumothorax. 2. Status post esophagectomy with gastric pull through. 3. Additional stable ancillary findings as above. Electronically signed by: Pinkie Pebbles MD 12/09/2023 09:12 PM EDT RP Workstation: [id number]  CT Head Wo Contrast Result Date: 12/09/2023 CLINICAL DATA:  Fall this morning. Dizziness and chills since Thursday. EXAM: CT HEAD WITHOUT CONTRAST CT CERVICAL SPINE WITHOUT CONTRAST TECHNIQUE: Multidetector CT imaging of the head and cervical spine was performed following the standard protocol without intravenous contrast. Multiplanar CT image reconstructions of the cervical spine were also generated. RADIATION DOSE REDUCTION: This exam was performed according to the departmental dose-optimization program which includes automated exposure control, adjustment of the mA and/or kV according to patient size and/or use of iterative reconstruction technique. COMPARISON:  Head CT 03/10/2003, MRI cervical spine 04/26/2021 FINDINGS: CT HEAD FINDINGS Brain: Ventricles, cisterns and other CSF spaces are normal. Minimal chronic ischemic microvascular disease. No mass, mass effect, shift of midline structures or acute hemorrhage. No acute infarction. Vascular: No hyperdense vessel or unexpected calcification. Skull: Normal. Negative for fracture or focal lesion. Sinuses/Orbits: No acute finding. Other: None. CT CERVICAL SPINE FINDINGS Alignment: No posttraumatic subluxation. Skull base and vertebrae: Vertebral body heights are normal. There is mild spondylosis throughout the cervical spine to include uncovertebral joint spurring and facet arthropathy. Minimal right-sided neural foraminal narrowing at the C2-3 level. Moderate bilateral neural foraminal narrowing at the C3-4 level and C4-5 levels. Left-sided neural foraminal narrowing at the C5-6 level and bilateral neural foraminal narrowing at the C6-7 level. Minimal bilateral neural foraminal narrowing at the C7-T1 level. Several small scattered lucent  lesions throughout the vertebral bodies of the visualized cervicothoracic spine. Soft tissues and spinal canal: Prevertebral soft tissues are normal. Spinal canal is unremarkable. Disc levels: Moderate disc space narrowing at the C5-6, C6-7 and C7-T1 levels. Upper chest: Air collection over the right neck base lateral to the trachea at the T1-T3 level with suggestion of surgical sutures along the inferolateral periphery as this is likely postsurgical versus lung herniation. Other: None. IMPRESSION: 1. No acute brain injury. 2. Minimal chronic ischemic microvascular disease. 3. No acute cervical spine injury. 4. Mild spondylosis throughout the cervical spine with multilevel disc disease and neural foraminal narrowing as described. 5. Several small scattered lucent lesions throughout the vertebral bodies of the visualized cervicothoracic spine. These are nonspecific and may be due to osteopenia, however metastatic disease or multiple myeloma is possible. 6. Air collection over the right neck base lateral to the trachea at the T1-T3 level with suggestion of surgical sutures along the inferolateral periphery as this is likely postsurgical versus lung herniation. Recommend correlation with clinical history. Electronically Signed   By: Toribio Agreste M.D.   On: 12/09/2023 18:35   CT Cervical Spine Wo Contrast Result Date: 12/09/2023 CLINICAL DATA:  Fall this morning. Dizziness and chills since Thursday. EXAM: CT HEAD WITHOUT CONTRAST CT CERVICAL SPINE WITHOUT CONTRAST TECHNIQUE: Multidetector CT imaging of the head and cervical spine was performed following the standard protocol without intravenous contrast. Multiplanar CT image reconstructions of the cervical spine were also generated. RADIATION DOSE REDUCTION: This exam was performed according to the departmental dose-optimization program which includes automated exposure control, adjustment of the mA and/or kV according to patient size and/or use of iterative  reconstruction technique. COMPARISON:  Head CT 03/10/2003, MRI cervical spine 04/26/2021 FINDINGS: CT HEAD FINDINGS Brain: Ventricles, cisterns and other CSF spaces are normal. Minimal chronic ischemic microvascular disease. No mass, mass effect, shift of midline structures or acute hemorrhage. No acute infarction. Vascular: No hyperdense vessel or unexpected calcification. Skull: Normal. Negative for fracture or focal lesion. Sinuses/Orbits: No acute finding. Other: None. CT CERVICAL SPINE FINDINGS Alignment: No posttraumatic subluxation. Skull base and  vertebrae: Vertebral body heights are normal. There is mild spondylosis throughout the cervical spine to include uncovertebral joint spurring and facet arthropathy. Minimal right-sided neural foraminal narrowing at the C2-3 level. Moderate bilateral neural foraminal narrowing at the C3-4 level and C4-5 levels. Left-sided neural foraminal narrowing at the C5-6 level and bilateral neural foraminal narrowing at the C6-7 level. Minimal bilateral neural foraminal narrowing at the C7-T1 level. Several small scattered lucent lesions throughout the vertebral bodies of the visualized cervicothoracic spine. Soft tissues and spinal canal: Prevertebral soft tissues are normal. Spinal canal is unremarkable. Disc levels: Moderate disc space narrowing at the C5-6, C6-7 and C7-T1 levels. Upper chest: Air collection over the right neck base lateral to the trachea at the T1-T3 level with suggestion of surgical sutures along the inferolateral periphery as this is likely postsurgical versus lung herniation. Other: None. IMPRESSION: 1. No acute brain injury. 2. Minimal chronic ischemic microvascular disease. 3. No acute cervical spine injury. 4. Mild spondylosis throughout the cervical spine with multilevel disc disease and neural foraminal narrowing as described. 5. Several small scattered lucent lesions throughout the vertebral bodies of the visualized cervicothoracic spine. These are  nonspecific and may be due to osteopenia, however metastatic disease or multiple myeloma is possible. 6. Air collection over the right neck base lateral to the trachea at the T1-T3 level with suggestion of surgical sutures along the inferolateral periphery as this is likely postsurgical versus lung herniation. Recommend correlation with clinical history. Electronically Signed   By: Toribio Agreste M.D.   On: 12/09/2023 18:35   DG Chest Port 1 View Result Date: 12/09/2023 CLINICAL DATA:  Chest pain right-sided.  Fall today. EXAM: PORTABLE CHEST 1 VIEW COMPARISON:  06/20/2022 FINDINGS: Lungs are hyperexpanded without focal airspace consolidation or effusion. Cardiomediastinal silhouette is unchanged. 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 Remainder the exam is unchanged. IMPRESSION: 1. No acute cardiopulmonary disease. 2. Postsurgical change compatible with patient's esophagectomy/gastric pull-through procedure. Electronically Signed   By: Toribio Agreste M.D.   On: 12/09/2023 17:50    Pending Labs Unresulted Labs (From admission, onward)     Start     Ordered   12/09/23 1732  Culture, blood (Routine X 2) w Reflex to ID Panel  BLOOD CULTURE X 2,   STAT      12/09/23 1731            Vitals/Pain Today's Vitals   12/09/23 1930 12/09/23 1945 12/09/23 2125 12/09/23 2146  BP: 112/61  106/65 109/67  Pulse:  (!) 103 (!) 104 (!) 104  Resp: 17  20 18   Temp:   99.1 F (37.3 C) 99.7 F (37.6 C)  TempSrc:   Oral Oral  SpO2:    97%  Weight:      Height:      PainSc:        Isolation Precautions No active isolations  Medications Medications  0.9 %  sodium chloride  infusion ( Intravenous New Bag/Given 12/09/23 1919)  LORazepam  (ATIVAN ) injection 0-4 mg ( Intravenous Not Given 12/09/23 1815)    Or  LORazepam  (ATIVAN ) tablet 0-4 mg ( Oral See Alternative 12/09/23 1815)  LORazepam  (ATIVAN ) injection 0-4 mg (has no administration in time range)    Or   LORazepam  (ATIVAN ) tablet 0-4 mg (has no administration in time range)  thiamine  (VITAMIN B1) tablet 100 mg ( Oral See Alternative 12/09/23 1831)    Or  thiamine  (VITAMIN B1) injection 100 mg (100 mg Intravenous Given 12/09/23 1831)  pantoprazole  (PROTONIX ) injection  40 mg (40 mg Intravenous Given 12/09/23 2156)  sodium chloride  0.9 % bolus 1,000 mL (0 mLs Intravenous Stopped 12/09/23 1919)  iohexol  (OMNIPAQUE ) 300 MG/ML solution 100 mL (100 mLs Intravenous Contrast Given 12/09/23 2101)    Mobility walks     Focused Assessments    R Recommendations: See Admitting Provider Note  Report given to: Vertell, RN  Additional Notes:

## 2023-12-09 NOTE — ED Provider Notes (Addendum)
 [place] [facility]MENT AT [facility] Provider Note   CSN: [id number] Arrival date & time: 12/09/23  1657     Patient presents with: Dizziness   Nathaniel Williams is a 63 y.o. male.   Patient brought in by family member.  Patient with a complaint of dizziness and chills but no room spinning.  Since Thursday.  Patient complaint of pain in his ribs denies cough fever chest pain shortness of breath or abdominal pain.  Patient has a history of alcohol last drink was Wednesday.  Patient states he fell this morning when he stood up.  Patient was by himself.  Temp 97.8 pulse 103 blood pressure 99/55 oxygen sats are 97% on room air.  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  Apparently not for cancer is followed for anemia iron deficiency anemia by hematology oncology.       Prior to Admission medications   Medication Sig Start Date End Date Taking? Authorizing Provider  Ascorbic Acid (VITAMIN C) 1000 MG tablet Take 1,000 mg by mouth daily.    [provider]  B Complex-C (SUPER B COMPLEX PO) Take 1 tablet by mouth daily.    [provider]  Camphor-Menthol-Methyl Sal (TIGER BALM MUSCLE RUB EX) Apply 1 application. topically as needed (back pain).    [provider]  Cholecalciferol (VITAMIN D3) 50 MCG (2000 UT) capsule Take 2,000 Units by mouth daily.    [provider]  clobetasol cream (TEMOVATE) 0.05 % Apply 1 Application topically daily. 10/06/22   [provider]  gabapentin  (NEURONTIN ) 100 MG capsule Take 1 capsule (100 mg total) by mouth every evening. 04/05/22     hydrALAZINE  (APRESOLINE ) 50 MG tablet Take 1 tablet (50 mg total) by mouth 3 (three) times daily. 11/14/22   Saguier, [place], PA-C  HYDROcodone -acetaminophen  (NORCO) 5-325 MG tablet Take 1 tablet by mouth every 8 (eight) hours as needed for severe pain. 01/02/23   Saguier,  [place], PA-C  lidocaine  (LIDODERM ) 5 % Place 1 patch onto the skin daily as needed (back pain). Remove & Discard patch within 12 hours or as directed by MD    [provider]  lisinopril  (ZESTRIL ) 40 MG tablet Take 1 tablet (40 mg total) by mouth daily. 04/18/22   Saguier, [place], PA-C  metoprolol  succinate (TOPROL -XL) 50 MG 24 hr tablet Take 1 tablet (50 mg total) by mouth daily. TAKE WITH OR IMMEDIATELY FOLLOWING A MEAL. 12/10/20 10/27/22  Saguier, [place], PA-C  metoprolol  succinate (TOPROL -XL) 50 MG 24 hr tablet Take 1 tablet (50 mg total) by mouth daily. Take with or immediately following a meal. 01/11/22   Saguier, [place], PA-C  Misc Natural Products (OSTEO BI-FLEX ADV TRIPLE ST PO) Take 1 tablet by mouth daily.    [provider]  ondansetron  (ZOFRAN -ODT) 4 MG disintegrating tablet Take 1 tablet (4 mg total) by mouth every 8 (eight) hours as needed. 06/21/22   Long, Fonda MATSU, MD  OVER THE COUNTER MEDICATION Take 1 tablet by mouth daily. [employer]    [provider]  pantoprazole  (PROTONIX ) 40 MG tablet Take 1 tablet (40 mg total) by mouth daily. 10/27/22   Saguier, [place], PA-C  PARoxetine  (PAXIL ) 10 MG tablet Take 1 tablet (10 mg total) by mouth daily. 10/27/22   Saguier, [place], PA-C  polyvinyl alcohol (LIQUIFILM TEARS) 1.4 % ophthalmic solution Place 1 drop into both eyes as needed for  dry eyes.    [provider]  potassium chloride  SA (KLOR-CON  M) 20 MEQ tablet Take 2 tablets (40 mEq total) by mouth daily for 5 days. 06/21/22 10/27/22  Long, Joshua G, MD  senna-docusate (SENOKOT-S) 8.6-50 MG tablet Take 1 tablet by mouth at bedtime as needed for mild to moderate constipation. 08/29/22   Saguier, [place], PA-C  Turmeric Curcumin 500 MG CAPS Take 1 capsule by mouth daily.    [provider]  vitamin E 180 MG (400 UNITS) capsule Take 400 Units by mouth daily.    [provider]    Allergies: Patient has no known allergies.     Review of Systems  Constitutional:  Positive for chills. Negative for fever.  HENT:  Negative for ear pain and sore throat.   Eyes:  Negative for pain and visual disturbance.  Respiratory:  Negative for cough and shortness of breath.   Cardiovascular:  Positive for chest pain. Negative for palpitations.  Gastrointestinal:  Negative for abdominal pain and vomiting.  Genitourinary:  Negative for dysuria and hematuria.  Musculoskeletal:  Negative for arthralgias and back pain.  Skin:  Negative for color change and rash.  Neurological:  Positive for dizziness and tremors. Negative for seizures and syncope.  All other systems reviewed and are negative.   Updated Vital Signs BP 106/65   Pulse (!) 104   Temp 99.1 F (37.3 C) (Oral)   Resp 20   Ht 1.702 m (5' 7)   Wt 72.6 kg   SpO2 97%   BMI 25.06 kg/m   Physical Exam Vitals and nursing note reviewed.  Constitutional:      General: He is not in acute distress.    Appearance: He is well-developed. He is ill-appearing.  HENT:     Head: Normocephalic and atraumatic.     Mouth/Throat:     Mouth: Mucous membranes are dry.  Eyes:     Extraocular Movements: Extraocular movements intact.     Conjunctiva/sclera: Conjunctivae normal.     Pupils: Pupils are equal, round, and reactive to light.  Cardiovascular:     Rate and Rhythm: Normal rate and regular rhythm.     Heart sounds: No murmur heard. Pulmonary:     Effort: Pulmonary effort is normal. No respiratory distress.     Breath sounds: Normal breath sounds. No wheezing, rhonchi or rales.  Abdominal:     Palpations: Abdomen is soft.     Tenderness: There is no abdominal tenderness.  Genitourinary:    Rectum: Guaiac result positive.     Comments: Abnormal stool very melanotic.  Black in color. Musculoskeletal:        General: No swelling.     Cervical back: Normal range of motion and neck supple. No rigidity.     Right lower leg: No edema.     Left lower leg: No edema.   Skin:    General: Skin is warm and dry.     Capillary Refill: Capillary refill takes less than 2 seconds.  Neurological:     General: No focal deficit present.     Mental Status: He is alert and oriented to person, place, and time.     Cranial Nerves: No cranial nerve deficit.     Sensory: No sensory deficit.     Motor: No weakness.     Comments: Positive tremor  Psychiatric:        Mood and Affect: Mood normal.     (all labs ordered are listed, but only  abnormal results are displayed) Labs Reviewed  COMPREHENSIVE METABOLIC PANEL WITH GFR - Abnormal; Notable for the following components:      Result Value   Glucose, Bld 131 (*)    BUN 36 (*)    Calcium 8.5 (*)    Total Protein 5.8 (*)    AST 48 (*)    All other components within normal limits  CBC - Abnormal; Notable for the following components:   RBC 2.37 (*)    Hemoglobin 7.3 (*)    HCT 21.5 (*)    All other components within normal limits  LACTIC ACID, PLASMA - Abnormal; Notable for the following components:   Lactic Acid, Venous 2.1 (*)    All other components within normal limits  LACTIC ACID, PLASMA - Abnormal; Notable for the following components:   Lactic Acid, Venous 2.2 (*)    All other components within normal limits  OCCULT BLOOD X 1 CARD TO LAB, STOOL - Abnormal; Notable for the following components:   Fecal Occult Bld POSITIVE (*)    All other components within normal limits  RESP PANEL BY RT-PCR (RSV, FLU A&B, COVID)  RVPGX2  CULTURE, BLOOD (ROUTINE X 2)  CULTURE, BLOOD (ROUTINE X 2)  URINALYSIS, ROUTINE W REFLEX MICROSCOPIC  LIPASE, BLOOD  ETHANOL  TROPONIN T, HIGH SENSITIVITY  TROPONIN T, HIGH SENSITIVITY    EKG: EKG Interpretation Date/Time:  Sunday December 09 2023 17:27:49 EDT Ventricular Rate:  86 PR Interval:  190 QRS Duration:  92 QT Interval:  379 QTC Calculation: 454 R Axis:   76  Text Interpretation: Sinus rhythm Atrial premature complex Consider right atrial enlargement Left  ventricular hypertrophy Abnormal T, consider ischemia, diffuse leads Confirmed by Tianah Lonardo [phone]) on 12/09/2023 5:35:55 PM  Radiology: CT CHEST ABDOMEN PELVIS W CONTRAST Result Date: 12/09/2023 EXAM: CT CHEST, ABDOMEN AND PELVIS WITH CONTRAST 12/09/2023 09:01:00 PM TECHNIQUE: CT of the chest, abdomen and pelvis was performed with the administration of intravenous contrast. Multiplanar reformatted images are provided for review. Automated exposure control, iterative reconstruction, and/or weight based adjustment of the mA/kV was utilized to reduce the radiation dose to as low as reasonably achievable. COMPARISON: CT abdomen/pelvis dated 03/07/2018 and CT chest dated 04/21/2004. CLINICAL HISTORY: Respiratory illness, nondiagnostic xray. Pt states that he has had some dizziness and chills since Thursday. Family states that pt is out of his blood pressure medication. Pt also states pain in his ribs. Denies cough, fever. Pt states that he fell this morning after he stood up. Hx of ETOH. Last drank Wednesday. FINDINGS: CHEST: MEDIASTINUM AND LYMPH NODES: Status post esophagectomy with gastric pull-through. No mediastinal, hilar or axillary lymphadenopathy. LUNGS AND PLEURA: Scattered peribronchovascular nodularity including chronic nodularity in the right upper lobe measuring up to 6 mm (image 35), benign. Bronchiectasis particularly in the inferior right upper lobe and superior segment left lower lobe, chronic. No new/suspicious pulmonary nodules. No pleural effusion or pneumothorax. ABDOMEN AND PELVIS: LIVER: The liver is unremarkable. GALLBLADDER AND BILE DUCTS: Gallbladder is unremarkable. No biliary ductal dilatation. SPLEEN: No acute abnormality. PANCREAS: No acute abnormality. ADRENAL GLANDS: No acute abnormality. KIDNEYS, URETERS AND BLADDER: No stones in the kidneys or ureters. No hydronephrosis. No perinephric or periureteral stranding. Mildly thick-walled bladder, underdistended. GI AND BOWEL:  Normal appendix (image 92). Stomach demonstrates no acute abnormality. There is no bowel obstruction. REPRODUCTIVE ORGANS: Prostate is unremarkable. PERITONEUM AND RETROPERITONEUM: No ascites. No free air. VASCULATURE: Mild thoracic aortic atherosclerosis. Atherosclerotic calcifications of the abdominal aorta and branch vessels. ABDOMINAL  AND PELVIS LYMPH NODES: No lymphadenopathy. REPRODUCTIVE ORGANS: No acute abnormality. BONES AND SOFT TISSUES: Acute segmental nondisplaced right posterior 11th and 12th rib fractures. Additional old chronic right rib fracture deformities. Mild degenerative changes at L1-2 and L5-S1. IMPRESSION: 1. Acute right posterior 11th and 12th rib fractures, as above. No pneumothorax. 2. Status post esophagectomy with gastric pull through. 3. Additional stable ancillary findings as above. Electronically signed by: Pinkie Pebbles MD 12/09/2023 09:12 PM EDT RP Workstation: [id number]   CT Head Wo Contrast Result Date: 12/09/2023 CLINICAL DATA:  Fall this morning. Dizziness and chills since Thursday. EXAM: CT HEAD WITHOUT CONTRAST CT CERVICAL SPINE WITHOUT CONTRAST TECHNIQUE: Multidetector CT imaging of the head and cervical spine was performed following the standard protocol without intravenous contrast. Multiplanar CT image reconstructions of the cervical spine were also generated. RADIATION DOSE REDUCTION: This exam was performed according to the departmental dose-optimization program which includes automated exposure control, adjustment of the mA and/or kV according to patient size and/or use of iterative reconstruction technique. COMPARISON:  Head CT 03/10/2003, MRI cervical spine 04/26/2021 FINDINGS: CT HEAD FINDINGS Brain: Ventricles, cisterns and other CSF spaces are normal. Minimal chronic ischemic microvascular disease. No mass, mass effect, shift of midline structures or acute hemorrhage. No acute infarction. Vascular: No hyperdense vessel or unexpected calcification. Skull:  Normal. Negative for fracture or focal lesion. Sinuses/Orbits: No acute finding. Other: None. CT CERVICAL SPINE FINDINGS Alignment: No posttraumatic subluxation. Skull base and vertebrae: Vertebral body heights are normal. There is mild spondylosis throughout the cervical spine to include uncovertebral joint spurring and facet arthropathy. Minimal right-sided neural foraminal narrowing at the C2-3 level. Moderate bilateral neural foraminal narrowing at the C3-4 level and C4-5 levels. Left-sided neural foraminal narrowing at the C5-6 level and bilateral neural foraminal narrowing at the C6-7 level. Minimal bilateral neural foraminal narrowing at the C7-T1 level. Several small scattered lucent lesions throughout the vertebral bodies of the visualized cervicothoracic spine. Soft tissues and spinal canal: Prevertebral soft tissues are normal. Spinal canal is unremarkable. Disc levels: Moderate disc space narrowing at the C5-6, C6-7 and C7-T1 levels. Upper chest: Air collection over the right neck base lateral to the trachea at the T1-T3 level with suggestion of surgical sutures along the inferolateral periphery as this is likely postsurgical versus lung herniation. Other: None. IMPRESSION: 1. No acute brain injury. 2. Minimal chronic ischemic microvascular disease. 3. No acute cervical spine injury. 4. Mild spondylosis throughout the cervical spine with multilevel disc disease and neural foraminal narrowing as described. 5. Several small scattered lucent lesions throughout the vertebral bodies of the visualized cervicothoracic spine. These are nonspecific and may be due to osteopenia, however metastatic disease or multiple myeloma is possible. 6. Air collection over the right neck base lateral to the trachea at the T1-T3 level with suggestion of surgical sutures along the inferolateral periphery as this is likely postsurgical versus lung herniation. Recommend correlation with clinical history. Electronically Signed    By: Toribio Agreste M.D.   On: 12/09/2023 18:35   CT Cervical Spine Wo Contrast Result Date: 12/09/2023 CLINICAL DATA:  Fall this morning. Dizziness and chills since Thursday. EXAM: CT HEAD WITHOUT CONTRAST CT CERVICAL SPINE WITHOUT CONTRAST TECHNIQUE: Multidetector CT imaging of the head and cervical spine was performed following the standard protocol without intravenous contrast. Multiplanar CT image reconstructions of the cervical spine were also generated. RADIATION DOSE REDUCTION: This exam was performed according to the departmental dose-optimization program which includes automated exposure control, adjustment of the mA and/or kV  according to patient size and/or use of iterative reconstruction technique. COMPARISON:  Head CT 03/10/2003, MRI cervical spine 04/26/2021 FINDINGS: CT HEAD FINDINGS Brain: Ventricles, cisterns and other CSF spaces are normal. Minimal chronic ischemic microvascular disease. No mass, mass effect, shift of midline structures or acute hemorrhage. No acute infarction. Vascular: No hyperdense vessel or unexpected calcification. Skull: Normal. Negative for fracture or focal lesion. Sinuses/Orbits: No acute finding. Other: None. CT CERVICAL SPINE FINDINGS Alignment: No posttraumatic subluxation. Skull base and vertebrae: Vertebral body heights are normal. There is mild spondylosis throughout the cervical spine to include uncovertebral joint spurring and facet arthropathy. Minimal right-sided neural foraminal narrowing at the C2-3 level. Moderate bilateral neural foraminal narrowing at the C3-4 level and C4-5 levels. Left-sided neural foraminal narrowing at the C5-6 level and bilateral neural foraminal narrowing at the C6-7 level. Minimal bilateral neural foraminal narrowing at the C7-T1 level. Several small scattered lucent lesions throughout the vertebral bodies of the visualized cervicothoracic spine. Soft tissues and spinal canal: Prevertebral soft tissues are normal. Spinal canal is  unremarkable. Disc levels: Moderate disc space narrowing at the C5-6, C6-7 and C7-T1 levels. Upper chest: Air collection over the right neck base lateral to the trachea at the T1-T3 level with suggestion of surgical sutures along the inferolateral periphery as this is likely postsurgical versus lung herniation. Other: None. IMPRESSION: 1. No acute brain injury. 2. Minimal chronic ischemic microvascular disease. 3. No acute cervical spine injury. 4. Mild spondylosis throughout the cervical spine with multilevel disc disease and neural foraminal narrowing as described. 5. Several small scattered lucent lesions throughout the vertebral bodies of the visualized cervicothoracic spine. These are nonspecific and may be due to osteopenia, however metastatic disease or multiple myeloma is possible. 6. Air collection over the right neck base lateral to the trachea at the T1-T3 level with suggestion of surgical sutures along the inferolateral periphery as this is likely postsurgical versus lung herniation. Recommend correlation with clinical history. Electronically Signed   By: Toribio Agreste M.D.   On: 12/09/2023 18:35   DG Chest Port 1 View Result Date: 12/09/2023 CLINICAL DATA:  Chest pain right-sided.  Fall today. EXAM: PORTABLE CHEST 1 VIEW COMPARISON:  06/20/2022 FINDINGS: Lungs are hyperexpanded without focal airspace consolidation or effusion. Cardiomediastinal silhouette is unchanged. 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 Remainder the exam is unchanged. IMPRESSION: 1. No acute cardiopulmonary disease. 2. Postsurgical change compatible with patient's esophagectomy/gastric pull-through procedure. Electronically Signed   By: Toribio Agreste M.D.   On: 12/09/2023 17:50     Procedures   Medications Ordered in the ED  0.9 %  sodium chloride  infusion ( Intravenous New Bag/Given 12/09/23 1919)  LORazepam  (ATIVAN ) injection 0-4 mg ( Intravenous Not Given 12/09/23 1815)     Or  LORazepam  (ATIVAN ) tablet 0-4 mg ( Oral See Alternative 12/09/23 1815)  LORazepam  (ATIVAN ) injection 0-4 mg (has no administration in time range)    Or  LORazepam  (ATIVAN ) tablet 0-4 mg (has no administration in time range)  thiamine  (VITAMIN B1) tablet 100 mg ( Oral See Alternative 12/09/23 1831)    Or  thiamine  (VITAMIN B1) injection 100 mg (100 mg Intravenous Given 12/09/23 1831)  sodium chloride  0.9 % bolus 1,000 mL (0 mLs Intravenous Stopped 12/09/23 1919)  iohexol  (OMNIPAQUE ) 300 MG/ML solution 100 mL (100 mLs Intravenous Contrast Given 12/09/23 2101)  Medical Decision Making Amount and/or Complexity of Data Reviewed Labs: ordered. Radiology: ordered.  Risk OTC drugs. Prescription drug management. Decision regarding hospitalization.  Patient is labs.  Lactic acid 2.1 complete metabolic panel LFTs normal other than AST of 48 renal function normal.  Electrolytes normal.  CBC white count 9.5 hemoglobin 7.3 platelets 179.  Lipase 71 initial troponin 16 delta pending alcohol less than 15 respiratory panel negative.  CT head no acute brain injury.  No acute cervical spine injury.  They raise some question about small scattered lucent lesions throughout the vertebral bodies of the visualized cervical thoracic spine metastatic disease cannot be ruled out but this may be nonspecific.  There is also an air collection over the right neck base lateral to the trachea at T1 T3 with suggestion of surgical sutures.  Not clear if he had any surgery done in that area or not.  Hemoglobin is significantly down compared to 2024.  Will check Hemoccult.  May need to do pan scanning.  Chest x-ray no acute cardiopulmonary disease postsurgical change compatible with patient's esophageal ectomy and gastric pull-through procedure.  Rectal exam stool black in color.  Think there is also a GI bleed as well.  Will see with CT chest abdomen pelvis shows he will need admission  and blood transfusion.  Slightly tachycardic.  But blood pressures are above 90.  Will need blood transfusion as well.  CRITICAL CARE Performed by: Taneal Sonntag Total critical care time: 45 minutes Critical care time was exclusive of separately billable procedures and treating other patients. Critical care was necessary to treat or prevent imminent or life-threatening deterioration. Critical care was time spent personally by me on the following activities: development of treatment plan with patient and/or surrogate as well as nursing, discussions with consultants, evaluation of patient's response to treatment, examination of patient, obtaining history from patient or surrogate, ordering and performing treatments and interventions, ordering and review of laboratory studies, ordering and review of radiographic studies, pulse oximetry and re-evaluation of patient's condition.  Repeat lactic acid 2.2 no significant change from the original 1 of 2.1.  Again clinically not concerned about sepsis.  Patient does have GI bleed Hemoccult was positive.  Hemoglobin is 7.3.  Is a little tachycardic.  Patient does not have a GI doctor.  CT chest abdomen and pelvis acute right posterior 11th and 12th rib fractures probably secondary to the fall.  No pneumothorax status post esophagectomy with gastric pull-through additional stable ancillary findings as above.  Will discuss with on-call GI.  And hospitalist for admission.  [place] with Dr. Dianna Ee GI.  He will see him in consultation.  He is recommending Protonix  IV and n.p.o. after midnight.  Will discuss with hospitalist    Final diagnoses:  Anemia, unspecified type  Melena  Gastrointestinal hemorrhage, unspecified gastrointestinal hemorrhage type  Closed fracture of multiple ribs of right side, initial encounter    ED Discharge Orders     None          Geraldene Hamilton, MD 12/09/23 1741    Geraldene Hamilton, MD 12/09/23 1946     Geraldene Hamilton, MD 12/09/23 1949    Geraldene Hamilton, MD 12/09/23 P[place]A    Geraldene Hamilton, MD 12/09/23 2030    Geraldene Hamilton, MD 12/09/23 2031    Geraldene Hamilton, MD 12/09/23 2119    Geraldene Hamilton, MD 12/09/23 [id number]    Geraldene Hamilton, MD 12/09/23 2142

## 2023-12-09 NOTE — ED Notes (Signed)
 [facility] called for transport.

## 2023-12-09 NOTE — Progress Notes (Signed)
 Plan of Care Note for accepted transfer   Patient: Nathaniel Williams MRN: [id number]   DOA: 12/09/2023  [facility]   Requesting Provider: Dr. Zackowski   Reason for transfer: GI bleeding, symptomatic anemia   Facility course: 63 yr old man with HTN, PUD, alcohol use, and remote esophagectomy who presents lightheadedness and rib pain.   He is found to have increased BUN, Hgb 7.3 (13.2 in October 2024), melena on DRE, and CT findings that include acute fractures of ribs 11 and 12 on the right and several non-specific lucent lesions involving vertebral bodies.   GI (Dr. Dianna) was consulted by the ED physician and patient was treated with IVF and IV Protonix .   Plan of care: The patient is accepted for admission to Progressive unit, at [facility].   Author: Evalene GORMAN Sprinkles, MD 12/09/2023  Check [email or web address] for on-call coverage.  Nursing staff, Please call [facility] Admits & Consults System-Wide number on Amion as soon as patient's arrival, so appropriate admitting provider can evaluate the pt.

## 2023-12-10 ENCOUNTER — Encounter (HOSPITAL_COMMUNITY): Payer: Self-pay | Admitting: Family Medicine

## 2023-12-10 ENCOUNTER — Inpatient Hospital Stay (HOSPITAL_COMMUNITY)

## 2023-12-10 DIAGNOSIS — D649 Anemia, unspecified: Secondary | ICD-10-CM | POA: Diagnosis not present

## 2023-12-10 DIAGNOSIS — F1090 Alcohol use, unspecified, uncomplicated: Secondary | ICD-10-CM

## 2023-12-10 DIAGNOSIS — Z860101 Personal history of adenomatous and serrated colon polyps: Secondary | ICD-10-CM

## 2023-12-10 DIAGNOSIS — R42 Dizziness and giddiness: Secondary | ICD-10-CM

## 2023-12-10 DIAGNOSIS — I351 Nonrheumatic aortic (valve) insufficiency: Secondary | ICD-10-CM

## 2023-12-10 LAB — CBC
HCT: 19.4 % — ABNORMAL LOW (ref 39.0–52.0)
Hemoglobin: 6.1 g/dL — CL (ref 13.0–17.0)
MCH: 29.8 pg (ref 26.0–34.0)
MCHC: 31.4 g/dL (ref 30.0–36.0)
MCV: 94.6 fL (ref 80.0–100.0)
Platelets: 150 K/uL (ref 150–400)
RBC: 2.05 MIL/uL — ABNORMAL LOW (ref 4.22–5.81)
RDW: 12.9 % (ref 11.5–15.5)
WBC: 7 K/uL (ref 4.0–10.5)
nRBC: 0 % (ref 0.0–0.2)

## 2023-12-10 LAB — HEMOGLOBIN AND HEMATOCRIT, BLOOD
HCT: 29 % — ABNORMAL LOW (ref 39.0–52.0)
Hemoglobin: 9.5 g/dL — ABNORMAL LOW (ref 13.0–17.0)

## 2023-12-10 LAB — ECHOCARDIOGRAM COMPLETE
AV Vena cont: 0.7 cm
Area-P 1/2: 3.16 cm2
Height: 67 in
P 1/2 time: 274 ms
S' Lateral: 3.5 cm
Single Plane A2C EF: 45.1 %
Weight: 1760 [oz_av]

## 2023-12-10 LAB — PREPARE RBC (CROSSMATCH)

## 2023-12-10 LAB — HIV ANTIBODY (ROUTINE TESTING W REFLEX): HIV Screen 4th Generation wRfx: NONREACTIVE

## 2023-12-10 LAB — BASIC METABOLIC PANEL WITH GFR
Anion gap: 8 (ref 5–15)
BUN: 26 mg/dL — ABNORMAL HIGH (ref 8–23)
CO2: 24 mmol/L (ref 22–32)
Calcium: 8.3 mg/dL — ABNORMAL LOW (ref 8.9–10.3)
Chloride: 107 mmol/L (ref 98–111)
Creatinine, Ser: 0.73 mg/dL (ref 0.61–1.24)
GFR, Estimated: >60 mL/min (ref >60)
Glucose, Bld: 113 mg/dL — ABNORMAL HIGH (ref 70–99)
Potassium: 3.9 mmol/L (ref 3.5–5.1)
Sodium: 139 mmol/L (ref 135–145)

## 2023-12-10 MED ORDER — ACETAMINOPHEN 325 MG PO TABS
650.0000 mg | ORAL_TABLET | Freq: Four times a day (QID) | ORAL | Status: DC | PRN
  Administered 2023-12-10: 650 mg via ORAL
  Filled 2023-12-10: qty 2

## 2023-12-10 MED ORDER — ONDANSETRON HCL 4 MG PO TABS: 4.0000 mg | ORAL_TABLET | Freq: Four times a day (QID) | ORAL | Status: DC | PRN

## 2023-12-10 MED ORDER — ACETAMINOPHEN 650 MG RE SUPP: 650.0000 mg | Freq: Four times a day (QID) | RECTAL | Status: DC | PRN

## 2023-12-10 MED ORDER — OXYCODONE HCL 5 MG PO TABS
5.0000 mg | ORAL_TABLET | Freq: Four times a day (QID) | ORAL | Status: DC | PRN
  Administered 2023-12-10 – 2023-12-12 (×6): 5 mg via ORAL
  Filled 2023-12-10 (×6): qty 1

## 2023-12-10 MED ORDER — LIDOCAINE 5 % EX PTCH
1.0000 | MEDICATED_PATCH | CUTANEOUS | Status: DC
  Administered 2023-12-10 – 2023-12-11 (×2): 1 via TRANSDERMAL
  Filled 2023-12-10 (×2): qty 1

## 2023-12-10 MED ORDER — ONDANSETRON HCL 4 MG/2ML IJ SOLN: 4.0000 mg | Freq: Four times a day (QID) | INTRAMUSCULAR | Status: DC | PRN

## 2023-12-10 MED ORDER — SODIUM CHLORIDE 0.9 % IV SOLN: INTRAVENOUS | Status: DC

## 2023-12-10 MED ORDER — ALBUTEROL SULFATE (2.5 MG/3ML) 0.083% IN NEBU: 2.5000 mg | INHALATION_SOLUTION | RESPIRATORY_TRACT | Status: DC | PRN

## 2023-12-10 MED ORDER — SODIUM CHLORIDE 0.9 % IV SOLN: INTRAVENOUS | Status: AC

## 2023-12-10 MED ORDER — SODIUM CHLORIDE 0.9% IV SOLUTION: Freq: Once | INTRAVENOUS | Status: AC

## 2023-12-10 NOTE — Progress Notes (Signed)
   12/10/23 0917  TOC Brief Assessment  Insurance and Status Reviewed  Patient has primary care physician Yes  Home environment has been reviewed Resides at [facility]  Prior level of function: Independent with ADLs at baseline  Prior/Current Home Services No current home services  Social Drivers of Health Review SDOH reviewed no interventions necessary  Readmission risk has been reviewed Yes  Transition of care needs no transition of care needs at this time

## 2023-12-10 NOTE — Plan of Care (Signed)

## 2023-12-10 NOTE — Progress Notes (Signed)
  Echocardiogram 2D Echocardiogram has been performed.  Koleen KANDICE Popper, RDCS 12/10/2023, 1:15 PM

## 2023-12-10 NOTE — Consult Note (Addendum)
 Referring Provider:  Dr. Raenelle, TRH Primary Care Physician:  Dorina Dallas RIGGERS Primary Gastroenterologist:  Dr. Albertus  Reason for Consultation:  Melena, anemia  HPI: Norville Pepitone is a 63 y.o. male with a history of bleeding gastric ulcer in 2022 due to NSAID use, achalasia status post partial esophageal resection with gastric pull-through.  He presented to the hospital with complaints of dizziness, lightheadedness, and fall on 9/21.  Has been having black tarry stool for about 5 days.  Says that he had no GI issues or complaints until this all began last Thursday.  Not taking NSAIDs.  Does drink ETOH in the form of liquor, a couple of drinks daily.  Has not been on any PPI as no GI complaints.  No heartburn/reflux.  No dysphagia.  No abdominal pain.  Says that he has lost about 10-15 pounds in the past year but not sure why.  No further BM/black stool since his admission.  BUN elevated at 36->26.  Hgb 6.1 grams down from 13.1 grams one year ago.  FOBT positive.  Has received 2 units of packed red blood cells.  CT chest/abdomen/pelvis with contrast 12/09/2023 with rib fractures from fall but no GI findings.  Colonoscopy 04/2021:  - Two 2 and 7 mm polyps in the ascending colon, removed with a cold snare. Resected and retrieved. - Internal hemorrhoids. - The examination was otherwise normal.  EGD 04/2021:  - An esophagogastric anastomosis was found in upper esophagus. - Z- line irregular, 22 cm from the incisors. Biopsied. - Normal stomach. Gastric ulcer has healed with medical therapy. - Normal examined duodenum.  1. Surgical [P], irregular z line at 22 - CARDIAC MUCOSA WITH MILD CHRONIC NONSPECIFIC CARDITIS - NEGATIVE FOR INTESTINAL METAPLASIA OR DYSPLASIA 2. Surgical [P], colon, ascending, polyp (2) - TUBULAR ADENOMA WITHOUT HIGH-GRADE DYSPLASIA OR MALIGNANCY - SESSILE SERRATED POLYP(S) WITHOUT CYTOLOGIC DYSPLASIA  EGD 10/2020:  - An esophago- gastric anastomosis was found  at 22 cm ( previous subtotal esophagectomy with gastric pull- through. Z- line irregular. - Non- bleeding gastric ulcer with small pigmented spot. Clip ( MR conditional) placement unsuccessful. - Gastritis. Biopsied. - Normal examined duodenum.  A. STOMACH, BIOPSY:  - Reactive gastropathy.  - Warthin-Starry is negative for Helicobacter pylori.  - No intestinal metaplasia, dysplasia, or malignancy.   Past Medical History:  Diagnosis Date   Achalasia 1996   Dx in [place] s/p Heller Myotomy w/gastric pull-thru   Alcohol use    Aspiration pneumonia (HCC)    Cervical spondylosis    Gastric ulcer    Gastritis    GI bleed    H/O hydrocele    of scrotum   Hypertension    IDA (iron deficiency anemia)     Past Surgical History:  Procedure Laterality Date   BIOPSY  10/21/2020   Procedure: BIOPSY;  Surgeon: Albertus Gordy HERO, MD;  Location: [facility] ENDOSCOPY;  Service: Gastroenterology;;   ESOPHAGOGASTRODUODENOSCOPY (EGD) WITH PROPOFOL  N/A 10/21/2020   Procedure: ESOPHAGOGASTRODUODENOSCOPY (EGD) WITH PROPOFOL ;  Surgeon: Albertus Gordy HERO, MD;  Location: [facility] ENDOSCOPY;  Service: Gastroenterology;  Laterality: N/A;   HELLER MYOTOMY  1996   in [place]; w/gastric pull thru   HEMOSTASIS CLIP PLACEMENT  10/21/2020   Procedure: HEMOSTASIS CLIP PLACEMENT;  Surgeon: Albertus Gordy HERO, MD;  Location: [facility] ENDOSCOPY;  Service: Gastroenterology;;    Prior to Admission medications   Medication Sig Start Date End Date Taking? Authorizing Provider  Ascorbic Acid (VITAMIN C) 1000 MG tablet Take 1,000 mg by mouth daily.  Patient not taking: Reported on 12/10/2023    [provider]  B Complex-C (SUPER B COMPLEX PO) Take 1 tablet by mouth daily. Patient not taking: Reported on 12/10/2023    [provider]  Camphor-Menthol-Methyl Sal (TIGER BALM MUSCLE RUB EX) Apply 1 application. topically as needed (back pain). Patient not taking: Reported on 12/10/2023    [provider]  Cholecalciferol  (VITAMIN D3) 50 MCG (2000 UT) capsule Take 2,000 Units by mouth daily. Patient not taking: Reported on 12/10/2023    [provider]  clobetasol cream (TEMOVATE) 0.05 % Apply 1 Application topically daily. Patient not taking: Reported on 12/10/2023 10/06/22   [provider]  gabapentin  (NEURONTIN ) 100 MG capsule Take 1 capsule (100 mg total) by mouth every evening. Patient not taking: Reported on 12/10/2023 04/05/22     hydrALAZINE  (APRESOLINE ) 50 MG tablet Take 1 tablet (50 mg total) by mouth 3 (three) times daily. Patient not taking: Reported on 12/10/2023 11/14/22   Saguier, [place], PA-C  lidocaine  (LIDODERM ) 5 % Place 1 patch onto the skin daily as needed (back pain). Remove & Discard patch within 12 hours or as directed by MD Patient not taking: Reported on 12/10/2023    [provider]  lisinopril  (ZESTRIL ) 40 MG tablet Take 1 tablet (40 mg total) by mouth daily. Patient not taking: Reported on 12/10/2023 04/18/22   Saguier, [place], PA-C  metoprolol  succinate (TOPROL -XL) 50 MG 24 hr tablet Take 1 tablet (50 mg total) by mouth daily. TAKE WITH OR IMMEDIATELY FOLLOWING A MEAL. 12/10/20 10/27/22  Saguier, [place], PA-C  metoprolol  succinate (TOPROL -XL) 50 MG 24 hr tablet Take 1 tablet (50 mg total) by mouth daily. Take with or immediately following a meal. Patient not taking: Reported on 12/10/2023 01/11/22   Saguier, [place], PA-C  Misc Natural Products (OSTEO BI-FLEX ADV TRIPLE ST PO) Take 1 tablet by mouth daily. Patient not taking: Reported on 12/10/2023    [provider]  ondansetron  (ZOFRAN -ODT) 4 MG disintegrating tablet Take 1 tablet (4 mg total) by mouth every 8 (eight) hours as needed. Patient not taking: Reported on 12/10/2023 06/21/22   Long, Fonda MATSU, MD  OVER THE COUNTER MEDICATION Take 1 tablet by mouth daily. [employer] AND METABOBLISM Patient not taking: Reported on 12/10/2023    [provider]  pantoprazole  (PROTONIX ) 40 MG tablet Take 1  tablet (40 mg total) by mouth daily. Patient not taking: Reported on 12/10/2023 10/27/22   Saguier, [place], PA-C  PARoxetine  (PAXIL ) 10 MG tablet Take 1 tablet (10 mg total) by mouth daily. Patient not taking: Reported on 12/10/2023 10/27/22   Saguier, [place], PA-C  polyvinyl alcohol (LIQUIFILM TEARS) 1.4 % ophthalmic solution Place 1 drop into both eyes as needed for dry eyes. Patient not taking: Reported on 12/10/2023    [provider]  potassium chloride  SA (KLOR-CON  M) 20 MEQ tablet Take 2 tablets (40 mEq total) by mouth daily for 5 days. 06/21/22 10/27/22  Long, Joshua G, MD  senna-docusate (SENOKOT-S) 8.6-50 MG tablet Take 1 tablet by mouth at bedtime as needed for mild to moderate constipation. Patient not taking: Reported on 12/10/2023 08/29/22   Saguier, [place], PA-C  Turmeric Curcumin 500 MG CAPS Take 1 capsule by mouth daily. Patient not taking: Reported on 12/10/2023    [provider]  vitamin E 180 MG (400 UNITS) capsule Take 400 Units by mouth daily. Patient not taking: Reported on 12/10/2023    [provider]    Current Facility-Administered  Medications  Medication Dose Route Frequency Provider Last Rate Last Admin   0.9 %  sodium chloride  infusion   Intravenous Continuous Debby Camila LABOR, MD 75 mL/hr at 12/10/23 0904 New Bag at 12/10/23 0904   acetaminophen  (TYLENOL ) tablet 650 mg  650 mg Oral Q6H PRN Debby Camila LABOR, MD       Or   acetaminophen  (TYLENOL ) suppository 650 mg  650 mg Rectal Q6H PRN Debby Camila LABOR, MD       albuterol  (PROVENTIL ) (2.5 MG/3ML) 0.083% nebulizer solution 2.5 mg  2.5 mg Nebulization Q2H PRN Debby Camila LABOR, MD       lidocaine  (LIDODERM ) 5 % 1 patch  1 patch Transdermal Q24H Raenelle Coria, MD       LORazepam  (ATIVAN ) injection 0-4 mg  0-4 mg Intravenous Q6H Zackowski, Scott, MD   2 mg at 12/09/23 2348   Or   LORazepam  (ATIVAN ) tablet 0-4 mg  0-4 mg Oral Q6H Zackowski, Scott, MD       NOREEN ON 12/12/2023] LORazepam   (ATIVAN ) injection 0-4 mg  0-4 mg Intravenous Q12H Zackowski, Scott, MD       Or   NOREEN ON 12/12/2023] LORazepam  (ATIVAN ) tablet 0-4 mg  0-4 mg Oral Q12H Zackowski, Scott, MD       ondansetron  (ZOFRAN ) tablet 4 mg  4 mg Oral Q6H PRN Debby Camila LABOR, MD       Or   ondansetron  (ZOFRAN ) injection 4 mg  4 mg Intravenous Q6H PRN Debby Camila LABOR, MD       pantoprazole  (PROTONIX ) injection 40 mg  40 mg Intravenous Q12H Zackowski, Scott, MD   40 mg at 12/10/23 9094   thiamine  (VITAMIN B1) tablet 100 mg  100 mg Oral Daily Zackowski, Scott, MD       Or   thiamine  (VITAMIN B1) injection 100 mg  100 mg Intravenous Daily Zackowski, Scott, MD   100 mg at 12/09/23 1831    Allergies as of 12/09/2023   (No Known Allergies)    Family History  Problem Relation Age of Onset   Cervical cancer Mother    Alzheimer's disease Father    Stomach cancer Neg Hx    Esophageal cancer Neg Hx    Pancreatic cancer Neg Hx     Social History   Socioeconomic History   Marital status: Legally Separated    Spouse name: Not on file   Number of children: Not on file   Years of education: Not on file   Highest education level: Not on file  Occupational History   Not on file  Tobacco Use   Smoking status: Former    Current packs/day: 0.25    Average packs/day: 0.3 packs/day for 18.0 years (4.5 ttl pk-yrs)    Types: Cigarettes   Smokeless tobacco: Never  Vaping Use   Vaping status: Never Used  Substance and Sexual Activity   Alcohol use: Not Currently    Comment: 1.5 L rum every 10 days (per hospital record 10/21/20)   Drug use: Never   Sexual activity: Not on file  Other Topics Concern   Not on file  Social History Narrative   Not on file   Social Drivers of Health   Financial Resource Strain: Not on file  Food Insecurity: No Food Insecurity (12/09/2023)   Hunger Vital Sign    Worried About Running Out of Food in the Last Year: Never true    Ran Out of Food in the Last Year: Never true  Transportation Needs: No Transportation Needs (12/09/2023)   PRAPARE - Administrator, Civil Service (Medical): No    Lack of Transportation (Non-Medical): No  Physical Activity: Not on file  Stress: Not on file  Social Connections: Unknown (08/02/2021)   Received [facility]   Social Network    Social Network: Not on file  Intimate Partner Violence: Not At Risk (12/09/2023)   Humiliation, Afraid, Rape, and Kick questionnaire    Fear of Current or Ex-Partner: No    Emotionally Abused: No    Physically Abused: No    Sexually Abused: No    Review of Systems: ROS is O/W negative except as mentioned in HPI.  Physical Exam: Vital signs in last 24 hours: Temp:  [97.8 F (36.6 C)-99.7 F (37.6 C)] 98.5 F (36.9 C) (09/22 1103) Pulse Rate:  [80-104] 80 (09/22 1103) Resp:  [15-20] 17 (09/22 1103) BP: (99-140)/(55-83) 124/72 (09/22 1103) SpO2:  [97 %-100 %] 100 % (09/22 1103) Weight:  [49.9 kg-72.6 kg] 49.9 kg (09/21 2333)   General:  Alert, thin, pleasant and cooperative in NAD Head:  Normocephalic and atraumatic. Eyes:  Sclera clear, no icterus.  Conjunctiva pink. Ears:  Normal auditory acuity. Mouth:  No deformity or lesions.   Lungs:  Clear throughout to auscultation.  No wheezes, crackles, or rhonchi.  Heart:  Regular rate and rhythm; no murmurs, clicks, rubs, or gallops. Abdomen:  Soft, non-distended.  BS present.  Non-tender.   Rectal:  Hemoccult positive in the ED.  Msk:  Symmetrical without gross deformities. Pulses:  Normal pulses noted. Extremities:  Without clubbing or edema. Neurologic:  Alert and oriented x 4;  grossly normal neurologically. Skin:  Intact without significant lesions or rashes. Psych:  Alert and cooperative. Normal mood and affect.  Intake/Output from previous day: 09/21 0701 - 09/22 0700 In: 2197.1 [I.V.:1197.1; IV Piggyback:1000] Out: 325 [Urine:325] Intake/Output this shift: Total I/O In: 402 [Blood:402] Out: -   Lab  Results: Recent Labs    12/09/23 1742 12/10/23 0108  WBC 9.5 7.0  HGB 7.3* 6.1*  HCT 21.5* 19.4*  PLT 179 150   BMET Recent Labs    12/09/23 1742 12/10/23 0108  NA 136 139  K 4.0 3.9  CL 102 107  CO2 23 24  GLUCOSE 131* 113*  BUN 36* 26*  CREATININE 0.78 0.73  CALCIUM 8.5* 8.3*   LFT Recent Labs    12/09/23 1742  PROT 5.8*  ALBUMIN 3.8  AST 48*  ALT 31  ALKPHOS 46  BILITOT 0.3   Studies/Results: CT CHEST ABDOMEN PELVIS W CONTRAST Result Date: 12/09/2023 EXAM: CT CHEST, ABDOMEN AND PELVIS WITH CONTRAST 12/09/2023 09:01:00 PM TECHNIQUE: CT of the chest, abdomen and pelvis was performed with the administration of intravenous contrast. Multiplanar reformatted images are provided for review. Automated exposure control, iterative reconstruction, and/or weight based adjustment of the mA/kV was utilized to reduce the radiation dose to as low as reasonably achievable. COMPARISON: CT abdomen/pelvis dated 03/07/2018 and CT chest dated 04/21/2004. CLINICAL HISTORY: Respiratory illness, nondiagnostic xray. Pt states that he has had some dizziness and chills since Thursday. Family states that pt is out of his blood pressure medication. Pt also states pain in his ribs. Denies cough, fever. Pt states that he fell this morning after he stood up. Hx of ETOH. Last drank Wednesday. FINDINGS: CHEST: MEDIASTINUM AND LYMPH NODES: Status post esophagectomy with gastric pull-through. No mediastinal, hilar or axillary lymphadenopathy. LUNGS AND PLEURA: Scattered peribronchovascular nodularity including chronic nodularity  in the right upper lobe measuring up to 6 mm (image 35), benign. Bronchiectasis particularly in the inferior right upper lobe and superior segment left lower lobe, chronic. No new/suspicious pulmonary nodules. No pleural effusion or pneumothorax. ABDOMEN AND PELVIS: LIVER: The liver is unremarkable. GALLBLADDER AND BILE DUCTS: Gallbladder is unremarkable. No biliary ductal dilatation.  SPLEEN: No acute abnormality. PANCREAS: No acute abnormality. ADRENAL GLANDS: No acute abnormality. KIDNEYS, URETERS AND BLADDER: No stones in the kidneys or ureters. No hydronephrosis. No perinephric or periureteral stranding. Mildly thick-walled bladder, underdistended. GI AND BOWEL: Normal appendix (image 92). Stomach demonstrates no acute abnormality. There is no bowel obstruction. REPRODUCTIVE ORGANS: Prostate is unremarkable. PERITONEUM AND RETROPERITONEUM: No ascites. No free air. VASCULATURE: Mild thoracic aortic atherosclerosis. Atherosclerotic calcifications of the abdominal aorta and branch vessels. ABDOMINAL AND PELVIS LYMPH NODES: No lymphadenopathy. REPRODUCTIVE ORGANS: No acute abnormality. BONES AND SOFT TISSUES: Acute segmental nondisplaced right posterior 11th and 12th rib fractures. Additional old chronic right rib fracture deformities. Mild degenerative changes at L1-2 and L5-S1. IMPRESSION: 1. Acute right posterior 11th and 12th rib fractures, as above. No pneumothorax. 2. Status post esophagectomy with gastric pull through. 3. Additional stable ancillary findings as above. Electronically signed by: Pinkie Pebbles MD 12/09/2023 09:12 PM EDT RP Workstation: [id number]   CT Head Wo Contrast Result Date: 12/09/2023 CLINICAL DATA:  Fall this morning. Dizziness and chills since Thursday. EXAM: CT HEAD WITHOUT CONTRAST CT CERVICAL SPINE WITHOUT CONTRAST TECHNIQUE: Multidetector CT imaging of the head and cervical spine was performed following the standard protocol without intravenous contrast. Multiplanar CT image reconstructions of the cervical spine were also generated. RADIATION DOSE REDUCTION: This exam was performed according to the departmental dose-optimization program which includes automated exposure control, adjustment of the mA and/or kV according to patient size and/or use of iterative reconstruction technique. COMPARISON:  Head CT 03/10/2003, MRI cervical spine 04/26/2021 FINDINGS:  CT HEAD FINDINGS Brain: Ventricles, cisterns and other CSF spaces are normal. Minimal chronic ischemic microvascular disease. No mass, mass effect, shift of midline structures or acute hemorrhage. No acute infarction. Vascular: No hyperdense vessel or unexpected calcification. Skull: Normal. Negative for fracture or focal lesion. Sinuses/Orbits: No acute finding. Other: None. CT CERVICAL SPINE FINDINGS Alignment: No posttraumatic subluxation. Skull base and vertebrae: Vertebral body heights are normal. There is mild spondylosis throughout the cervical spine to include uncovertebral joint spurring and facet arthropathy. Minimal right-sided neural foraminal narrowing at the C2-3 level. Moderate bilateral neural foraminal narrowing at the C3-4 level and C4-5 levels. Left-sided neural foraminal narrowing at the C5-6 level and bilateral neural foraminal narrowing at the C6-7 level. Minimal bilateral neural foraminal narrowing at the C7-T1 level. Several small scattered lucent lesions throughout the vertebral bodies of the visualized cervicothoracic spine. Soft tissues and spinal canal: Prevertebral soft tissues are normal. Spinal canal is unremarkable. Disc levels: Moderate disc space narrowing at the C5-6, C6-7 and C7-T1 levels. Upper chest: Air collection over the right neck base lateral to the trachea at the T1-T3 level with suggestion of surgical sutures along the inferolateral periphery as this is likely postsurgical versus lung herniation. Other: None. IMPRESSION: 1. No acute brain injury. 2. Minimal chronic ischemic microvascular disease. 3. No acute cervical spine injury. 4. Mild spondylosis throughout the cervical spine with multilevel disc disease and neural foraminal narrowing as described. 5. Several small scattered lucent lesions throughout the vertebral bodies of the visualized cervicothoracic spine. These are nonspecific and may be due to osteopenia, however metastatic disease or multiple myeloma is  possible.  6. Air collection over the right neck base lateral to the trachea at the T1-T3 level with suggestion of surgical sutures along the inferolateral periphery as this is likely postsurgical versus lung herniation. Recommend correlation with clinical history. Electronically Signed   By: Toribio Agreste M.D.   On: 12/09/2023 18:35   CT Cervical Spine Wo Contrast Result Date: 12/09/2023 CLINICAL DATA:  Fall this morning. Dizziness and chills since Thursday. EXAM: CT HEAD WITHOUT CONTRAST CT CERVICAL SPINE WITHOUT CONTRAST TECHNIQUE: Multidetector CT imaging of the head and cervical spine was performed following the standard protocol without intravenous contrast. Multiplanar CT image reconstructions of the cervical spine were also generated. RADIATION DOSE REDUCTION: This exam was performed according to the departmental dose-optimization program which includes automated exposure control, adjustment of the mA and/or kV according to patient size and/or use of iterative reconstruction technique. COMPARISON:  Head CT 03/10/2003, MRI cervical spine 04/26/2021 FINDINGS: CT HEAD FINDINGS Brain: Ventricles, cisterns and other CSF spaces are normal. Minimal chronic ischemic microvascular disease. No mass, mass effect, shift of midline structures or acute hemorrhage. No acute infarction. Vascular: No hyperdense vessel or unexpected calcification. Skull: Normal. Negative for fracture or focal lesion. Sinuses/Orbits: No acute finding. Other: None. CT CERVICAL SPINE FINDINGS Alignment: No posttraumatic subluxation. Skull base and vertebrae: Vertebral body heights are normal. There is mild spondylosis throughout the cervical spine to include uncovertebral joint spurring and facet arthropathy. Minimal right-sided neural foraminal narrowing at the C2-3 level. Moderate bilateral neural foraminal narrowing at the C3-4 level and C4-5 levels. Left-sided neural foraminal narrowing at the C5-6 level and bilateral neural foraminal  narrowing at the C6-7 level. Minimal bilateral neural foraminal narrowing at the C7-T1 level. Several small scattered lucent lesions throughout the vertebral bodies of the visualized cervicothoracic spine. Soft tissues and spinal canal: Prevertebral soft tissues are normal. Spinal canal is unremarkable. Disc levels: Moderate disc space narrowing at the C5-6, C6-7 and C7-T1 levels. Upper chest: Air collection over the right neck base lateral to the trachea at the T1-T3 level with suggestion of surgical sutures along the inferolateral periphery as this is likely postsurgical versus lung herniation. Other: None. IMPRESSION: 1. No acute brain injury. 2. Minimal chronic ischemic microvascular disease. 3. No acute cervical spine injury. 4. Mild spondylosis throughout the cervical spine with multilevel disc disease and neural foraminal narrowing as described. 5. Several small scattered lucent lesions throughout the vertebral bodies of the visualized cervicothoracic spine. These are nonspecific and may be due to osteopenia, however metastatic disease or multiple myeloma is possible. 6. Air collection over the right neck base lateral to the trachea at the T1-T3 level with suggestion of surgical sutures along the inferolateral periphery as this is likely postsurgical versus lung herniation. Recommend correlation with clinical history. Electronically Signed   By: Toribio Agreste M.D.   On: 12/09/2023 18:35   DG Chest Port 1 View Result Date: 12/09/2023 CLINICAL DATA:  Chest pain right-sided.  Fall today. EXAM: PORTABLE CHEST 1 VIEW COMPARISON:  06/20/2022 FINDINGS: Lungs are hyperexpanded without focal airspace consolidation or effusion. Cardiomediastinal silhouette is unchanged. 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 Remainder the exam is unchanged. IMPRESSION: 1. No acute cardiopulmonary disease. 2. Postsurgical change compatible with patient's esophagectomy/gastric  pull-through procedure. Electronically Signed   By: Toribio Agreste M.D.   On: 12/09/2023 17:50   IMPRESSION:  63 year old male with history of NSAID induced ulcer in 2022.  Presented with black stools x 4-5 days, dizziness, lightheadedness.  BUN elevated.  Hemoglobin 6.1 g.  Has received 2 units of packed red blood cells.  Hemoccult positive.  No NSAIDs currently.  Does drink ETOH daily, usually two liquor drinks a day.  Liver ok on CT scan and no history of liver disease.  AST just slightly elevated.  ? ETOH induced gastritis.  Weight loss:  He reports 10-15 pounds weight loss over the past year.  No GI complaints until this all occurred last week.  CT spine suggest some possible metastatic lesions vs multiple myeloma?  PLAN: - Pantoprazole  40 mg IV twice daily. - EGD on 9/23. -Monitor Hgb and transfuse further prn. -Will allow clear liquids this evening then NPO after midnight. -Likely needs to decrease ETOH consumption.   Harlene BIRCH. Zehr  12/10/2023, 12:59 PM    Attending physician's note   I have taken history, reviewed the chart and examined the patient. I performed a substantive portion of this encounter, including complete performance of at least one of the key components, in conjunction with the APP. I agree with the Advanced Practitioner's note, impression and recommendations.   Symptomatic anemia with H+ stools. H/O melena (resolved). Hb 6 (baseline 13, 2024) s/p 2U PRBC. Neg CT C/A/P yesterday w/t contrast except for rib fractures after fall. Neg EGD/colon Feb 2023 except for healed gastric ulcer, diminutive colonic polyps. No NSAIDs.   H/O NSAID induced ulcer 10/2020. Healed on FU EGD 04/2021  Achalasia s/p esophagectomy with gastric pull through.  No dysphagia.  H/O wt loss (15lb over last 1 yr)  Plan: - SBE in AM. If neg, would consider VCE. Elevated BUN s/o UGI bleed. - Trend CBC. Keep Hb>7 - IV Protonix . - Stop all ETOH.  Discussed risks and benefits with the patient  in detail. CT A/P reviewed independently. No cirrhosis.   Anselm Bring, MD [facility] [phone]

## 2023-12-10 NOTE — Progress Notes (Signed)
 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  Black tarry stool since last 5 days.  In the emergency room hemodynamically stable.  Hemoglobin 7.3, 13.1 last year.  Platelets adequate.  Lipase normal.  Electrolytes are adequate.  Admitted with acute GI bleeding.  Also found to have right 11th and 12th rib fracture.  Subjective: Patient seen and examined.  For hemoglobin 6.1, he had received 1 unit of PRBC overnight.  I ordered another unit of PRBC given patient is symptomatic.  Patient denies any nausea vomiting.  No bowel movement since coming to the hospital.  Feels very weak.  Right side of the chest hurts.  No evidence of alcohol withdrawal so far.  Assessment & Plan:   Acute upper GI bleeding, anemia of acute blood loss, symptomatic anemia: 1 unit PRBC overnight-additional unit now.  Currently hemodynamically stable.  Recheck hemoglobin after the blood transfusion.  Continue PPI, close monitoring of hemoglobin and vitals.  GI consulted.  Anticipate upper GI endoscopy.  Essential hypertension: Blood pressure stable.  Risk of hypotension.  Holding all antihypertensive medications.  Alcohol use disorder: High risk of withdrawals.  Currently uncomplicated.  Placed on CIWA protocol with benzodiazepine.  Continue multivitamins, thiamine  and folic acid .  Right posterior rib fractures: Incentive spirometry.  Continue supportive care.  Lidocaine  patch.    DVT prophylaxis: SCDs Start: 12/09/23 2357   Code Status: Full code Family Communication: None at the bedside Disposition Plan: Status is: Inpatient Remains inpatient  appropriate because: Blood transfusions, inpatient procedures     Consultants:  Gastroenterology  Procedures:  None  Antimicrobials:  None     Objective: Vitals:   12/10/23 0745 12/10/23 0810 12/10/23 1045 12/10/23 1103  BP: 128/76 (!) 140/83 129/72 124/72  Pulse: 81 83 80 80  Resp: 18 16 17 17   Temp: 98.8 F (37.1 C) 98.4 F (36.9 C) 98.5 F (36.9 C) 98.5 F (36.9 C)  TempSrc: Oral  Oral Oral  SpO2: 100% 100% 99% 100%  Weight:      Height:        Intake/Output Summary (Last 24 hours) at 12/10/2023 1241 Last data filed at 12/10/2023 0900 Gross per 24 hour  Intake 2599.08 ml  Output 325 ml  Net 2274.08 ml   Filed Weights   12/09/23 1706 12/09/23 2333  Weight: 72.6 kg 49.9 kg    Examination:  General exam: Appears calm and comfortable.  Pleasant and interactive. Respiratory system: Clear to auscultation. Respiratory effort normal.  Mild tenderness on the right lower chest wall with deep breathing. Cardiovascular system: S1 & S2 heard, RRR. No JVD, murmurs, rubs, gallops or clicks. No pedal edema. Gastrointestinal system: Abdomen is nondistended, soft and nontender. No organomegaly or masses felt. Normal bowel sounds heard. Central nervous system: Alert and oriented. No focal neurological deficits. Extremities: Symmetric 5 x 5 power. Skin: No rashes, lesions or ulcers Psychiatry: Judgement and insight appear normal. Mood & affect appropriate.     Data Reviewed: I have personally reviewed following labs and imaging studies  CBC: Recent Labs  Lab 12/09/23 1742 12/10/23 0108  WBC 9.5 7.0  HGB 7.3* 6.1*  HCT 21.5* 19.4*  MCV 90.7 94.6  PLT 179  150   Basic Metabolic Panel: Recent Labs  Lab 12/09/23 1742 12/10/23 0108  NA 136 139  K 4.0 3.9  CL 102 107  CO2 23 24  GLUCOSE 131* 113*  BUN 36* 26*  CREATININE 0.78 0.73  CALCIUM 8.5* 8.3*   GFR: Estimated Creatinine Clearance: 65.8 mL/min (by C-G formula based on SCr of 0.73 mg/dL). Liver  Function Tests: Recent Labs  Lab 12/09/23 1742  AST 48*  ALT 31  ALKPHOS 46  BILITOT 0.3  PROT 5.8*  ALBUMIN 3.8   Recent Labs  Lab 12/09/23 1742  LIPASE 21   No results for input(s): AMMONIA in the last 168 hours. Coagulation Profile: No results for input(s): INR, PROTIME in the last 168 hours. Cardiac Enzymes: No results for input(s): CKTOTAL, CKMB, CKMBINDEX, TROPONINI in the last 168 hours. BNP (last 3 results) No results for input(s): PROBNP in the last 8760 hours. HbA1C: No results for input(s): HGBA1C in the last 72 hours. CBG: No results for input(s): GLUCAP in the last 168 hours. Lipid Profile: No results for input(s): CHOL, HDL, LDLCALC, TRIG, CHOLHDL, LDLDIRECT in the last 72 hours. Thyroid Function Tests: No results for input(s): TSH, T4TOTAL, FREET4, T3FREE, THYROIDAB in the last 72 hours. Anemia Panel: No results for input(s): VITAMINB12, FOLATE, FERRITIN, TIBC, IRON, RETICCTPCT in the last 72 hours. Sepsis Labs: Recent Labs  Lab 12/09/23 1743 12/09/23 1930  LATICACIDVEN 2.1* 2.2*    Recent Results (from the past 240 hours)  Resp panel by RT-PCR (RSV, Flu A&B, Covid) Anterior Nasal Swab     Status: None   Collection Time: 12/09/23  5:09 PM   Specimen: Anterior Nasal Swab  Result Value Ref Range Status   SARS Coronavirus 2 by RT PCR NEGATIVE NEGATIVE Final    Comment: (NOTE) SARS-CoV-2 target nucleic acids are NOT DETECTED.  The SARS-CoV-2 RNA is generally detectable in upper respiratory specimens during the acute phase of infection. The lowest concentration of SARS-CoV-2 viral copies this assay can detect is 138 copies/mL. A negative result does not preclude SARS-Cov-2 infection and should not be used as the sole basis for treatment or other patient management decisions. A negative result may occur with  improper specimen collection/handling, submission of specimen other than nasopharyngeal  swab, presence of viral mutation(s) within the areas targeted by this assay, and inadequate number of viral copies(<138 copies/mL). A negative result must be combined with clinical observations, patient history, and epidemiological information. The expected result is Negative.  Fact Sheet for Patients:  BloggerCourse.com  Fact Sheet for Healthcare Providers:  SeriousBroker.it  This test is no t yet approved or cleared by the [place]  FDA and  has been authorized for detection and/or diagnosis of SARS-CoV-2 by FDA under an Emergency Use Authorization (EUA). This EUA will remain  in effect (meaning this test can be used) for the duration of the COVID-19 declaration under Section 564(b)(1) of the Act, 21 U.S.C.section 360bbb-3(b)(1), unless the authorization is terminated  or revoked sooner.       Influenza A by PCR NEGATIVE NEGATIVE Final   Influenza B by PCR NEGATIVE NEGATIVE Final    Comment: (NOTE) The Xpert Xpress SARS-CoV-2/FLU/RSV plus assay is intended as an aid in the diagnosis of influenza from Nasopharyngeal swab specimens and should not be used as a sole basis for treatment. Nasal washings and aspirates are unacceptable for Xpert Xpress SARS-CoV-2/FLU/RSV testing.  Fact Sheet for Patients: BloggerCourse.com  Fact Sheet for Healthcare Providers: SeriousBroker.it  This test is not yet approved  or cleared by the [place]  FDA and has been authorized for detection and/or diagnosis of SARS-CoV-2 by FDA under an Emergency Use Authorization (EUA). This EUA will remain in effect (meaning this test can be used) for the duration of the COVID-19 declaration under Section 564(b)(1) of the Act, 21 U.S.C. section 360bbb-3(b)(1), unless the authorization is terminated or revoked.     Resp Syncytial Virus by PCR NEGATIVE NEGATIVE Final    Comment: (NOTE) Fact Sheet for  Patients: BloggerCourse.com  Fact Sheet for Healthcare Providers: SeriousBroker.it  This test is not yet approved or cleared by the [place]  FDA and has been authorized for detection and/or diagnosis of SARS-CoV-2 by FDA under an Emergency Use Authorization (EUA). This EUA will remain in effect (meaning this test can be used) for the duration of the COVID-19 declaration under Section 564(b)(1) of the Act, 21 U.S.C. section 360bbb-3(b)(1), unless the authorization is terminated or revoked.  Performed at [facility], [address] [place], [place] [address]   Culture, blood (Routine X 2) w Reflex to ID Panel     Status: None (Preliminary result)   Collection Time: 12/09/23  5:09 PM   Specimen: BLOOD  Result Value Ref Range Status   Specimen Description   Final    BLOOD RAC Performed at [facility], [address] [place], [place] [address]    Special Requests   Final    BOTTLES DRAWN AEROBIC AND ANAEROBIC BCAV Performed at [facility] [address] [place], [place] [address]    Culture   Final    NO GROWTH < 12 HOURS Performed at [facility], [address], [place], [place] [address]    Report Status PENDING  Incomplete  Culture, blood (Routine X 2) w Reflex to ID Panel     Status: None (Preliminary result)   Collection Time: 12/09/23  5:43 PM   Specimen: BLOOD LEFT FOREARM  Result Value Ref Range Status   Specimen Description   Final    BLOOD LEFT FOREARM Performed at [facility], [address], [place], [place] [address]    Special Requests   Final    BOTTLES DRAWN AEROBIC AND ANAEROBIC BCHV Performed at [facility], [address] [place], [place] [address]    Culture   Final    NO GROWTH < 12 HOURS Performed at [facility], [address] [place], [place] [address]    Report Status PENDING  Incomplete         Radiology  Studies: CT CHEST ABDOMEN PELVIS W CONTRAST Result Date: 12/09/2023 EXAM: CT CHEST, ABDOMEN AND PELVIS WITH CONTRAST 12/09/2023 09:01:00 PM TECHNIQUE: CT of the chest, abdomen and pelvis was performed with the administration of intravenous contrast. Multiplanar reformatted images are provided for review. Automated exposure control, iterative reconstruction, and/or weight based adjustment of the mA/kV was utilized to reduce the radiation dose to as low as reasonably achievable. COMPARISON: CT abdomen/pelvis dated 03/07/2018 and CT chest dated 04/21/2004. CLINICAL HISTORY: Respiratory illness, nondiagnostic xray. Pt states that he has had some dizziness and chills since Thursday. Family states that pt is out of his blood pressure medication. Pt also states pain in his ribs. Denies cough, fever. Pt states that he fell this morning after he stood up. Hx of ETOH. Last drank Wednesday. FINDINGS: CHEST: MEDIASTINUM AND LYMPH NODES: Status post esophagectomy with gastric pull-through. No mediastinal, hilar or axillary lymphadenopathy.  LUNGS AND PLEURA: Scattered peribronchovascular nodularity including chronic nodularity in the right upper lobe measuring up to 6 mm (image 35), benign. Bronchiectasis particularly in the inferior right upper lobe and superior segment left lower lobe, chronic. No new/suspicious pulmonary nodules. No pleural effusion or pneumothorax. ABDOMEN AND PELVIS: LIVER: The liver is unremarkable. GALLBLADDER AND BILE DUCTS: Gallbladder is unremarkable. No biliary ductal dilatation. SPLEEN: No acute abnormality. PANCREAS: No acute abnormality. ADRENAL GLANDS: No acute abnormality. KIDNEYS, URETERS AND BLADDER: No stones in the kidneys or ureters. No hydronephrosis. No perinephric or periureteral stranding. Mildly thick-walled bladder, underdistended. GI AND BOWEL: Normal appendix (image 92). Stomach demonstrates no acute abnormality. There is no bowel obstruction. REPRODUCTIVE ORGANS: Prostate is  unremarkable. PERITONEUM AND RETROPERITONEUM: No ascites. No free air. VASCULATURE: Mild thoracic aortic atherosclerosis. Atherosclerotic calcifications of the abdominal aorta and branch vessels. ABDOMINAL AND PELVIS LYMPH NODES: No lymphadenopathy. REPRODUCTIVE ORGANS: No acute abnormality. BONES AND SOFT TISSUES: Acute segmental nondisplaced right posterior 11th and 12th rib fractures. Additional old chronic right rib fracture deformities. Mild degenerative changes at L1-2 and L5-S1. IMPRESSION: 1. Acute right posterior 11th and 12th rib fractures, as above. No pneumothorax. 2. Status post esophagectomy with gastric pull through. 3. Additional stable ancillary findings as above. Electronically signed by: Pinkie Pebbles MD 12/09/2023 09:12 PM EDT RP Workstation: [id number]   CT Head Wo Contrast Result Date: 12/09/2023 CLINICAL DATA:  Fall this morning. Dizziness and chills since Thursday. EXAM: CT HEAD WITHOUT CONTRAST CT CERVICAL SPINE WITHOUT CONTRAST TECHNIQUE: Multidetector CT imaging of the head and cervical spine was performed following the standard protocol without intravenous contrast. Multiplanar CT image reconstructions of the cervical spine were also generated. RADIATION DOSE REDUCTION: This exam was performed according to the departmental dose-optimization program which includes automated exposure control, adjustment of the mA and/or kV according to patient size and/or use of iterative reconstruction technique. COMPARISON:  Head CT 03/10/2003, MRI cervical spine 04/26/2021 FINDINGS: CT HEAD FINDINGS Brain: Ventricles, cisterns and other CSF spaces are normal. Minimal chronic ischemic microvascular disease. No mass, mass effect, shift of midline structures or acute hemorrhage. No acute infarction. Vascular: No hyperdense vessel or unexpected calcification. Skull: Normal. Negative for fracture or focal lesion. Sinuses/Orbits: No acute finding. Other: None. CT CERVICAL SPINE FINDINGS Alignment: No  posttraumatic subluxation. Skull base and vertebrae: Vertebral body heights are normal. There is mild spondylosis throughout the cervical spine to include uncovertebral joint spurring and facet arthropathy. Minimal right-sided neural foraminal narrowing at the C2-3 level. Moderate bilateral neural foraminal narrowing at the C3-4 level and C4-5 levels. Left-sided neural foraminal narrowing at the C5-6 level and bilateral neural foraminal narrowing at the C6-7 level. Minimal bilateral neural foraminal narrowing at the C7-T1 level. Several small scattered lucent lesions throughout the vertebral bodies of the visualized cervicothoracic spine. Soft tissues and spinal canal: Prevertebral soft tissues are normal. Spinal canal is unremarkable. Disc levels: Moderate disc space narrowing at the C5-6, C6-7 and C7-T1 levels. Upper chest: Air collection over the right neck base lateral to the trachea at the T1-T3 level with suggestion of surgical sutures along the inferolateral periphery as this is likely postsurgical versus lung herniation. Other: None. IMPRESSION: 1. No acute brain injury. 2. Minimal chronic ischemic microvascular disease. 3. No acute cervical spine injury. 4. Mild spondylosis throughout the cervical spine with multilevel disc disease and neural foraminal narrowing as described. 5. Several small scattered lucent lesions throughout the vertebral bodies of the visualized cervicothoracic spine. These are nonspecific and may be due to osteopenia,  however metastatic disease or multiple myeloma is possible. 6. Air collection over the right neck base lateral to the trachea at the T1-T3 level with suggestion of surgical sutures along the inferolateral periphery as this is likely postsurgical versus lung herniation. Recommend correlation with clinical history. Electronically Signed   By: Toribio Agreste M.D.   On: 12/09/2023 18:35   CT Cervical Spine Wo Contrast Result Date: 12/09/2023 CLINICAL DATA:  Fall this  morning. Dizziness and chills since Thursday. EXAM: CT HEAD WITHOUT CONTRAST CT CERVICAL SPINE WITHOUT CONTRAST TECHNIQUE: Multidetector CT imaging of the head and cervical spine was performed following the standard protocol without intravenous contrast. Multiplanar CT image reconstructions of the cervical spine were also generated. RADIATION DOSE REDUCTION: This exam was performed according to the departmental dose-optimization program which includes automated exposure control, adjustment of the mA and/or kV according to patient size and/or use of iterative reconstruction technique. COMPARISON:  Head CT 03/10/2003, MRI cervical spine 04/26/2021 FINDINGS: CT HEAD FINDINGS Brain: Ventricles, cisterns and other CSF spaces are normal. Minimal chronic ischemic microvascular disease. No mass, mass effect, shift of midline structures or acute hemorrhage. No acute infarction. Vascular: No hyperdense vessel or unexpected calcification. Skull: Normal. Negative for fracture or focal lesion. Sinuses/Orbits: No acute finding. Other: None. CT CERVICAL SPINE FINDINGS Alignment: No posttraumatic subluxation. Skull base and vertebrae: Vertebral body heights are normal. There is mild spondylosis throughout the cervical spine to include uncovertebral joint spurring and facet arthropathy. Minimal right-sided neural foraminal narrowing at the C2-3 level. Moderate bilateral neural foraminal narrowing at the C3-4 level and C4-5 levels. Left-sided neural foraminal narrowing at the C5-6 level and bilateral neural foraminal narrowing at the C6-7 level. Minimal bilateral neural foraminal narrowing at the C7-T1 level. Several small scattered lucent lesions throughout the vertebral bodies of the visualized cervicothoracic spine. Soft tissues and spinal canal: Prevertebral soft tissues are normal. Spinal canal is unremarkable. Disc levels: Moderate disc space narrowing at the C5-6, C6-7 and C7-T1 levels. Upper chest: Air collection over the  right neck base lateral to the trachea at the T1-T3 level with suggestion of surgical sutures along the inferolateral periphery as this is likely postsurgical versus lung herniation. Other: None. IMPRESSION: 1. No acute brain injury. 2. Minimal chronic ischemic microvascular disease. 3. No acute cervical spine injury. 4. Mild spondylosis throughout the cervical spine with multilevel disc disease and neural foraminal narrowing as described. 5. Several small scattered lucent lesions throughout the vertebral bodies of the visualized cervicothoracic spine. These are nonspecific and may be due to osteopenia, however metastatic disease or multiple myeloma is possible. 6. Air collection over the right neck base lateral to the trachea at the T1-T3 level with suggestion of surgical sutures along the inferolateral periphery as this is likely postsurgical versus lung herniation. Recommend correlation with clinical history. Electronically Signed   By: Toribio Agreste M.D.   On: 12/09/2023 18:35   DG Chest Port 1 View Result Date: 12/09/2023 CLINICAL DATA:  Chest pain right-sided.  Fall today. EXAM: PORTABLE CHEST 1 VIEW COMPARISON:  06/20/2022 FINDINGS: Lungs are hyperexpanded without focal airspace consolidation or effusion. Cardiomediastinal silhouette is unchanged. 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 Remainder the exam is unchanged. IMPRESSION: 1. No acute cardiopulmonary disease. 2. Postsurgical change compatible with patient's esophagectomy/gastric pull-through procedure. Electronically Signed   By: Toribio Agreste M.D.   On: 12/09/2023 17:50        Scheduled Meds:  lidocaine   1 patch Transdermal Q24H   LORazepam   0-4 mg Intravenous  Q6H   Or   LORazepam   0-4 mg Oral Q6H   [START ON 12/12/2023] LORazepam   0-4 mg Intravenous Q12H   Or   [START ON 12/12/2023] LORazepam   0-4 mg Oral Q12H   pantoprazole  (PROTONIX ) IV  40 mg Intravenous Q12H   thiamine   100 mg Oral  Daily   Or   thiamine   100 mg Intravenous Daily   Continuous Infusions:  sodium chloride  75 mL/hr at 12/10/23 0904     LOS: 1 day    Time spent: 55 minutes    Renato Applebaum, MD [facility]

## 2023-12-10 NOTE — H&P (View-Only) (Signed)
 Referring Provider:  Dr. Raenelle, TRH Primary Care Physician:  Dorina Dallas RIGGERS Primary Gastroenterologist:  Dr. Albertus  Reason for Consultation:  Melena, anemia  HPI: Nathaniel Williams is a 63 y.o. male with a history of bleeding gastric ulcer in 2022 due to NSAID use, achalasia status post partial esophageal resection with gastric pull-through.  He presented to the hospital with complaints of dizziness, lightheadedness, and fall on 9/21.  Has been having black tarry stool for about 5 days.  Says that he had no GI issues or complaints until this all began last Thursday.  Not taking NSAIDs.  Does drink ETOH in the form of liquor, a couple of drinks daily.  Has not been on any PPI as no GI complaints.  No heartburn/reflux.  No dysphagia.  No abdominal pain.  Says that he has lost about 10-15 pounds in the past year but not sure why.  No further BM/black stool since his admission.  BUN elevated at 36->26.  Hgb 6.1 grams down from 13.1 grams one year ago.  FOBT positive.  Has received 2 units of packed red blood cells.  CT chest/abdomen/pelvis with contrast 12/09/2023 with rib fractures from fall but no GI findings.  Colonoscopy 04/2021:  - Two 2 and 7 mm polyps in the ascending colon, removed with a cold snare. Resected and retrieved. - Internal hemorrhoids. - The examination was otherwise normal.  EGD 04/2021:  - An esophagogastric anastomosis was found in upper esophagus. - Z- line irregular, 22 cm from the incisors. Biopsied. - Normal stomach. Gastric ulcer has healed with medical therapy. - Normal examined duodenum.  1. Surgical [P], irregular z line at 22 - CARDIAC MUCOSA WITH MILD CHRONIC NONSPECIFIC CARDITIS - NEGATIVE FOR INTESTINAL METAPLASIA OR DYSPLASIA 2. Surgical [P], colon, ascending, polyp (2) - TUBULAR ADENOMA WITHOUT HIGH-GRADE DYSPLASIA OR MALIGNANCY - SESSILE SERRATED POLYP(S) WITHOUT CYTOLOGIC DYSPLASIA  EGD 10/2020:  - An esophago- gastric anastomosis was found  at 22 cm ( previous subtotal esophagectomy with gastric pull- through. Z- line irregular. - Non- bleeding gastric ulcer with small pigmented spot. Clip ( MR conditional) placement unsuccessful. - Gastritis. Biopsied. - Normal examined duodenum.  A. STOMACH, BIOPSY:  - Reactive gastropathy.  - Warthin-Starry is negative for Helicobacter pylori.  - No intestinal metaplasia, dysplasia, or malignancy.   Past Medical History:  Diagnosis Date   Achalasia 1996   Dx in [place] s/p Heller Myotomy w/gastric pull-thru   Alcohol use    Aspiration pneumonia (HCC)    Cervical spondylosis    Gastric ulcer    Gastritis    GI bleed    H/O hydrocele    of scrotum   Hypertension    IDA (iron deficiency anemia)     Past Surgical History:  Procedure Laterality Date   BIOPSY  10/21/2020   Procedure: BIOPSY;  Surgeon: Albertus Gordy HERO, MD;  Location: [facility] ENDOSCOPY;  Service: Gastroenterology;;   ESOPHAGOGASTRODUODENOSCOPY (EGD) WITH PROPOFOL  N/A 10/21/2020   Procedure: ESOPHAGOGASTRODUODENOSCOPY (EGD) WITH PROPOFOL ;  Surgeon: Albertus Gordy HERO, MD;  Location: [facility] ENDOSCOPY;  Service: Gastroenterology;  Laterality: N/A;   HELLER MYOTOMY  1996   in [place]; w/gastric pull thru   HEMOSTASIS CLIP PLACEMENT  10/21/2020   Procedure: HEMOSTASIS CLIP PLACEMENT;  Surgeon: Albertus Gordy HERO, MD;  Location: [facility] ENDOSCOPY;  Service: Gastroenterology;;    Prior to Admission medications   Medication Sig Start Date End Date Taking? Authorizing Provider  Ascorbic Acid (VITAMIN C) 1000 MG tablet Take 1,000 mg by mouth daily.  Patient not taking: Reported on 12/10/2023    [provider]  B Complex-C (SUPER B COMPLEX PO) Take 1 tablet by mouth daily. Patient not taking: Reported on 12/10/2023    [provider]  Camphor-Menthol-Methyl Sal (TIGER BALM MUSCLE RUB EX) Apply 1 application. topically as needed (back pain). Patient not taking: Reported on 12/10/2023    [provider]  Cholecalciferol  (VITAMIN D3) 50 MCG (2000 UT) capsule Take 2,000 Units by mouth daily. Patient not taking: Reported on 12/10/2023    [provider]  clobetasol cream (TEMOVATE) 0.05 % Apply 1 Application topically daily. Patient not taking: Reported on 12/10/2023 10/06/22   [provider]  gabapentin  (NEURONTIN ) 100 MG capsule Take 1 capsule (100 mg total) by mouth every evening. Patient not taking: Reported on 12/10/2023 04/05/22     hydrALAZINE  (APRESOLINE ) 50 MG tablet Take 1 tablet (50 mg total) by mouth 3 (three) times daily. Patient not taking: Reported on 12/10/2023 11/14/22   Saguier, [place], PA-C  lidocaine  (LIDODERM ) 5 % Place 1 patch onto the skin daily as needed (back pain). Remove & Discard patch within 12 hours or as directed by MD Patient not taking: Reported on 12/10/2023    [provider]  lisinopril  (ZESTRIL ) 40 MG tablet Take 1 tablet (40 mg total) by mouth daily. Patient not taking: Reported on 12/10/2023 04/18/22   Saguier, [place], PA-C  metoprolol  succinate (TOPROL -XL) 50 MG 24 hr tablet Take 1 tablet (50 mg total) by mouth daily. TAKE WITH OR IMMEDIATELY FOLLOWING A MEAL. 12/10/20 10/27/22  Saguier, [place], PA-C  metoprolol  succinate (TOPROL -XL) 50 MG 24 hr tablet Take 1 tablet (50 mg total) by mouth daily. Take with or immediately following a meal. Patient not taking: Reported on 12/10/2023 01/11/22   Saguier, [place], PA-C  Misc Natural Products (OSTEO BI-FLEX ADV TRIPLE ST PO) Take 1 tablet by mouth daily. Patient not taking: Reported on 12/10/2023    [provider]  ondansetron  (ZOFRAN -ODT) 4 MG disintegrating tablet Take 1 tablet (4 mg total) by mouth every 8 (eight) hours as needed. Patient not taking: Reported on 12/10/2023 06/21/22   Long, Fonda MATSU, MD  OVER THE COUNTER MEDICATION Take 1 tablet by mouth daily. [employer] AND METABOBLISM Patient not taking: Reported on 12/10/2023    [provider]  pantoprazole  (PROTONIX ) 40 MG tablet Take 1  tablet (40 mg total) by mouth daily. Patient not taking: Reported on 12/10/2023 10/27/22   Saguier, [place], PA-C  PARoxetine  (PAXIL ) 10 MG tablet Take 1 tablet (10 mg total) by mouth daily. Patient not taking: Reported on 12/10/2023 10/27/22   Saguier, [place], PA-C  polyvinyl alcohol (LIQUIFILM TEARS) 1.4 % ophthalmic solution Place 1 drop into both eyes as needed for dry eyes. Patient not taking: Reported on 12/10/2023    [provider]  potassium chloride  SA (KLOR-CON  M) 20 MEQ tablet Take 2 tablets (40 mEq total) by mouth daily for 5 days. 06/21/22 10/27/22  Long, Joshua G, MD  senna-docusate (SENOKOT-S) 8.6-50 MG tablet Take 1 tablet by mouth at bedtime as needed for mild to moderate constipation. Patient not taking: Reported on 12/10/2023 08/29/22   Saguier, [place], PA-C  Turmeric Curcumin 500 MG CAPS Take 1 capsule by mouth daily. Patient not taking: Reported on 12/10/2023    [provider]  vitamin E 180 MG (400 UNITS) capsule Take 400 Units by mouth daily. Patient not taking: Reported on 12/10/2023    [provider]    Current Facility-Administered  Medications  Medication Dose Route Frequency Provider Last Rate Last Admin   0.9 %  sodium chloride  infusion   Intravenous Continuous Debby Camila LABOR, MD 75 mL/hr at 12/10/23 0904 New Bag at 12/10/23 0904   acetaminophen  (TYLENOL ) tablet 650 mg  650 mg Oral Q6H PRN Debby Camila LABOR, MD       Or   acetaminophen  (TYLENOL ) suppository 650 mg  650 mg Rectal Q6H PRN Debby Camila LABOR, MD       albuterol  (PROVENTIL ) (2.5 MG/3ML) 0.083% nebulizer solution 2.5 mg  2.5 mg Nebulization Q2H PRN Debby Camila LABOR, MD       lidocaine  (LIDODERM ) 5 % 1 patch  1 patch Transdermal Q24H Raenelle Coria, MD       LORazepam  (ATIVAN ) injection 0-4 mg  0-4 mg Intravenous Q6H Zackowski, Scott, MD   2 mg at 12/09/23 2348   Or   LORazepam  (ATIVAN ) tablet 0-4 mg  0-4 mg Oral Q6H Zackowski, Scott, MD       NOREEN ON 12/12/2023] LORazepam   (ATIVAN ) injection 0-4 mg  0-4 mg Intravenous Q12H Zackowski, Scott, MD       Or   NOREEN ON 12/12/2023] LORazepam  (ATIVAN ) tablet 0-4 mg  0-4 mg Oral Q12H Zackowski, Scott, MD       ondansetron  (ZOFRAN ) tablet 4 mg  4 mg Oral Q6H PRN Debby Camila LABOR, MD       Or   ondansetron  (ZOFRAN ) injection 4 mg  4 mg Intravenous Q6H PRN Debby Camila LABOR, MD       pantoprazole  (PROTONIX ) injection 40 mg  40 mg Intravenous Q12H Zackowski, Scott, MD   40 mg at 12/10/23 9094   thiamine  (VITAMIN B1) tablet 100 mg  100 mg Oral Daily Zackowski, Scott, MD       Or   thiamine  (VITAMIN B1) injection 100 mg  100 mg Intravenous Daily Zackowski, Scott, MD   100 mg at 12/09/23 1831    Allergies as of 12/09/2023   (No Known Allergies)    Family History  Problem Relation Age of Onset   Cervical cancer Mother    Alzheimer's disease Father    Stomach cancer Neg Hx    Esophageal cancer Neg Hx    Pancreatic cancer Neg Hx     Social History   Socioeconomic History   Marital status: Legally Separated    Spouse name: Not on file   Number of children: Not on file   Years of education: Not on file   Highest education level: Not on file  Occupational History   Not on file  Tobacco Use   Smoking status: Former    Current packs/day: 0.25    Average packs/day: 0.3 packs/day for 18.0 years (4.5 ttl pk-yrs)    Types: Cigarettes   Smokeless tobacco: Never  Vaping Use   Vaping status: Never Used  Substance and Sexual Activity   Alcohol use: Not Currently    Comment: 1.5 L rum every 10 days (per hospital record 10/21/20)   Drug use: Never   Sexual activity: Not on file  Other Topics Concern   Not on file  Social History Narrative   Not on file   Social Drivers of Health   Financial Resource Strain: Not on file  Food Insecurity: No Food Insecurity (12/09/2023)   Hunger Vital Sign    Worried About Running Out of Food in the Last Year: Never true    Ran Out of Food in the Last Year: Never true  Transportation Needs: No Transportation Needs (12/09/2023)   PRAPARE - Administrator, Civil Service (Medical): No    Lack of Transportation (Non-Medical): No  Physical Activity: Not on file  Stress: Not on file  Social Connections: Unknown (08/02/2021)   Received [facility]   Social Network    Social Network: Not on file  Intimate Partner Violence: Not At Risk (12/09/2023)   Humiliation, Afraid, Rape, and Kick questionnaire    Fear of Current or Ex-Partner: No    Emotionally Abused: No    Physically Abused: No    Sexually Abused: No    Review of Systems: ROS is O/W negative except as mentioned in HPI.  Physical Exam: Vital signs in last 24 hours: Temp:  [97.8 F (36.6 C)-99.7 F (37.6 C)] 98.5 F (36.9 C) (09/22 1103) Pulse Rate:  [80-104] 80 (09/22 1103) Resp:  [15-20] 17 (09/22 1103) BP: (99-140)/(55-83) 124/72 (09/22 1103) SpO2:  [97 %-100 %] 100 % (09/22 1103) Weight:  [49.9 kg-72.6 kg] 49.9 kg (09/21 2333)   General:  Alert, thin, pleasant and cooperative in NAD Head:  Normocephalic and atraumatic. Eyes:  Sclera clear, no icterus.  Conjunctiva pink. Ears:  Normal auditory acuity. Mouth:  No deformity or lesions.   Lungs:  Clear throughout to auscultation.  No wheezes, crackles, or rhonchi.  Heart:  Regular rate and rhythm; no murmurs, clicks, rubs, or gallops. Abdomen:  Soft, non-distended.  BS present.  Non-tender.   Rectal:  Hemoccult positive in the ED.  Msk:  Symmetrical without gross deformities. Pulses:  Normal pulses noted. Extremities:  Without clubbing or edema. Neurologic:  Alert and oriented x 4;  grossly normal neurologically. Skin:  Intact without significant lesions or rashes. Psych:  Alert and cooperative. Normal mood and affect.  Intake/Output from previous day: 09/21 0701 - 09/22 0700 In: 2197.1 [I.V.:1197.1; IV Piggyback:1000] Out: 325 [Urine:325] Intake/Output this shift: Total I/O In: 402 [Blood:402] Out: -   Lab  Results: Recent Labs    12/09/23 1742 12/10/23 0108  WBC 9.5 7.0  HGB 7.3* 6.1*  HCT 21.5* 19.4*  PLT 179 150   BMET Recent Labs    12/09/23 1742 12/10/23 0108  NA 136 139  K 4.0 3.9  CL 102 107  CO2 23 24  GLUCOSE 131* 113*  BUN 36* 26*  CREATININE 0.78 0.73  CALCIUM 8.5* 8.3*   LFT Recent Labs    12/09/23 1742  PROT 5.8*  ALBUMIN 3.8  AST 48*  ALT 31  ALKPHOS 46  BILITOT 0.3   Studies/Results: CT CHEST ABDOMEN PELVIS W CONTRAST Result Date: 12/09/2023 EXAM: CT CHEST, ABDOMEN AND PELVIS WITH CONTRAST 12/09/2023 09:01:00 PM TECHNIQUE: CT of the chest, abdomen and pelvis was performed with the administration of intravenous contrast. Multiplanar reformatted images are provided for review. Automated exposure control, iterative reconstruction, and/or weight based adjustment of the mA/kV was utilized to reduce the radiation dose to as low as reasonably achievable. COMPARISON: CT abdomen/pelvis dated 03/07/2018 and CT chest dated 04/21/2004. CLINICAL HISTORY: Respiratory illness, nondiagnostic xray. Pt states that he has had some dizziness and chills since Thursday. Family states that pt is out of his blood pressure medication. Pt also states pain in his ribs. Denies cough, fever. Pt states that he fell this morning after he stood up. Hx of ETOH. Last drank Wednesday. FINDINGS: CHEST: MEDIASTINUM AND LYMPH NODES: Status post esophagectomy with gastric pull-through. No mediastinal, hilar or axillary lymphadenopathy. LUNGS AND PLEURA: Scattered peribronchovascular nodularity including chronic nodularity  in the right upper lobe measuring up to 6 mm (image 35), benign. Bronchiectasis particularly in the inferior right upper lobe and superior segment left lower lobe, chronic. No new/suspicious pulmonary nodules. No pleural effusion or pneumothorax. ABDOMEN AND PELVIS: LIVER: The liver is unremarkable. GALLBLADDER AND BILE DUCTS: Gallbladder is unremarkable. No biliary ductal dilatation.  SPLEEN: No acute abnormality. PANCREAS: No acute abnormality. ADRENAL GLANDS: No acute abnormality. KIDNEYS, URETERS AND BLADDER: No stones in the kidneys or ureters. No hydronephrosis. No perinephric or periureteral stranding. Mildly thick-walled bladder, underdistended. GI AND BOWEL: Normal appendix (image 92). Stomach demonstrates no acute abnormality. There is no bowel obstruction. REPRODUCTIVE ORGANS: Prostate is unremarkable. PERITONEUM AND RETROPERITONEUM: No ascites. No free air. VASCULATURE: Mild thoracic aortic atherosclerosis. Atherosclerotic calcifications of the abdominal aorta and branch vessels. ABDOMINAL AND PELVIS LYMPH NODES: No lymphadenopathy. REPRODUCTIVE ORGANS: No acute abnormality. BONES AND SOFT TISSUES: Acute segmental nondisplaced right posterior 11th and 12th rib fractures. Additional old chronic right rib fracture deformities. Mild degenerative changes at L1-2 and L5-S1. IMPRESSION: 1. Acute right posterior 11th and 12th rib fractures, as above. No pneumothorax. 2. Status post esophagectomy with gastric pull through. 3. Additional stable ancillary findings as above. Electronically signed by: Pinkie Pebbles MD 12/09/2023 09:12 PM EDT RP Workstation: [id number]   CT Head Wo Contrast Result Date: 12/09/2023 CLINICAL DATA:  Fall this morning. Dizziness and chills since Thursday. EXAM: CT HEAD WITHOUT CONTRAST CT CERVICAL SPINE WITHOUT CONTRAST TECHNIQUE: Multidetector CT imaging of the head and cervical spine was performed following the standard protocol without intravenous contrast. Multiplanar CT image reconstructions of the cervical spine were also generated. RADIATION DOSE REDUCTION: This exam was performed according to the departmental dose-optimization program which includes automated exposure control, adjustment of the mA and/or kV according to patient size and/or use of iterative reconstruction technique. COMPARISON:  Head CT 03/10/2003, MRI cervical spine 04/26/2021 FINDINGS:  CT HEAD FINDINGS Brain: Ventricles, cisterns and other CSF spaces are normal. Minimal chronic ischemic microvascular disease. No mass, mass effect, shift of midline structures or acute hemorrhage. No acute infarction. Vascular: No hyperdense vessel or unexpected calcification. Skull: Normal. Negative for fracture or focal lesion. Sinuses/Orbits: No acute finding. Other: None. CT CERVICAL SPINE FINDINGS Alignment: No posttraumatic subluxation. Skull base and vertebrae: Vertebral body heights are normal. There is mild spondylosis throughout the cervical spine to include uncovertebral joint spurring and facet arthropathy. Minimal right-sided neural foraminal narrowing at the C2-3 level. Moderate bilateral neural foraminal narrowing at the C3-4 level and C4-5 levels. Left-sided neural foraminal narrowing at the C5-6 level and bilateral neural foraminal narrowing at the C6-7 level. Minimal bilateral neural foraminal narrowing at the C7-T1 level. Several small scattered lucent lesions throughout the vertebral bodies of the visualized cervicothoracic spine. Soft tissues and spinal canal: Prevertebral soft tissues are normal. Spinal canal is unremarkable. Disc levels: Moderate disc space narrowing at the C5-6, C6-7 and C7-T1 levels. Upper chest: Air collection over the right neck base lateral to the trachea at the T1-T3 level with suggestion of surgical sutures along the inferolateral periphery as this is likely postsurgical versus lung herniation. Other: None. IMPRESSION: 1. No acute brain injury. 2. Minimal chronic ischemic microvascular disease. 3. No acute cervical spine injury. 4. Mild spondylosis throughout the cervical spine with multilevel disc disease and neural foraminal narrowing as described. 5. Several small scattered lucent lesions throughout the vertebral bodies of the visualized cervicothoracic spine. These are nonspecific and may be due to osteopenia, however metastatic disease or multiple myeloma is  possible.  6. Air collection over the right neck base lateral to the trachea at the T1-T3 level with suggestion of surgical sutures along the inferolateral periphery as this is likely postsurgical versus lung herniation. Recommend correlation with clinical history. Electronically Signed   By: Toribio Agreste M.D.   On: 12/09/2023 18:35   CT Cervical Spine Wo Contrast Result Date: 12/09/2023 CLINICAL DATA:  Fall this morning. Dizziness and chills since Thursday. EXAM: CT HEAD WITHOUT CONTRAST CT CERVICAL SPINE WITHOUT CONTRAST TECHNIQUE: Multidetector CT imaging of the head and cervical spine was performed following the standard protocol without intravenous contrast. Multiplanar CT image reconstructions of the cervical spine were also generated. RADIATION DOSE REDUCTION: This exam was performed according to the departmental dose-optimization program which includes automated exposure control, adjustment of the mA and/or kV according to patient size and/or use of iterative reconstruction technique. COMPARISON:  Head CT 03/10/2003, MRI cervical spine 04/26/2021 FINDINGS: CT HEAD FINDINGS Brain: Ventricles, cisterns and other CSF spaces are normal. Minimal chronic ischemic microvascular disease. No mass, mass effect, shift of midline structures or acute hemorrhage. No acute infarction. Vascular: No hyperdense vessel or unexpected calcification. Skull: Normal. Negative for fracture or focal lesion. Sinuses/Orbits: No acute finding. Other: None. CT CERVICAL SPINE FINDINGS Alignment: No posttraumatic subluxation. Skull base and vertebrae: Vertebral body heights are normal. There is mild spondylosis throughout the cervical spine to include uncovertebral joint spurring and facet arthropathy. Minimal right-sided neural foraminal narrowing at the C2-3 level. Moderate bilateral neural foraminal narrowing at the C3-4 level and C4-5 levels. Left-sided neural foraminal narrowing at the C5-6 level and bilateral neural foraminal  narrowing at the C6-7 level. Minimal bilateral neural foraminal narrowing at the C7-T1 level. Several small scattered lucent lesions throughout the vertebral bodies of the visualized cervicothoracic spine. Soft tissues and spinal canal: Prevertebral soft tissues are normal. Spinal canal is unremarkable. Disc levels: Moderate disc space narrowing at the C5-6, C6-7 and C7-T1 levels. Upper chest: Air collection over the right neck base lateral to the trachea at the T1-T3 level with suggestion of surgical sutures along the inferolateral periphery as this is likely postsurgical versus lung herniation. Other: None. IMPRESSION: 1. No acute brain injury. 2. Minimal chronic ischemic microvascular disease. 3. No acute cervical spine injury. 4. Mild spondylosis throughout the cervical spine with multilevel disc disease and neural foraminal narrowing as described. 5. Several small scattered lucent lesions throughout the vertebral bodies of the visualized cervicothoracic spine. These are nonspecific and may be due to osteopenia, however metastatic disease or multiple myeloma is possible. 6. Air collection over the right neck base lateral to the trachea at the T1-T3 level with suggestion of surgical sutures along the inferolateral periphery as this is likely postsurgical versus lung herniation. Recommend correlation with clinical history. Electronically Signed   By: Toribio Agreste M.D.   On: 12/09/2023 18:35   DG Chest Port 1 View Result Date: 12/09/2023 CLINICAL DATA:  Chest pain right-sided.  Fall today. EXAM: PORTABLE CHEST 1 VIEW COMPARISON:  06/20/2022 FINDINGS: Lungs are hyperexpanded without focal airspace consolidation or effusion. Cardiomediastinal silhouette is unchanged. 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 Remainder the exam is unchanged. IMPRESSION: 1. No acute cardiopulmonary disease. 2. Postsurgical change compatible with patient's esophagectomy/gastric  pull-through procedure. Electronically Signed   By: Toribio Agreste M.D.   On: 12/09/2023 17:50   IMPRESSION:  63 year old male with history of NSAID induced ulcer in 2022.  Presented with black stools x 4-5 days, dizziness, lightheadedness.  BUN elevated.  Hemoglobin 6.1 g.  Has received 2 units of packed red blood cells.  Hemoccult positive.  No NSAIDs currently.  Does drink ETOH daily, usually two liquor drinks a day.  Liver ok on CT scan and no history of liver disease.  AST just slightly elevated.  ? ETOH induced gastritis.  Weight loss:  He reports 10-15 pounds weight loss over the past year.  No GI complaints until this all occurred last week.  CT spine suggest some possible metastatic lesions vs multiple myeloma?  PLAN: - Pantoprazole  40 mg IV twice daily. - EGD on 9/23. -Monitor Hgb and transfuse further prn. -Will allow clear liquids this evening then NPO after midnight. -Likely needs to decrease ETOH consumption.   Harlene BIRCH. Zehr  12/10/2023, 12:59 PM    Attending physician's note   I have taken history, reviewed the chart and examined the patient. I performed a substantive portion of this encounter, including complete performance of at least one of the key components, in conjunction with the APP. I agree with the Advanced Practitioner's note, impression and recommendations.   Symptomatic anemia with H+ stools. H/O melena (resolved). Hb 6 (baseline 13, 2024) s/p 2U PRBC. Neg CT C/A/P yesterday w/t contrast except for rib fractures after fall. Neg EGD/colon Feb 2023 except for healed gastric ulcer, diminutive colonic polyps. No NSAIDs.   H/O NSAID induced ulcer 10/2020. Healed on FU EGD 04/2021  Achalasia s/p esophagectomy with gastric pull through.  No dysphagia.  H/O wt loss (15lb over last 1 yr)  Plan: - SBE in AM. If neg, would consider VCE. Elevated BUN s/o UGI bleed. - Trend CBC. Keep Hb>7 - IV Protonix . - Stop all ETOH.  Discussed risks and benefits with the patient  in detail. CT A/P reviewed independently. No cirrhosis.   Anselm Bring, MD [facility] [phone]

## 2023-12-11 ENCOUNTER — Encounter (HOSPITAL_COMMUNITY)
Admission: EM | Disposition: A | Discharge status: Home / Self Care | Payer: Self-pay | Source: Home / Self Care | Attending: Internal Medicine

## 2023-12-11 ENCOUNTER — Encounter (HOSPITAL_COMMUNITY): Payer: Self-pay | Admitting: Family Medicine

## 2023-12-11 ENCOUNTER — Inpatient Hospital Stay (HOSPITAL_COMMUNITY): Admitting: Registered Nurse

## 2023-12-11 DIAGNOSIS — K921 Melena: Secondary | ICD-10-CM

## 2023-12-11 DIAGNOSIS — K269 Duodenal ulcer, unspecified as acute or chronic, without hemorrhage or perforation: Secondary | ICD-10-CM | POA: Diagnosis not present

## 2023-12-11 DIAGNOSIS — Z87891 Personal history of nicotine dependence: Secondary | ICD-10-CM

## 2023-12-11 DIAGNOSIS — K2210 Ulcer of esophagus without bleeding: Secondary | ICD-10-CM

## 2023-12-11 DIAGNOSIS — D649 Anemia, unspecified: Secondary | ICD-10-CM | POA: Diagnosis not present

## 2023-12-11 DIAGNOSIS — D62 Acute posthemorrhagic anemia: Secondary | ICD-10-CM

## 2023-12-11 DIAGNOSIS — K289 Gastrojejunal ulcer, unspecified as acute or chronic, without hemorrhage or perforation: Secondary | ICD-10-CM | POA: Diagnosis not present

## 2023-12-11 DIAGNOSIS — K259 Gastric ulcer, unspecified as acute or chronic, without hemorrhage or perforation: Secondary | ICD-10-CM

## 2023-12-11 DIAGNOSIS — K222 Esophageal obstruction: Secondary | ICD-10-CM

## 2023-12-11 DIAGNOSIS — I10 Essential (primary) hypertension: Secondary | ICD-10-CM | POA: Diagnosis not present

## 2023-12-11 HISTORY — PX: ENTEROSCOPY: SHX5533

## 2023-12-11 LAB — TYPE AND SCREEN
ABO/RH(D): O POS
Antibody Screen: NEGATIVE
Unit division: 0
Unit division: 0

## 2023-12-11 LAB — CBC WITH DIFFERENTIAL/PLATELET
Abs Immature Granulocytes: 0.01 K/uL (ref 0.00–0.07)
Basophils Absolute: 0 K/uL (ref 0.0–0.1)
Basophils Relative: 1 %
Eosinophils Absolute: 0.1 K/uL (ref 0.0–0.5)
Eosinophils Relative: 1 %
HCT: 31 % — ABNORMAL LOW (ref 39.0–52.0)
Hemoglobin: 10.2 g/dL — ABNORMAL LOW (ref 13.0–17.0)
Immature Granulocytes: 0 %
Lymphocytes Relative: 21 %
Lymphs Abs: 1.1 K/uL (ref 0.7–4.0)
MCH: 30.9 pg (ref 26.0–34.0)
MCHC: 32.9 g/dL (ref 30.0–36.0)
MCV: 93.9 fL (ref 80.0–100.0)
Monocytes Absolute: 0.6 K/uL (ref 0.1–1.0)
Monocytes Relative: 10 %
Neutro Abs: 3.7 K/uL (ref 1.7–7.7)
Neutrophils Relative %: 67 %
Platelets: 161 K/uL (ref 150–400)
RBC: 3.3 MIL/uL — ABNORMAL LOW (ref 4.22–5.81)
RDW: 14.5 % (ref 11.5–15.5)
WBC: 5.4 K/uL (ref 4.0–10.5)
nRBC: 0 % (ref 0.0–0.2)

## 2023-12-11 LAB — PROTIME-INR
INR: 1.1 (ref 0.8–1.2)
Prothrombin Time: 15.3 s — ABNORMAL HIGH (ref 11.4–15.2)

## 2023-12-11 LAB — BPAM RBC
Blood Product Expiration Date: 202510182359
Blood Product Expiration Date: 202510212359
ISSUE DATE / TIME: 202509220411
ISSUE DATE / TIME: 202509221042
Unit Type and Rh: 5100
Unit Type and Rh: 5100

## 2023-12-11 SURGERY — ENTEROSCOPY
Anesthesia: Monitor Anesthesia Care

## 2023-12-11 MED ORDER — PROPOFOL 500 MG/50ML IV EMUL
INTRAVENOUS | Status: AC
  Filled 2023-12-11: qty 50

## 2023-12-11 MED ORDER — EPHEDRINE SULFATE-NACL 50-0.9 MG/10ML-% IV SOSY
PREFILLED_SYRINGE | INTRAVENOUS | Status: DC | PRN
  Administered 2023-12-11: 5 mg via INTRAVENOUS

## 2023-12-11 MED ORDER — DOCUSATE SODIUM 100 MG PO CAPS
100.0000 mg | ORAL_CAPSULE | Freq: Two times a day (BID) | ORAL | Status: DC
  Administered 2023-12-12: 100 mg via ORAL
  Filled 2023-12-11 (×2): qty 1

## 2023-12-11 MED ORDER — PROPOFOL 1000 MG/100ML IV EMUL
INTRAVENOUS | Status: AC
  Filled 2023-12-11: qty 100

## 2023-12-11 MED ORDER — POLYETHYLENE GLYCOL 3350 17 G PO PACK
17.0000 g | PACK | Freq: Every day | ORAL | Status: DC
  Administered 2023-12-11: 17 g via ORAL
  Filled 2023-12-11: qty 1

## 2023-12-11 MED ORDER — LIDOCAINE 2% (20 MG/ML) 5 ML SYRINGE
INTRAMUSCULAR | Status: DC | PRN
  Administered 2023-12-11: 40 mg via INTRAVENOUS

## 2023-12-11 MED ORDER — SUCRALFATE 1 GM/10ML PO SUSP
1.0000 g | Freq: Three times a day (TID) | ORAL | Status: DC
  Administered 2023-12-11 – 2023-12-12 (×4): 1 g via ORAL
  Filled 2023-12-11 (×4): qty 10

## 2023-12-11 MED ORDER — MORPHINE SULFATE (PF) 2 MG/ML IV SOLN
1.0000 mg | Freq: Once | INTRAVENOUS | Status: AC | PRN
  Administered 2023-12-11: 1 mg via INTRAVENOUS
  Filled 2023-12-11: qty 1

## 2023-12-11 MED ORDER — DEXMEDETOMIDINE HCL IN NACL 80 MCG/20ML IV SOLN
INTRAVENOUS | Status: DC | PRN
Start: 2023-12-11 — End: 2023-12-11
  Administered 2023-12-11: 8 ug via INTRAVENOUS
  Administered 2023-12-11: 4 ug via INTRAVENOUS

## 2023-12-11 MED ORDER — PROPOFOL 500 MG/50ML IV EMUL
INTRAVENOUS | Status: DC | PRN
  Administered 2023-12-11: 20 mg via INTRAVENOUS
  Administered 2023-12-11: 140 ug/kg/min via INTRAVENOUS
  Administered 2023-12-11: 20 mg via INTRAVENOUS

## 2023-12-11 NOTE — Anesthesia Postprocedure Evaluation (Signed)
 Anesthesia Post Note  Patient: Nathaniel Williams  Procedure(s) Performed: EGD (ESOPHAGOGASTRODUODENOSCOPY) ENTEROSCOPY     Patient location during evaluation: Endoscopy Anesthesia Type: MAC Level of consciousness: oriented, awake and alert and awake Pain management: pain level controlled Vital Signs Assessment: post-procedure vital signs reviewed and stable Respiratory status: spontaneous breathing, nonlabored ventilation, respiratory function stable and patient connected to nasal cannula oxygen Cardiovascular status: blood pressure returned to baseline and stable Postop Assessment: no headache, no backache and no apparent nausea or vomiting Anesthetic complications: no   No notable events documented.  Last Vitals:  Vitals:   12/11/23 0847 12/11/23 0924  BP: 127/72 131/86  Pulse: 75 67  Resp: 13 13  Temp:  36.7 C  SpO2: 98% 97%    Last Pain:  Vitals:   12/11/23 1002  TempSrc:   PainSc: 5                  Garnette FORBES Skillern

## 2023-12-11 NOTE — Anesthesia Preprocedure Evaluation (Addendum)
 Anesthesia Evaluation  Patient identified by MRN, date of birth, ID band Patient awake    Reviewed: Allergy & Precautions, NPO status , Patient's Chart, lab work & pertinent test results, reviewed documented beta blocker date and time   Airway Mallampati: III  TM Distance: >3 FB Neck ROM: Full    Dental  (+) Teeth Intact, Dental Advisory Given   Pulmonary former smoker   Pulmonary exam normal breath sounds clear to auscultation       Cardiovascular hypertension, Pt. on medications and Pt. on home beta blockers Normal cardiovascular exam Rhythm:Regular Rate:Normal     Neuro/Psych negative neurological ROS  negative psych ROS   GI/Hepatic PUD,GERD  Medicated,,(+)     substance abuse  alcohol use  Endo/Other  negative endocrine ROS    Renal/GU negative Renal ROS     Musculoskeletal negative musculoskeletal ROS (+)    Abdominal   Peds  Hematology  (+) Blood dyscrasia, anemia   Anesthesia Other Findings   Reproductive/Obstetrics                              Anesthesia Physical Anesthesia Plan  ASA: 3  Anesthesia Plan: MAC   Post-op Pain Management: Minimal or no pain anticipated   Induction: Intravenous  PONV Risk Score and Plan: 1 and TIVA and Treatment may vary due to age or medical condition  Airway Management Planned: Simple Face Mask and Natural Airway  Additional Equipment:   Intra-op Plan:   Post-operative Plan:   Informed Consent: I have reviewed the patients History and Physical, chart, labs and discussed the procedure including the risks, benefits and alternatives for the proposed anesthesia with the patient or authorized representative who has indicated his/her understanding and acceptance.     Dental advisory given  Plan Discussed with: CRNA  Anesthesia Plan Comments:          Anesthesia Quick Evaluation

## 2023-12-11 NOTE — Transfer of Care (Signed)
 Immediate Anesthesia Transfer of Care Note  Patient: Nathaniel Williams  Procedure(s) Performed: EGD (ESOPHAGOGASTRODUODENOSCOPY) ENTEROSCOPY  Patient Location: PACU and Endoscopy Unit  Anesthesia Type:MAC  Level of Consciousness: alert  and patient cooperative  Airway & Oxygen Therapy: Patient Spontanous Breathing and Patient connected to face mask oxygen  Post-op Assessment: Report given to RN, Post -op Vital signs reviewed and stable, and Patient moving all extremities  Post vital signs: Reviewed and stable  Last Vitals:  Vitals Value Taken Time  BP 73/48 12/11/23 08:27  Temp    Pulse 73 12/11/23 08:28  Resp 19 12/11/23 08:28  SpO2 100 % 12/11/23 08:28  Vitals shown include unfiled device data.  Last Pain:  Vitals:   12/11/23 0720  TempSrc: Temporal  PainSc: 8          Complications: No notable events documented.

## 2023-12-11 NOTE — Op Note (Signed)
 [facility] Patient Name: Nathaniel Williams Procedure Date: 12/11/2023 MRN: [id number] Attending MD: Aloha Finner , MD, [id number] Date of Birth: 02/22/1961 CSN: [id number] Age: 63 Admit Type: Outpatient Procedure:                Small bowel enteroscopy Indications:              Acute post hemorrhagic anemia, Melena Providers:                Aloha Finner, MD, Haskel Chris,                            Technician, Willy Hummer, RN Referring MD:              Medicines:                Monitored Anesthesia Care Complications:            No immediate complications. Estimated Blood Loss:     Estimated blood loss was minimal. Procedure:                Pre-Anesthesia Assessment:                           - Prior to the procedure, a History and Physical                            was performed, and patient medications and                            allergies were reviewed. The patient's tolerance of                            previous anesthesia was also reviewed. The risks                            and benefits of the procedure and the sedation                            options and risks were discussed with the patient.                            All questions were answered, and informed consent                            was obtained. Prior Anticoagulants: The patient has                            taken no anticoagulant or antiplatelet agents. ASA                            Grade Assessment: III - A patient with severe                            systemic disease. After reviewing the risks and  benefits, the patient was deemed in satisfactory                            condition to undergo the procedure.                           After obtaining informed consent, the endoscope was                            passed under direct vision. Throughout the                            procedure, the patient's blood pressure, pulse, and                             oxygen saturations were monitored continuously. The                            [id number]-[id number] ([id number]) colonoscope was introduced                            through the mouth and advanced to the proximal                            jejunum. The small bowel enteroscopy was                            accomplished without difficulty. The patient                            tolerated the procedure. Scope In: Scope Out: Findings:      No gross lesions were noted in the proximal esophagus.      An esophago-gastric anastomosis was found at 23 cm from the incisors.      At the anastomosis, there appeared to be ulceration surrounding one       benign-appearing, intrinsic moderate (circumferential scarring or       stenosis; an endoscope may pass) stenosis was found. This stenosis       measured 1.1 cm (inner diameter) x 1 cm (in length). The stenosis was       traversed with gentle pressure with the pediatric colonoscope. The site       was examined post traversing this with the pediatric colonoscope and       showed moderate mucosal disruption, moderate improvement in luminal       narrowing and no perforation.      One non-bleeding cratered gastric ulcer with a clean ulcer base (Forrest       Class III) was found in the prepyloric region of the stomach. The lesion       was 10 mm in largest dimension.      Patchy mildly erythematous mucosa without bleeding was found in the       entire examined stomach. Biopsies were taken with a cold forceps for       histology and Helicobacter pylori testing.      One elongated non-bleeding cratered duodenal ulcer with a clean ulcer       base (  Forrest Class III) was found in the duodenal bulb extending into       the duodenal sweep (appeared to have suture-like material in the area       which does not make much sense in the setting of his previous known       surgical history). The lesion was at least 30 mm in largest dimension.      Normal  mucosa was found in the rest of the visualized second portion of       the duodenum, in the third portion of the duodenum and in the fourth       portion of the duodenum.      Normal mucosa was found in the visualized proximal jejunum. Impression:               - No gross lesions in the proximal esophagus.                           - An esophago-gastric anastomosis was found at 23                            cm with ulceration surrounding a benign-appearing                            esophageal stenosis approximately 11 mm in                            diameter. This was traversed with gentle pressure                            with the pediatric colonoscope leading to a mucosal                            wrent at site of stenosis.                           - Non-bleeding gastric ulcer with a clean ulcer                            base (Forrest Class III) found in the prepylorus.                            Erythematous mucosa in the stomach - biopsied.                           - Non-bleeding duodenal ulcer with a clean ulcer                            base (Forrest Class III) in duodenal bulb extending                            to duodenal sweep. There appeared to suture-like                            material in this region.                           -  Normal mucosa was found in the rest of the                            visualized second portion of the duodenum, in the                            third portion of the duodenum and in the fourth                            portion of the duodenum.                           - Normal mucosa was found in the visualized                            proximal jejunum. Recommendation:           - The patient will be observed post-procedure,                            until all discharge criteria are met.                           - Return patient to hospital ward for ongoing care.                           - Await pathology results.                            - IV PPI recommended for 72 hours then may                            transition to p.o. PPI twice daily.                           - Carafate  1 g liquid slurry 4 times daily.                           - Repeat endoscopy in 6 to 10 weeks to evaluate for                            healing. At same time, dilation of the anastomosis                            can be considered by primary GI.                           - Fall liquid diet and advance as tolerated today.                           - No need for video capsule endoscopy or                            colonoscopy at this time.                           -  If patient has significant recurrent bleeding or                            transfusion dependency, repeat upper endoscopy can                            be considered.                           - The findings and recommendations were discussed                            with the patient.                           - The findings and recommendations were discussed                            with the referring physician. Procedure Code(s):        --- Professional ---                           [phone], Small intestinal endoscopy, enteroscopy                            beyond second portion of duodenum, not including                            ileum; with biopsy, single or multiple Diagnosis Code(s):        --- Professional ---                           [phone], Other specified postprocedural states                           K22.2, Esophageal obstruction                           K25.9, Gastric ulcer, unspecified as acute or                            chronic, without hemorrhage or perforation                           K31.89, Other diseases of stomach and duodenum                           K26.9, Duodenal ulcer, unspecified as acute or                            chronic, without hemorrhage or perforation                           D62, Acute posthemorrhagic anemia                            K92.1, Melena (  includes Hematochezia) CPT copyright 2022 [employer]. All rights reserved. The codes documented in this report are preliminary and upon coder review may  be revised to meet current compliance requirements. Aloha Finner, MD 12/11/2023 8:34:22 AM Number of Addenda: 0

## 2023-12-11 NOTE — Interval H&P Note (Signed)
 History and Physical Interval Note:  12/11/2023 6:17 AM  Nathaniel Williams  has presented today for surgery, with the diagnosis of Upper GI bleed.  The various methods of treatment have been discussed with the patient and family. After consideration of risks, benefits and other options for treatment, the patient has consented to  Procedure(s): EGD (ESOPHAGOGASTRODUODENOSCOPY) (N/A) ENTEROSCOPY (N/A) as a surgical intervention.  The patient's history has been reviewed, patient examined, no change in status, stable for surgery.  I have reviewed the patient's chart and labs.  Questions were answered to the patient's satisfaction.     Alonna Bartling Mansouraty Jr

## 2023-12-11 NOTE — Anesthesia Procedure Notes (Addendum)
 Procedure Name: MAC Date/Time: 12/11/2023 8:05 AM  Performed by: Memory Armida LABOR, CRNAPre-anesthesia Checklist: Patient identified, Emergency Drugs available, Suction available, Patient being monitored and Timeout performed Patient Re-evaluated:Patient Re-evaluated prior to induction Oxygen Delivery Method: Simple face mask Placement Confirmation: positive ETCO2 Dental Injury: Teeth and Oropharynx as per pre-operative assessment

## 2023-12-11 NOTE — Plan of Care (Incomplete)
  Problem: Clinical Measurements: Goal: Ability to maintain clinical measurements within normal limits will improve Outcome: Progressing Goal: Diagnostic test results will improve Outcome: Progressing   Problem: Activity: Goal: Risk for activity intolerance will decrease Outcome: Progressing   Problem: Pain Managment: Goal: General experience of comfort will improve and/or be controlled Outcome: Progressing   Problem: Safety: Goal: Ability to remain free from injury will improve Outcome: Progressing   Problem: Education: Goal: Knowledge of General Education information will improve Description: Including pain rating scale, medication(s)/side effects and non-pharmacologic comfort measures Outcome: Adequate for Discharge   Problem: Clinical Measurements: Goal: Will remain free from infection Outcome: Adequate for Discharge Goal: Respiratory complications will improve Outcome: Adequate for Discharge Goal: Cardiovascular complication will be avoided Outcome: Adequate for Discharge   Problem: Nutrition: Goal: Adequate nutrition will be maintained Outcome: Adequate for Discharge   Problem: Coping: Goal: Level of anxiety will decrease Outcome: Adequate for Discharge   Problem: Elimination: Goal: Will not experience complications related to bowel motility Outcome: Adequate for Discharge Goal: Will not experience complications related to urinary retention Outcome: Adequate for Discharge   Problem: Skin Integrity: Goal: Risk for impaired skin integrity will decrease Outcome: Adequate for Discharge

## 2023-12-11 NOTE — Plan of Care (Signed)

## 2023-12-11 NOTE — Progress Notes (Signed)
 PROGRESS NOTE    Nathaniel Williams  FMW:[id number] DOB: [birth year] DOA: 12/09/2023 PCP: Dorina Loving, PA-C    Brief Narrative:  63 year old history of hypertension, GERD, alcohol use, previous history of GI bleeding and iron deficiency anemia followed by hematology, 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  Black tarry stool since last 5 days.  In the emergency room hemodynamically stable.  Hemoglobin 7.3, 13.1 last year.  Platelets adequate.  Lipase normal.  Electrolytes are adequate.  Admitted with acute GI bleeding.  Also found to have right 11th and 12th rib fracture.  Subjective: Patient seen and examined.  Came back from procedure.  Ready to eat.  Mild pain right chest wall present.  Otherwise he feels much better. Underwent upper GI endoscopy and found to have bleeding on previous anastomotic site.  Assessment & Plan:   Acute upper GI bleeding, anemia of acute blood loss, symptomatic anemia: Received total 2 units of PRBC with clinical improvement.  Currently hemodynamically stable.  Continue to monitor hemoglobin. Upper GI endoscopy today 9/23, esophagogastric anastomosis ulceration circumferential scarring.  Nonbleeding cratered gastric ulcer with clean ulcer base in the prepyloric region of the stomach. Continue IV PPI, recommended 72 hours of IV PPI due to Forrest class III ulcer. Sucralfate  4 times daily. Repeat endoscopy, GI will plan.  Essential hypertension: Blood pressure stable.  Risk of hypotension.  Holding all antihypertensive medications.  Alcohol use disorder: High risk of withdrawals.  Currently uncomplicated.  Placed on CIWA protocol with benzodiazepine.  Continue multivitamins, thiamine  and folic acid .  Right posterior rib fractures: Incentive spirometry.  Continue supportive care.  Lidocaine  patch.    DVT prophylaxis: SCDs Start: 12/09/23 2357   Code  Status: Full code Family Communication: None at the bedside Disposition Plan: Status is: Inpatient Remains inpatient appropriate because: Immediate postprocedure.  IV Protonix .     Consultants:  Gastroenterology  Procedures:  Upper GI endoscopy  Antimicrobials:  None     Objective: Vitals:   12/11/23 0835 12/11/23 0840 12/11/23 0847 12/11/23 0924  BP: 121/71 126/71 127/72 131/86  Pulse: 89 81 75 67  Resp: 16 15 13 13   Temp:    98.1 F (36.7 C)  TempSrc:    Oral  SpO2: 100% 98% 98% 97%  Weight:      Height:        Intake/Output Summary (Last 24 hours) at 12/11/2023 1049 Last data filed at 12/11/2023 0828 Gross per 24 hour  Intake 2347.13 ml  Output 850 ml  Net 1497.13 ml   Filed Weights   12/09/23 1706 12/09/23 2333 12/11/23 0720  Weight: 72.6 kg 49.9 kg 49.9 kg    Examination:  General exam: Appears calm and comfortable.  Pleasant and interactive. Respiratory system: Clear to auscultation. Respiratory effort normal. Cardiovascular system: S1 & S2 heard, RRR. No JVD, murmurs, rubs, gallops or clicks. No pedal edema. Gastrointestinal system: Abdomen is nondistended, soft and nontender. No organomegaly or masses felt. Normal bowel sounds heard. Central nervous system: Alert and oriented. No focal neurological deficits. Extremities: Symmetric 5 x 5 power. Skin: No rashes, lesions or ulcers Psychiatry: Judgement and insight appear normal. Mood & affect appropriate.     Data Reviewed: I have personally reviewed following labs and imaging studies  CBC: Recent Labs  Lab 12/09/23 1742 12/10/23 0108 12/10/23 1646 12/11/23 0419  WBC 9.5 7.0  --  5.4  NEUTROABS  --   --   --  3.7  HGB 7.3* 6.1* 9.5* 10.2*  HCT 21.5* 19.4* 29.0* 31.0*  MCV 90.7 94.6  --  93.9  PLT 179 150  --  161   Basic Metabolic Panel: Recent Labs  Lab 12/09/23 1742 12/10/23 0108  NA 136 139  K 4.0 3.9  CL 102 107  CO2 23 24  GLUCOSE 131* 113*  BUN 36* 26*  CREATININE 0.78 0.73   CALCIUM 8.5* 8.3*   GFR: Estimated Creatinine Clearance: 65.8 mL/min (by C-G formula based on SCr of 0.73 mg/dL). Liver Function Tests: Recent Labs  Lab 12/09/23 1742  AST 48*  ALT 31  ALKPHOS 46  BILITOT 0.3  PROT 5.8*  ALBUMIN 3.8   Recent Labs  Lab 12/09/23 1742  LIPASE 21   No results for input(s): AMMONIA in the last 168 hours. Coagulation Profile: Recent Labs  Lab 12/11/23 0919  INR 1.1   Cardiac Enzymes: No results for input(s): CKTOTAL, CKMB, CKMBINDEX, TROPONINI in the last 168 hours. BNP (last 3 results) No results for input(s): PROBNP in the last 8760 hours. HbA1C: No results for input(s): HGBA1C in the last 72 hours. CBG: No results for input(s): GLUCAP in the last 168 hours. Lipid Profile: No results for input(s): CHOL, HDL, LDLCALC, TRIG, CHOLHDL, LDLDIRECT in the last 72 hours. Thyroid Function Tests: No results for input(s): TSH, T4TOTAL, FREET4, T3FREE, THYROIDAB in the last 72 hours. Anemia Panel: No results for input(s): VITAMINB12, FOLATE, FERRITIN, TIBC, IRON, RETICCTPCT in the last 72 hours. Sepsis Labs: Recent Labs  Lab 12/09/23 1743 12/09/23 1930  LATICACIDVEN 2.1* 2.2*    Recent Results (from the past 240 hours)  Resp panel by RT-PCR (RSV, Flu A&B, Covid) Anterior Nasal Swab     Status: None   Collection Time: 12/09/23  5:09 PM   Specimen: Anterior Nasal Swab  Result Value Ref Range Status   SARS Coronavirus 2 by RT PCR NEGATIVE NEGATIVE Final    Comment: (NOTE) SARS-CoV-2 target nucleic acids are NOT DETECTED.  The SARS-CoV-2 RNA is generally detectable in upper respiratory specimens during the acute phase of infection. The lowest concentration of SARS-CoV-2 viral copies this assay can detect is 138 copies/mL. A negative result does not preclude SARS-Cov-2 infection and should not be used as the sole basis for treatment or other patient management decisions. A negative result  may occur with  improper specimen collection/handling, submission of specimen other than nasopharyngeal swab, presence of viral mutation(s) within the areas targeted by this assay, and inadequate number of viral copies(<138 copies/mL). A negative result must be combined with clinical observations, patient history, and epidemiological information. The expected result is Negative.  Fact Sheet for Patients:  BloggerCourse.com  Fact Sheet for Healthcare Providers:  SeriousBroker.it  This test is no t yet approved or cleared by the [place]  FDA and  has been authorized for detection and/or diagnosis of SARS-CoV-2 by FDA under an Emergency Use Authorization (EUA). This EUA will remain  in effect (meaning this test can be used) for the duration of the COVID-19 declaration under Section 564(b)(1) of the Act, 21 U.S.C.section 360bbb-3(b)(1), unless the authorization is terminated  or revoked sooner.       Influenza A by PCR NEGATIVE NEGATIVE Final   Influenza B by PCR NEGATIVE NEGATIVE Final    Comment: (NOTE) The Xpert Xpress SARS-CoV-2/FLU/RSV plus assay is intended as an aid in the diagnosis of influenza from Nasopharyngeal swab specimens and should not be used as a sole basis for treatment. Nasal washings and  aspirates are unacceptable for Xpert Xpress SARS-CoV-2/FLU/RSV testing.  Fact Sheet for Patients: BloggerCourse.com  Fact Sheet for Healthcare Providers: SeriousBroker.it  This test is not yet approved or cleared by the [place]  FDA and has been authorized for detection and/or diagnosis of SARS-CoV-2 by FDA under an Emergency Use Authorization (EUA). This EUA will remain in effect (meaning this test can be used) for the duration of the COVID-19 declaration under Section 564(b)(1) of the Act, 21 U.S.C. section 360bbb-3(b)(1), unless the authorization is terminated  or revoked.     Resp Syncytial Virus by PCR NEGATIVE NEGATIVE Final    Comment: (NOTE) Fact Sheet for Patients: BloggerCourse.com  Fact Sheet for Healthcare Providers: SeriousBroker.it  This test is not yet approved or cleared by the [place]  FDA and has been authorized for detection and/or diagnosis of SARS-CoV-2 by FDA under an Emergency Use Authorization (EUA). This EUA will remain in effect (meaning this test can be used) for the duration of the COVID-19 declaration under Section 564(b)(1) of the Act, 21 U.S.C. section 360bbb-3(b)(1), unless the authorization is terminated or revoked.  Performed at [facility], [address] [place], [place] [address]   Culture, blood (Routine X 2) w Reflex to ID Panel     Status: None (Preliminary result)   Collection Time: 12/09/23  5:09 PM   Specimen: BLOOD  Result Value Ref Range Status   Specimen Description   Final    BLOOD RAC Performed at [facility], [address] [place], [place] [address]    Special Requests   Final    BOTTLES DRAWN AEROBIC AND ANAEROBIC BCAV Performed at [facility], [address], [place], [place] [address]    Culture   Final    NO GROWTH 2 DAYS Performed at [facility], [address] [place], [place] [address]    Report Status PENDING  Incomplete  Culture, blood (Routine X 2) w Reflex to ID Panel     Status: None (Preliminary result)   Collection Time: 12/09/23  5:43 PM   Specimen: BLOOD LEFT FOREARM  Result Value Ref Range Status   Specimen Description   Final    BLOOD LEFT FOREARM Performed at [facility], [address], [place], [place] [address]    Special Requests   Final    BOTTLES DRAWN AEROBIC AND ANAEROBIC BCHV Performed at [facility], [address] [place], [place] [address]    Culture   Final    NO GROWTH 2 DAYS Performed at [facility], [address] [place], [place] [address]    Report Status PENDING  Incomplete         Radiology Studies: ECHOCARDIOGRAM COMPLETE Result Date: 12/10/2023    ECHOCARDIOGRAM REPORT   Patient Name:   [facility] Date of Exam: 12/10/2023 Medical Rec #:  [id number]        Height:       67.0 in Accession #:    [id number]       Weight:       110.0 lb Date of Birth:  17-Mar-1961        BSA:          1.569 m Patient Age:    62 years         BP:           128/76 mmHg Patient Gender: M  HR:           72 bpm. Exam Location:  Inpatient Procedure: 2D Echo, 3D Echo, Cardiac Doppler, Color Doppler and Strain Analysis            (Both Spectral and Color Flow Doppler were utilized during            procedure). Indications:    Other abnormalities of the heart R00.8  History:        Patient has no prior history of Echocardiogram examinations.                 Risk Factors:Hypertension and Former Smoker.  Sonographer:    Koleen Popper RDCS Referring Phys: [id number] SARA-MAIZ A THOMAS  Sonographer Comments: Global longitudinal strain was attempted. IMPRESSIONS  1. Left ventricular ejection fraction, by estimation, is 50 to 55%. Left ventricular ejection fraction by 3D volume is 51 %. The left ventricle has low normal function. The left ventricle has no regional wall motion abnormalities. There is mild concentric left ventricular hypertrophy. Left ventricular diastolic parameters are indeterminate. The average left ventricular global longitudinal strain is -12.1 %. The global longitudinal strain is abnormal.  2. Right ventricular systolic function is normal. The right ventricular size is normal. There is moderately elevated pulmonary artery systolic pressure. The estimated right ventricular systolic pressure is 56.2 mmHg.  3. Right atrial size was mildly dilated.  4. The mitral valve is normal in structure. Trivial mitral valve regurgitation. No evidence of mitral stenosis.  5. The aortic valve is tricuspid. There is mild  calcification of the aortic valve. Aortic valve regurgitation is moderate. No aortic stenosis is present.  6. Aortic dilatation noted. There is mild dilatation of the aortic root, measuring 43 mm. There is mild dilatation of the ascending aorta, measuring 42 mm.  7. The inferior vena cava is dilated in size with >50% respiratory variability, suggesting right atrial pressure of 8 mmHg. FINDINGS  Left Ventricle: Left ventricular ejection fraction, by estimation, is 50 to 55%. Left ventricular ejection fraction by 3D volume is 51 %. The left ventricle has low normal function. The left ventricle has no regional wall motion abnormalities. The average left ventricular global longitudinal strain is -12.1 %. Strain was performed and the global longitudinal strain is abnormal. The left ventricular internal cavity size was normal in size. There is mild concentric left ventricular hypertrophy. Left  ventricular diastolic parameters are indeterminate. Right Ventricle: The right ventricular size is normal. No increase in right ventricular wall thickness. Right ventricular systolic function is normal. There is moderately elevated pulmonary artery systolic pressure. The tricuspid regurgitant velocity is 3.47 m/s, and with an assumed right atrial pressure of 8 mmHg, the estimated right ventricular systolic pressure is 56.2 mmHg. Left Atrium: Left atrial size was normal in size. Right Atrium: Right atrial size was mildly dilated. Pericardium: There is no evidence of pericardial effusion. Mitral Valve: The mitral valve is normal in structure. Trivial mitral valve regurgitation. No evidence of mitral valve stenosis. Tricuspid Valve: The tricuspid valve is normal in structure. Tricuspid valve regurgitation is mild. Aortic Valve: The aortic valve is tricuspid. There is mild calcification of the aortic valve. Aortic valve regurgitation is moderate. Aortic regurgitation PHT measures 274 msec. No aortic stenosis is present. Pulmonic Valve:  The pulmonic valve was normal in structure. Pulmonic valve regurgitation is trivial. Aorta: Aortic dilatation noted. There is mild dilatation of the aortic root, measuring 43 mm. There is mild dilatation of the ascending aorta, measuring 42 mm. Venous:  The inferior vena cava is dilated in size with greater than 50% respiratory variability, suggesting right atrial pressure of 8 mmHg. IAS/Shunts: No atrial level shunt detected by color flow Doppler. Additional Comments: 3D was performed not requiring image post processing on an independent workstation and was abnormal.  LEFT VENTRICLE PLAX 2D LVIDd:         4.10 cm         Diastology LVIDs:         3.50 cm         LV e' medial:    6.53 cm/s LV PW:         1.10 cm         LV E/e' medial:  8.9 LV IVS:        1.20 cm         LV e' lateral:   8.49 cm/s LVOT diam:     2.10 cm         LV E/e' lateral: 6.9 LV SV:         62 LV SV Index:   40              2D Longitudinal LVOT Area:     3.46 cm        Strain                                2D Strain GLS   -12.1 %                                Avg: LV Volumes (MOD) LV vol d, MOD    150.0 ml      3D Volume EF A2C:                           LV 3D EF:    Left LV vol s, MOD    82.3 ml                    ventricul A2C:                                        ar LV SV MOD A2C:   67.7 ml                    ejection                                             fraction                                             by 3D                                             volume is  51 %.                                 3D Volume EF:                                3D EF:        51 %                                LV EDV:       160 ml                                LV ESV:       79 ml                                LV SV:        81 ml RIGHT VENTRICLE             IVC RV Basal diam:  4.70 cm     IVC diam: 1.80 cm RV Mid diam:    3.40 cm RV S prime:     13.50 cm/s TAPSE (M-mode): 3.0 cm LEFT ATRIUM              Index        RIGHT ATRIUM           Index LA diam:        2.80 cm 1.79 cm/m   RA Area:     21.00 cm LA Vol (A2C):   58.8 ml 37.49 ml/m  RA Volume:   74.30 ml  47.37 ml/m LA Vol (A4C):   27.3 ml 17.40 ml/m LA Biplane Vol: 41.8 ml 26.65 ml/m  AORTIC VALVE LVOT Vmax:         90.40 cm/s LVOT Vmean:        60.400 cm/s LVOT VTI:          0.180 m AI PHT:            274 msec AR Vena Contracta: 0.70 cm  AORTA Ao Root diam: 4.30 cm Ao Asc diam:  4.20 cm MITRAL VALVE               TRICUSPID VALVE MV Area (PHT): 3.16 cm    TR Peak grad:   48.2 mmHg MV Decel Time: 240 msec    TR Vmax:        347.00 cm/s MV E velocity: 58.30 cm/s MV A velocity: 58.70 cm/s  SHUNTS MV E/A ratio:  0.99        Systemic VTI:  0.18 m                            Systemic Diam: 2.10 cm Dalton McleanMD Electronically signed by Ezra Kanner Signature Date/Time: 12/10/2023/7:58:38 PM    Final    CT CHEST ABDOMEN PELVIS W CONTRAST Result Date: 12/09/2023 EXAM: CT CHEST, ABDOMEN AND PELVIS WITH CONTRAST 12/09/2023 09:01:00 PM TECHNIQUE: CT of the chest, abdomen and pelvis was performed with the administration of intravenous contrast. Multiplanar reformatted images are provided for review. Automated exposure control, iterative reconstruction, and/or weight based  adjustment of the mA/kV was utilized to reduce the radiation dose to as low as reasonably achievable. COMPARISON: CT abdomen/pelvis dated 03/07/2018 and CT chest dated 04/21/2004. CLINICAL HISTORY: Respiratory illness, nondiagnostic xray. Pt states that he has had some dizziness and chills since Thursday. Family states that pt is out of his blood pressure medication. Pt also states pain in his ribs. Denies cough, fever. Pt states that he fell this morning after he stood up. Hx of ETOH. Last drank Wednesday. FINDINGS: CHEST: MEDIASTINUM AND LYMPH NODES: Status post esophagectomy with gastric pull-through. No mediastinal, hilar or axillary lymphadenopathy. LUNGS AND PLEURA: Scattered  peribronchovascular nodularity including chronic nodularity in the right upper lobe measuring up to 6 mm (image 35), benign. Bronchiectasis particularly in the inferior right upper lobe and superior segment left lower lobe, chronic. No new/suspicious pulmonary nodules. No pleural effusion or pneumothorax. ABDOMEN AND PELVIS: LIVER: The liver is unremarkable. GALLBLADDER AND BILE DUCTS: Gallbladder is unremarkable. No biliary ductal dilatation. SPLEEN: No acute abnormality. PANCREAS: No acute abnormality. ADRENAL GLANDS: No acute abnormality. KIDNEYS, URETERS AND BLADDER: No stones in the kidneys or ureters. No hydronephrosis. No perinephric or periureteral stranding. Mildly thick-walled bladder, underdistended. GI AND BOWEL: Normal appendix (image 92). Stomach demonstrates no acute abnormality. There is no bowel obstruction. REPRODUCTIVE ORGANS: Prostate is unremarkable. PERITONEUM AND RETROPERITONEUM: No ascites. No free air. VASCULATURE: Mild thoracic aortic atherosclerosis. Atherosclerotic calcifications of the abdominal aorta and branch vessels. ABDOMINAL AND PELVIS LYMPH NODES: No lymphadenopathy. REPRODUCTIVE ORGANS: No acute abnormality. BONES AND SOFT TISSUES: Acute segmental nondisplaced right posterior 11th and 12th rib fractures. Additional old chronic right rib fracture deformities. Mild degenerative changes at L1-2 and L5-S1. IMPRESSION: 1. Acute right posterior 11th and 12th rib fractures, as above. No pneumothorax. 2. Status post esophagectomy with gastric pull through. 3. Additional stable ancillary findings as above. Electronically signed by: Pinkie Pebbles MD 12/09/2023 09:12 PM EDT RP Workstation: [id number]   CT Head Wo Contrast Result Date: 12/09/2023 CLINICAL DATA:  Fall this morning. Dizziness and chills since Thursday. EXAM: CT HEAD WITHOUT CONTRAST CT CERVICAL SPINE WITHOUT CONTRAST TECHNIQUE: Multidetector CT imaging of the head and cervical spine was performed following the standard  protocol without intravenous contrast. Multiplanar CT image reconstructions of the cervical spine were also generated. RADIATION DOSE REDUCTION: This exam was performed according to the departmental dose-optimization program which includes automated exposure control, adjustment of the mA and/or kV according to patient size and/or use of iterative reconstruction technique. COMPARISON:  Head CT 03/10/2003, MRI cervical spine 04/26/2021 FINDINGS: CT HEAD FINDINGS Brain: Ventricles, cisterns and other CSF spaces are normal. Minimal chronic ischemic microvascular disease. No mass, mass effect, shift of midline structures or acute hemorrhage. No acute infarction. Vascular: No hyperdense vessel or unexpected calcification. Skull: Normal. Negative for fracture or focal lesion. Sinuses/Orbits: No acute finding. Other: None. CT CERVICAL SPINE FINDINGS Alignment: No posttraumatic subluxation. Skull base and vertebrae: Vertebral body heights are normal. There is mild spondylosis throughout the cervical spine to include uncovertebral joint spurring and facet arthropathy. Minimal right-sided neural foraminal narrowing at the C2-3 level. Moderate bilateral neural foraminal narrowing at the C3-4 level and C4-5 levels. Left-sided neural foraminal narrowing at the C5-6 level and bilateral neural foraminal narrowing at the C6-7 level. Minimal bilateral neural foraminal narrowing at the C7-T1 level. Several small scattered lucent lesions throughout the vertebral bodies of the visualized cervicothoracic spine. Soft tissues and spinal canal: Prevertebral soft tissues are normal. Spinal canal is unremarkable. Disc levels: Moderate disc space narrowing at  the C5-6, C6-7 and C7-T1 levels. Upper chest: Air collection over the right neck base lateral to the trachea at the T1-T3 level with suggestion of surgical sutures along the inferolateral periphery as this is likely postsurgical versus lung herniation. Other: None. IMPRESSION: 1. No acute  brain injury. 2. Minimal chronic ischemic microvascular disease. 3. No acute cervical spine injury. 4. Mild spondylosis throughout the cervical spine with multilevel disc disease and neural foraminal narrowing as described. 5. Several small scattered lucent lesions throughout the vertebral bodies of the visualized cervicothoracic spine. These are nonspecific and may be due to osteopenia, however metastatic disease or multiple myeloma is possible. 6. Air collection over the right neck base lateral to the trachea at the T1-T3 level with suggestion of surgical sutures along the inferolateral periphery as this is likely postsurgical versus lung herniation. Recommend correlation with clinical history. Electronically Signed   By: Toribio Agreste M.D.   On: 12/09/2023 18:35   CT Cervical Spine Wo Contrast Result Date: 12/09/2023 CLINICAL DATA:  Fall this morning. Dizziness and chills since Thursday. EXAM: CT HEAD WITHOUT CONTRAST CT CERVICAL SPINE WITHOUT CONTRAST TECHNIQUE: Multidetector CT imaging of the head and cervical spine was performed following the standard protocol without intravenous contrast. Multiplanar CT image reconstructions of the cervical spine were also generated. RADIATION DOSE REDUCTION: This exam was performed according to the departmental dose-optimization program which includes automated exposure control, adjustment of the mA and/or kV according to patient size and/or use of iterative reconstruction technique. COMPARISON:  Head CT 03/10/2003, MRI cervical spine 04/26/2021 FINDINGS: CT HEAD FINDINGS Brain: Ventricles, cisterns and other CSF spaces are normal. Minimal chronic ischemic microvascular disease. No mass, mass effect, shift of midline structures or acute hemorrhage. No acute infarction. Vascular: No hyperdense vessel or unexpected calcification. Skull: Normal. Negative for fracture or focal lesion. Sinuses/Orbits: No acute finding. Other: None. CT CERVICAL SPINE FINDINGS Alignment: No  posttraumatic subluxation. Skull base and vertebrae: Vertebral body heights are normal. There is mild spondylosis throughout the cervical spine to include uncovertebral joint spurring and facet arthropathy. Minimal right-sided neural foraminal narrowing at the C2-3 level. Moderate bilateral neural foraminal narrowing at the C3-4 level and C4-5 levels. Left-sided neural foraminal narrowing at the C5-6 level and bilateral neural foraminal narrowing at the C6-7 level. Minimal bilateral neural foraminal narrowing at the C7-T1 level. Several small scattered lucent lesions throughout the vertebral bodies of the visualized cervicothoracic spine. Soft tissues and spinal canal: Prevertebral soft tissues are normal. Spinal canal is unremarkable. Disc levels: Moderate disc space narrowing at the C5-6, C6-7 and C7-T1 levels. Upper chest: Air collection over the right neck base lateral to the trachea at the T1-T3 level with suggestion of surgical sutures along the inferolateral periphery as this is likely postsurgical versus lung herniation. Other: None. IMPRESSION: 1. No acute brain injury. 2. Minimal chronic ischemic microvascular disease. 3. No acute cervical spine injury. 4. Mild spondylosis throughout the cervical spine with multilevel disc disease and neural foraminal narrowing as described. 5. Several small scattered lucent lesions throughout the vertebral bodies of the visualized cervicothoracic spine. These are nonspecific and may be due to osteopenia, however metastatic disease or multiple myeloma is possible. 6. Air collection over the right neck base lateral to the trachea at the T1-T3 level with suggestion of surgical sutures along the inferolateral periphery as this is likely postsurgical versus lung herniation. Recommend correlation with clinical history. Electronically Signed   By: Toribio Agreste M.D.   On: 12/09/2023 18:35   DG Chest  Port 1 View Result Date: 12/09/2023 CLINICAL DATA:  Chest pain right-sided.   Fall today. EXAM: PORTABLE CHEST 1 VIEW COMPARISON:  06/20/2022 FINDINGS: Lungs are hyperexpanded without focal airspace consolidation or effusion. Cardiomediastinal silhouette is unchanged. 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 Remainder the exam is unchanged. IMPRESSION: 1. No acute cardiopulmonary disease. 2. Postsurgical change compatible with patient's esophagectomy/gastric pull-through procedure. Electronically Signed   By: Toribio Agreste M.D.   On: 12/09/2023 17:50        Scheduled Meds:  lidocaine   1 patch Transdermal Q24H   LORazepam   0-4 mg Intravenous Q6H   Or   LORazepam   0-4 mg Oral Q6H   [START ON 12/12/2023] LORazepam   0-4 mg Intravenous Q12H   Or   [START ON 12/12/2023] LORazepam   0-4 mg Oral Q12H   pantoprazole  (PROTONIX ) IV  40 mg Intravenous Q12H   sucralfate   1 g Oral TID WC & HS   thiamine   100 mg Oral Daily   Or   thiamine   100 mg Intravenous Daily   Continuous Infusions:  sodium chloride  10 mL/hr at 12/11/23 0803     LOS: 2 days    Time spent: 52 minutes    Renato Applebaum, MD [facility]

## 2023-12-12 ENCOUNTER — Other Ambulatory Visit: Payer: Self-pay

## 2023-12-12 ENCOUNTER — Other Ambulatory Visit (HOSPITAL_BASED_OUTPATIENT_CLINIC_OR_DEPARTMENT_OTHER): Payer: Self-pay

## 2023-12-12 ENCOUNTER — Encounter (HOSPITAL_COMMUNITY): Payer: Self-pay | Admitting: Gastroenterology

## 2023-12-12 DIAGNOSIS — D649 Anemia, unspecified: Secondary | ICD-10-CM | POA: Diagnosis not present

## 2023-12-12 LAB — CBC WITH DIFFERENTIAL/PLATELET
Abs Immature Granulocytes: 0.02 K/uL (ref 0.00–0.07)
Basophils Absolute: 0 K/uL (ref 0.0–0.1)
Basophils Relative: 0 %
Eosinophils Absolute: 0.1 K/uL (ref 0.0–0.5)
Eosinophils Relative: 1 %
HCT: 30.9 % — ABNORMAL LOW (ref 39.0–52.0)
Hemoglobin: 10.3 g/dL — ABNORMAL LOW (ref 13.0–17.0)
Immature Granulocytes: 0 %
Lymphocytes Relative: 18 %
Lymphs Abs: 1.2 K/uL (ref 0.7–4.0)
MCH: 30.8 pg (ref 26.0–34.0)
MCHC: 33.3 g/dL (ref 30.0–36.0)
MCV: 92.5 fL (ref 80.0–100.0)
Monocytes Absolute: 0.6 K/uL (ref 0.1–1.0)
Monocytes Relative: 10 %
Neutro Abs: 4.5 K/uL (ref 1.7–7.7)
Neutrophils Relative %: 71 %
Platelets: 183 K/uL (ref 150–400)
RBC: 3.34 MIL/uL — ABNORMAL LOW (ref 4.22–5.81)
RDW: 13.8 % (ref 11.5–15.5)
WBC: 6.4 K/uL (ref 4.0–10.5)
nRBC: 0 % (ref 0.0–0.2)

## 2023-12-12 LAB — SURGICAL PATHOLOGY

## 2023-12-12 MED ORDER — HYDROCODONE-ACETAMINOPHEN 5-325 MG PO TABS
1.0000 | ORAL_TABLET | Freq: Four times a day (QID) | ORAL | 0 refills | Status: DC | PRN
  Filled 2023-12-12: qty 20, 5d supply, fill #0

## 2023-12-12 MED ORDER — METOPROLOL SUCCINATE ER 50 MG PO TB24
50.0000 mg | ORAL_TABLET | Freq: Every day | ORAL | 11 refills | Status: AC
  Filled 2023-12-12: qty 30, 30d supply, fill #0
  Filled 2024-01-07: qty 30, 30d supply, fill #1
  Filled 2024-02-17: qty 30, 30d supply, fill #2
  Filled 2024-03-16 – 2024-04-18 (×3): qty 30, 30d supply, fill #3

## 2023-12-12 MED ORDER — PAROXETINE HCL 10 MG PO TABS
10.0000 mg | ORAL_TABLET | Freq: Every day | ORAL | 0 refills | Status: DC
  Filled 2023-12-12: qty 90, 90d supply, fill #0

## 2023-12-12 MED ORDER — SUCRALFATE 1 GM/10ML PO SUSP
1.0000 g | Freq: Three times a day (TID) | ORAL | 0 refills | Status: DC
  Filled 2023-12-12: qty 420, 11d supply, fill #0

## 2023-12-12 MED ORDER — PANTOPRAZOLE SODIUM 40 MG PO TBEC
40.0000 mg | DELAYED_RELEASE_TABLET | Freq: Two times a day (BID) | ORAL | 3 refills | Status: AC
  Filled 2023-12-12: qty 90, 45d supply, fill #0

## 2023-12-12 MED ORDER — DOCUSATE SODIUM 100 MG PO CAPS
100.0000 mg | ORAL_CAPSULE | Freq: Two times a day (BID) | ORAL | 0 refills | Status: DC
  Filled 2023-12-12: qty 10, 5d supply, fill #0

## 2023-12-12 MED ORDER — POLYETHYLENE GLYCOL 3350 17 G PO PACK
17.0000 g | PACK | Freq: Every day | ORAL | 0 refills | Status: AC
  Filled 2023-12-12: qty 14, 14d supply, fill #0

## 2023-12-12 MED ORDER — LIDOCAINE 5 % EX PTCH
1.0000 | MEDICATED_PATCH | CUTANEOUS | 0 refills | Status: AC
Start: 2023-12-12 — End: 2023-12-22
  Filled 2023-12-12: qty 10, 10d supply, fill #0

## 2023-12-12 NOTE — Discharge Summary (Signed)
 Physician Discharge Summary  Nathaniel Williams FMW:[id number] DOB: 01/09/1961 DOA: 12/09/2023  PCP: Dorina Loving, PA-C  Admit date: 12/09/2023 Discharge date: 12/12/2023  Admitted From: Home Disposition: Home  Recommendations for Outpatient Follow-up:  Follow up with PCP in 1-2 weeks Please obtain BMP/CBC in one week GI to schedule follow-up  Home Health: N/A Equipment/Devices: N/A  Discharge Condition: Stable CODE STATUS: Full code Diet recommendation: Low-salt diet  Discharge summary: 63 year old history of hypertension, GERD, alcohol use disorder, previous history of GI bleeding and iron deficiency anemia followed by hematology, 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 Black tarry stool since last 5 days. In the emergency room hemodynamically stable. Hemoglobin 7.3, was 13.1 last year. Platelets adequate. Lipase normal. Electrolytes are adequate. Admitted with acute GI bleeding. Also found to have right 11th and 12th rib fracture due to fall.  Acute upper GI bleeding, anemia of acute blood loss, symptomatic anemia: Received total 2 units of PRBC with clinical improvement.  Hemoglobin remains stable after transfusion.  Upper GI endoscopy today 9/23, esophagogastric anastomosis ulceration circumferential scarring.  Nonbleeding cratered gastric ulcer with clean ulcer base in the prepyloric region of the stomach. Treated with IV PPI for 72 hours.  Clinically improved.  GI recommends discharging home with Protonix  40 mg twice daily to continue, alcohol abstinence, sucralfate  4 times daily.   GI to schedule outpatient follow-up for repeat endoscopy.   Biopsies are pending.   Essential hypertension: Untreated at home.  Blood pressures elevated in the hospital.  Previously on metoprolol , hydralazine , lisinopril .  Will start patient on metoprolol , prescription provided.  Recommended to  follow-up with primary care physician.     Alcohol use disorder: Motivated to quit.  Did not show any evidence of withdrawals.   Counseled.   Right posterior rib fractures: Incentive spirometry.  Continue supportive care.  Lidocaine  patch.  Short course of oxycodone  was prescribed because patient cannot take any NSAIDs.  He is on room air.  Stable to discharge with outpatient follow-up.   Discharge Diagnoses:  Principal Problem:   Symptomatic anemia Active Problems:   Melena   Duodenal ulcer   Ulcer of esophagus without bleeding   Gastric ulcer   Anemia    Discharge Instructions  Discharge Instructions     Diet - low sodium heart healthy   Complete by: As directed    Increase activity slowly   Complete by: As directed       Allergies as of 12/12/2023   No Known Allergies      Medication List     STOP taking these medications    artificial tears ophthalmic solution   clobetasol cream 0.05 % Commonly known as: TEMOVATE   gabapentin  100 MG capsule Commonly known as: NEURONTIN    hydrALAZINE  50 MG tablet Commonly known as: APRESOLINE    lisinopril  40 MG tablet Commonly known as: ZESTRIL    ondansetron  4 MG disintegrating tablet Commonly known as: ZOFRAN -ODT   OSTEO BI-FLEX ADV TRIPLE ST PO   OVER THE COUNTER MEDICATION   potassium chloride  SA 20 MEQ tablet Commonly known as: KLOR-CON  M   senna-docusate 8.6-50 MG tablet Commonly known as: Senokot-S   SUPER B COMPLEX PO   TIGER BALM MUSCLE RUB EX   Turmeric Curcumin 500 MG Caps   vitamin C 1000 MG tablet   Vitamin D3 50 MCG (2000 UT) capsule   vitamin E 180 MG (400 UNITS) capsule  TAKE these medications    docusate sodium  100 MG capsule Commonly known as: COLACE Take 1 capsule (100 mg total) by mouth 2 (two) times daily.   HYDROcodone -acetaminophen  5-325 MG tablet Commonly known as: NORCO/VICODIN Take 1 tablet by mouth every 6 (six) hours as needed for moderate pain (pain score  4-6) or severe pain (pain score 7-10).   lidocaine  5 % Commonly known as: Lidoderm  Place 1 patch onto the skin daily for 10 days. Remove & Discard patch within 12 hours or as directed by MD What changed:  when to take this reasons to take this   metoprolol  succinate 50 MG 24 hr tablet Commonly known as: TOPROL -XL Take 1 tablet (50 mg total) by mouth daily. TAKE WITH OR IMMEDIATELY FOLLOWING A MEAL. What changed: Another medication with the same name was removed. Continue taking this medication, and follow the directions you see here.   pantoprazole  40 MG tablet Commonly known as: PROTONIX  Take 1 tablet (40 mg total) by mouth 2 (two) times daily. What changed: when to take this   PARoxetine  10 MG tablet Commonly known as: Paxil  Take 1 tablet (10 mg total) by mouth daily.   polyethylene glycol 17 g packet Commonly known as: MIRALAX  / GLYCOLAX  Take 17 g by mouth daily.   sucralfate  1 GM/10ML suspension Commonly known as: CARAFATE  Take 10 mLs (1 g total) by mouth 4 (four) times daily -  with meals and at bedtime.        No Known Allergies  Consultations: Gastroenterology   Procedures/Studies: ECHOCARDIOGRAM COMPLETE Result Date: 12/10/2023    ECHOCARDIOGRAM REPORT   Patient Name:   Nathaniel Williams Date of Exam: 12/10/2023 Medical Rec #:  [id number]        Height:       67.0 in Accession #:    [id number]       Weight:       110.0 lb Date of Birth:  [birth year]        BSA:          1.569 m Patient Age:    62 years         BP:           128/76 mmHg Patient Gender: M                HR:           72 bpm. Exam Location:  Inpatient Procedure: 2D Echo, 3D Echo, Cardiac Doppler, Color Doppler and Strain Analysis            (Both Spectral and Color Flow Doppler were utilized during            procedure). Indications:    Other abnormalities of the heart R00.8  History:        Patient has no prior history of Echocardiogram examinations.                 Risk Factors:Hypertension and Former  Smoker.  Sonographer:    Koleen Popper RDCS Referring Phys: [id number] SARA-MAIZ A THOMAS  Sonographer Comments: Global longitudinal strain was attempted. IMPRESSIONS  1. Left ventricular ejection fraction, by estimation, is 50 to 55%. Left ventricular ejection fraction by 3D volume is 51 %. The left ventricle has low normal function. The left ventricle has no regional wall motion abnormalities. There is mild concentric left ventricular hypertrophy. Left ventricular diastolic parameters are indeterminate. The average left ventricular global longitudinal strain is -12.1 %. The global longitudinal strain is abnormal.  2.  Right ventricular systolic function is normal. The right ventricular size is normal. There is moderately elevated pulmonary artery systolic pressure. The estimated right ventricular systolic pressure is 56.2 mmHg.  3. Right atrial size was mildly dilated.  4. The mitral valve is normal in structure. Trivial mitral valve regurgitation. No evidence of mitral stenosis.  5. The aortic valve is tricuspid. There is mild calcification of the aortic valve. Aortic valve regurgitation is moderate. No aortic stenosis is present.  6. Aortic dilatation noted. There is mild dilatation of the aortic root, measuring 43 mm. There is mild dilatation of the ascending aorta, measuring 42 mm.  7. The inferior vena cava is dilated in size with >50% respiratory variability, suggesting right atrial pressure of 8 mmHg. FINDINGS  Left Ventricle: Left ventricular ejection fraction, by estimation, is 50 to 55%. Left ventricular ejection fraction by 3D volume is 51 %. The left ventricle has low normal function. The left ventricle has no regional wall motion abnormalities. The average left ventricular global longitudinal strain is -12.1 %. Strain was performed and the global longitudinal strain is abnormal. The left ventricular internal cavity size was normal in size. There is mild concentric left ventricular hypertrophy. Left   ventricular diastolic parameters are indeterminate. Right Ventricle: The right ventricular size is normal. No increase in right ventricular wall thickness. Right ventricular systolic function is normal. There is moderately elevated pulmonary artery systolic pressure. The tricuspid regurgitant velocity is 3.47 m/s, and with an assumed right atrial pressure of 8 mmHg, the estimated right ventricular systolic pressure is 56.2 mmHg. Left Atrium: Left atrial size was normal in size. Right Atrium: Right atrial size was mildly dilated. Pericardium: There is no evidence of pericardial effusion. Mitral Valve: The mitral valve is normal in structure. Trivial mitral valve regurgitation. No evidence of mitral valve stenosis. Tricuspid Valve: The tricuspid valve is normal in structure. Tricuspid valve regurgitation is mild. Aortic Valve: The aortic valve is tricuspid. There is mild calcification of the aortic valve. Aortic valve regurgitation is moderate. Aortic regurgitation PHT measures 274 msec. No aortic stenosis is present. Pulmonic Valve: The pulmonic valve was normal in structure. Pulmonic valve regurgitation is trivial. Aorta: Aortic dilatation noted. There is mild dilatation of the aortic root, measuring 43 mm. There is mild dilatation of the ascending aorta, measuring 42 mm. Venous: The inferior vena cava is dilated in size with greater than 50% respiratory variability, suggesting right atrial pressure of 8 mmHg. IAS/Shunts: No atrial level shunt detected by color flow Doppler. Additional Comments: 3D was performed not requiring image post processing on an independent workstation and was abnormal.  LEFT VENTRICLE PLAX 2D LVIDd:         4.10 cm         Diastology LVIDs:         3.50 cm         LV e' medial:    6.53 cm/s LV PW:         1.10 cm         LV E/e' medial:  8.9 LV IVS:        1.20 cm         LV e' lateral:   8.49 cm/s LVOT diam:     2.10 cm         LV E/e' lateral: 6.9 LV SV:         62 LV SV Index:   40  2D Longitudinal LVOT Area:     3.46 cm        Strain                                2D Strain GLS   -12.1 %                                Avg: LV Volumes (MOD) LV vol d, MOD    150.0 ml      3D Volume EF A2C:                           LV 3D EF:    Left LV vol s, MOD    82.3 ml                    ventricul A2C:                                        ar LV SV MOD A2C:   67.7 ml                    ejection                                             fraction                                             by 3D                                             volume is                                             51 %.                                 3D Volume EF:                                3D EF:        51 %                                LV EDV:       160 ml                                LV ESV:       79 ml  LV SV:        81 ml RIGHT VENTRICLE             IVC RV Basal diam:  4.70 cm     IVC diam: 1.80 cm RV Mid diam:    3.40 cm RV S prime:     13.50 cm/s TAPSE (M-mode): 3.0 cm LEFT ATRIUM             Index        RIGHT ATRIUM           Index LA diam:        2.80 cm 1.79 cm/m   RA Area:     21.00 cm LA Vol (A2C):   58.8 ml 37.49 ml/m  RA Volume:   74.30 ml  47.37 ml/m LA Vol (A4C):   27.3 ml 17.40 ml/m LA Biplane Vol: 41.8 ml 26.65 ml/m  AORTIC VALVE LVOT Vmax:         90.40 cm/s LVOT Vmean:        60.400 cm/s LVOT VTI:          0.180 m AI PHT:            274 msec AR Vena Contracta: 0.70 cm  AORTA Ao Root diam: 4.30 cm Ao Asc diam:  4.20 cm MITRAL VALVE               TRICUSPID VALVE MV Area (PHT): 3.16 cm    TR Peak grad:   48.2 mmHg MV Decel Time: 240 msec    TR Vmax:        347.00 cm/s MV E velocity: 58.30 cm/s MV A velocity: 58.70 cm/s  SHUNTS MV E/A ratio:  0.99        Systemic VTI:  0.18 m                            Systemic Diam: 2.10 cm Dalton McleanMD Electronically signed by Ezra Kanner Signature Date/Time: 12/10/2023/7:58:38 PM    Final    CT CHEST ABDOMEN PELVIS  W CONTRAST Result Date: 12/09/2023 EXAM: CT CHEST, ABDOMEN AND PELVIS WITH CONTRAST 12/09/2023 09:01:00 PM TECHNIQUE: CT of the chest, abdomen and pelvis was performed with the administration of intravenous contrast. Multiplanar reformatted images are provided for review. Automated exposure control, iterative reconstruction, and/or weight based adjustment of the mA/kV was utilized to reduce the radiation dose to as low as reasonably achievable. COMPARISON: CT abdomen/pelvis dated 03/07/2018 and CT chest dated 04/21/2004. CLINICAL HISTORY: Respiratory illness, nondiagnostic xray. Pt states that he has had some dizziness and chills since Thursday. Family states that pt is out of his blood pressure medication. Pt also states pain in his ribs. Denies cough, fever. Pt states that he fell this morning after he stood up. Hx of ETOH. Last drank Wednesday. FINDINGS: CHEST: MEDIASTINUM AND LYMPH NODES: Status post esophagectomy with gastric pull-through. No mediastinal, hilar or axillary lymphadenopathy. LUNGS AND PLEURA: Scattered peribronchovascular nodularity including chronic nodularity in the right upper lobe measuring up to 6 mm (image 35), benign. Bronchiectasis particularly in the inferior right upper lobe and superior segment left lower lobe, chronic. No new/suspicious pulmonary nodules. No pleural effusion or pneumothorax. ABDOMEN AND PELVIS: LIVER: The liver is unremarkable. GALLBLADDER AND BILE DUCTS: Gallbladder is unremarkable. No biliary ductal dilatation. SPLEEN: No acute abnormality. PANCREAS: No acute abnormality. ADRENAL GLANDS: No acute abnormality. KIDNEYS, URETERS AND BLADDER: No stones in the kidneys or ureters. No hydronephrosis.  No perinephric or periureteral stranding. Mildly thick-walled bladder, underdistended. GI AND BOWEL: Normal appendix (image 92). Stomach demonstrates no acute abnormality. There is no bowel obstruction. REPRODUCTIVE ORGANS: Prostate is unremarkable. PERITONEUM AND  RETROPERITONEUM: No ascites. No free air. VASCULATURE: Mild thoracic aortic atherosclerosis. Atherosclerotic calcifications of the abdominal aorta and branch vessels. ABDOMINAL AND PELVIS LYMPH NODES: No lymphadenopathy. REPRODUCTIVE ORGANS: No acute abnormality. BONES AND SOFT TISSUES: Acute segmental nondisplaced right posterior 11th and 12th rib fractures. Additional old chronic right rib fracture deformities. Mild degenerative changes at L1-2 and L5-S1. IMPRESSION: 1. Acute right posterior 11th and 12th rib fractures, as above. No pneumothorax. 2. Status post esophagectomy with gastric pull through. 3. Additional stable ancillary findings as above. Electronically signed by: Pinkie Pebbles MD 12/09/2023 09:12 PM EDT RP Workstation: [id number]   CT Head Wo Contrast Result Date: 12/09/2023 CLINICAL DATA:  Fall this morning. Dizziness and chills since Thursday. EXAM: CT HEAD WITHOUT CONTRAST CT CERVICAL SPINE WITHOUT CONTRAST TECHNIQUE: Multidetector CT imaging of the head and cervical spine was performed following the standard protocol without intravenous contrast. Multiplanar CT image reconstructions of the cervical spine were also generated. RADIATION DOSE REDUCTION: This exam was performed according to the departmental dose-optimization program which includes automated exposure control, adjustment of the mA and/or kV according to patient size and/or use of iterative reconstruction technique. COMPARISON:  Head CT 03/10/2003, MRI cervical spine 04/26/2021 FINDINGS: CT HEAD FINDINGS Brain: Ventricles, cisterns and other CSF spaces are normal. Minimal chronic ischemic microvascular disease. No mass, mass effect, shift of midline structures or acute hemorrhage. No acute infarction. Vascular: No hyperdense vessel or unexpected calcification. Skull: Normal. Negative for fracture or focal lesion. Sinuses/Orbits: No acute finding. Other: None. CT CERVICAL SPINE FINDINGS Alignment: No posttraumatic subluxation. Skull  base and vertebrae: Vertebral body heights are normal. There is mild spondylosis throughout the cervical spine to include uncovertebral joint spurring and facet arthropathy. Minimal right-sided neural foraminal narrowing at the C2-3 level. Moderate bilateral neural foraminal narrowing at the C3-4 level and C4-5 levels. Left-sided neural foraminal narrowing at the C5-6 level and bilateral neural foraminal narrowing at the C6-7 level. Minimal bilateral neural foraminal narrowing at the C7-T1 level. Several small scattered lucent lesions throughout the vertebral bodies of the visualized cervicothoracic spine. Soft tissues and spinal canal: Prevertebral soft tissues are normal. Spinal canal is unremarkable. Disc levels: Moderate disc space narrowing at the C5-6, C6-7 and C7-T1 levels. Upper chest: Air collection over the right neck base lateral to the trachea at the T1-T3 level with suggestion of surgical sutures along the inferolateral periphery as this is likely postsurgical versus lung herniation. Other: None. IMPRESSION: 1. No acute brain injury. 2. Minimal chronic ischemic microvascular disease. 3. No acute cervical spine injury. 4. Mild spondylosis throughout the cervical spine with multilevel disc disease and neural foraminal narrowing as described. 5. Several small scattered lucent lesions throughout the vertebral bodies of the visualized cervicothoracic spine. These are nonspecific and may be due to osteopenia, however metastatic disease or multiple myeloma is possible. 6. Air collection over the right neck base lateral to the trachea at the T1-T3 level with suggestion of surgical sutures along the inferolateral periphery as this is likely postsurgical versus lung herniation. Recommend correlation with clinical history. Electronically Signed   By: Toribio Agreste M.D.   On: 12/09/2023 18:35   CT Cervical Spine Wo Contrast Result Date: 12/09/2023 CLINICAL DATA:  Fall this morning. Dizziness and chills since  Thursday. EXAM: CT HEAD WITHOUT CONTRAST CT CERVICAL SPINE WITHOUT  CONTRAST TECHNIQUE: Multidetector CT imaging of the head and cervical spine was performed following the standard protocol without intravenous contrast. Multiplanar CT image reconstructions of the cervical spine were also generated. RADIATION DOSE REDUCTION: This exam was performed according to the departmental dose-optimization program which includes automated exposure control, adjustment of the mA and/or kV according to patient size and/or use of iterative reconstruction technique. COMPARISON:  Head CT 03/10/2003, MRI cervical spine 04/26/2021 FINDINGS: CT HEAD FINDINGS Brain: Ventricles, cisterns and other CSF spaces are normal. Minimal chronic ischemic microvascular disease. No mass, mass effect, shift of midline structures or acute hemorrhage. No acute infarction. Vascular: No hyperdense vessel or unexpected calcification. Skull: Normal. Negative for fracture or focal lesion. Sinuses/Orbits: No acute finding. Other: None. CT CERVICAL SPINE FINDINGS Alignment: No posttraumatic subluxation. Skull base and vertebrae: Vertebral body heights are normal. There is mild spondylosis throughout the cervical spine to include uncovertebral joint spurring and facet arthropathy. Minimal right-sided neural foraminal narrowing at the C2-3 level. Moderate bilateral neural foraminal narrowing at the C3-4 level and C4-5 levels. Left-sided neural foraminal narrowing at the C5-6 level and bilateral neural foraminal narrowing at the C6-7 level. Minimal bilateral neural foraminal narrowing at the C7-T1 level. Several small scattered lucent lesions throughout the vertebral bodies of the visualized cervicothoracic spine. Soft tissues and spinal canal: Prevertebral soft tissues are normal. Spinal canal is unremarkable. Disc levels: Moderate disc space narrowing at the C5-6, C6-7 and C7-T1 levels. Upper chest: Air collection over the right neck base lateral to the trachea  at the T1-T3 level with suggestion of surgical sutures along the inferolateral periphery as this is likely postsurgical versus lung herniation. Other: None. IMPRESSION: 1. No acute brain injury. 2. Minimal chronic ischemic microvascular disease. 3. No acute cervical spine injury. 4. Mild spondylosis throughout the cervical spine with multilevel disc disease and neural foraminal narrowing as described. 5. Several small scattered lucent lesions throughout the vertebral bodies of the visualized cervicothoracic spine. These are nonspecific and may be due to osteopenia, however metastatic disease or multiple myeloma is possible. 6. Air collection over the right neck base lateral to the trachea at the T1-T3 level with suggestion of surgical sutures along the inferolateral periphery as this is likely postsurgical versus lung herniation. Recommend correlation with clinical history. Electronically Signed   By: Toribio Agreste M.D.   On: 12/09/2023 18:35   DG Chest Port 1 View Result Date: 12/09/2023 CLINICAL DATA:  Chest pain right-sided.  Fall today. EXAM: PORTABLE CHEST 1 VIEW COMPARISON:  06/20/2022 FINDINGS: Lungs are hyperexpanded without focal airspace consolidation or effusion. Cardiomediastinal silhouette is unchanged. 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 Remainder the exam is unchanged. IMPRESSION: 1. No acute cardiopulmonary disease. 2. Postsurgical change compatible with patient's esophagectomy/gastric pull-through procedure. Electronically Signed   By: Toribio Agreste M.D.   On: 12/09/2023 17:50   (Echo, Carotid, EGD, Colonoscopy, ERCP)    Subjective: Patient seen and examined.  No overnight events.  Still has moderate pain on deep breathing on the right side.  Denies any other complaints.  Feels comfortable to go home.  Denies any nausea or vomiting.  Eating regular food.   Discharge Exam: Vitals:   12/12/23 0813 12/12/23 1013  BP: (!) 173/95 134/74   Pulse: 71 94  Resp:  20  Temp: 98.5 F (36.9 C)   SpO2: 100%    Vitals:   12/11/23 1957 12/12/23 0451 12/12/23 0813 12/12/23 1013  BP: 119/78 (!) 153/89 (!) 173/95 134/74  Pulse: 83 72 71  94  Resp: 16 16  20   Temp: 98 F (36.7 C) 98.6 F (37 C) 98.5 F (36.9 C)   TempSrc: Oral Oral    SpO2: 99% 100% 100%   Weight:      Height:        General: Pt is alert, awake, not in acute distress Cardiovascular: RRR, S1/S2 +, no rubs, no gallops Respiratory: CTA bilaterally, no wheezing, no rhonchi, mild tenderness on the right lower chest wall on palpation. Abdominal: Soft, NT, ND, bowel sounds + Extremities: no edema, no cyanosis    The results of significant diagnostics from this hospitalization (including imaging, microbiology, ancillary and laboratory) are listed below for reference.     Microbiology: Recent Results (from the past 240 hours)  Resp panel by RT-PCR (RSV, Flu A&B, Covid) Anterior Nasal Swab     Status: None   Collection Time: 12/09/23  5:09 PM   Specimen: Anterior Nasal Swab  Result Value Ref Range Status   SARS Coronavirus 2 by RT PCR NEGATIVE NEGATIVE Final    Comment: (NOTE) SARS-CoV-2 target nucleic acids are NOT DETECTED.  The SARS-CoV-2 RNA is generally detectable in upper respiratory specimens during the acute phase of infection. The lowest concentration of SARS-CoV-2 viral copies this assay can detect is 138 copies/mL. A negative result does not preclude SARS-Cov-2 infection and should not be used as the sole basis for treatment or other patient management decisions. A negative result may occur with  improper specimen collection/handling, submission of specimen other than nasopharyngeal swab, presence of viral mutation(s) within the areas targeted by this assay, and inadequate number of viral copies(<138 copies/mL). A negative result must be combined with clinical observations, patient history, and epidemiological information. The expected result  is Negative.  Fact Sheet for Patients:  BloggerCourse.com  Fact Sheet for Healthcare Providers:  SeriousBroker.it  This test is no t yet approved or cleared by the [place]  FDA and  has been authorized for detection and/or diagnosis of SARS-CoV-2 by FDA under an Emergency Use Authorization (EUA). This EUA will remain  in effect (meaning this test can be used) for the duration of the COVID-19 declaration under Section 564(b)(1) of the Act, 21 U.S.C.section 360bbb-3(b)(1), unless the authorization is terminated  or revoked sooner.       Influenza A by PCR NEGATIVE NEGATIVE Final   Influenza B by PCR NEGATIVE NEGATIVE Final    Comment: (NOTE) The Xpert Xpress SARS-CoV-2/FLU/RSV plus assay is intended as an aid in the diagnosis of influenza from Nasopharyngeal swab specimens and should not be used as a sole basis for treatment. Nasal washings and aspirates are unacceptable for Xpert Xpress SARS-CoV-2/FLU/RSV testing.  Fact Sheet for Patients: BloggerCourse.com  Fact Sheet for Healthcare Providers: SeriousBroker.it  This test is not yet approved or cleared by the [place]  FDA and has been authorized for detection and/or diagnosis of SARS-CoV-2 by FDA under an Emergency Use Authorization (EUA). This EUA will remain in effect (meaning this test can be used) for the duration of the COVID-19 declaration under Section 564(b)(1) of the Act, 21 U.S.C. section 360bbb-3(b)(1), unless the authorization is terminated or revoked.     Resp Syncytial Virus by PCR NEGATIVE NEGATIVE Final    Comment: (NOTE) Fact Sheet for Patients: BloggerCourse.com  Fact Sheet for Healthcare Providers: SeriousBroker.it  This test is not yet approved or cleared by the [place]  FDA and has been authorized for detection and/or diagnosis of  SARS-CoV-2 by FDA under an Emergency Use Authorization (EUA). This  EUA will remain in effect (meaning this test can be used) for the duration of the COVID-19 declaration under Section 564(b)(1) of the Act, 21 U.S.C. section 360bbb-3(b)(1), unless the authorization is terminated or revoked.  Performed at [facility], [address] [place], [place] [address]   Culture, blood (Routine X 2) w Reflex to ID Panel     Status: None (Preliminary result)   Collection Time: 12/09/23  5:09 PM   Specimen: BLOOD  Result Value Ref Range Status   Specimen Description   Final    BLOOD RAC Performed at [facility], [address] [place], [place] [address]    Special Requests   Final    BOTTLES DRAWN AEROBIC AND ANAEROBIC BCAV Performed at [facility], [address], [place], [place] [address]    Culture   Final    NO GROWTH 3 DAYS Performed at [facility], [address], [place], [place] [address]    Report Status PENDING  Incomplete  Culture, blood (Routine X 2) w Reflex to ID Panel     Status: None (Preliminary result)   Collection Time: 12/09/23  5:43 PM   Specimen: BLOOD LEFT FOREARM  Result Value Ref Range Status   Specimen Description   Final    BLOOD LEFT FOREARM Performed at [facility], [address], [place], [place] [address]    Special Requests   Final    BOTTLES DRAWN AEROBIC AND ANAEROBIC BCHV Performed at [facility], [address] [place], [place] [address]    Culture   Final    NO GROWTH 3 DAYS Performed at [facility], [address], [place], [place] [address]    Report Status PENDING  Incomplete     Labs: BNP (last 3 results) No results for input(s): BNP in the last 8760 hours. Basic Metabolic Panel: Recent Labs  Lab 12/09/23 1742 12/10/23 0108  NA 136 139  K 4.0 3.9  CL 102 107  CO2 23 24  GLUCOSE 131* 113*  BUN 36* 26*  CREATININE 0.78 0.73  CALCIUM 8.5* 8.3*   Liver  Function Tests: Recent Labs  Lab 12/09/23 1742  AST 48*  ALT 31  ALKPHOS 46  BILITOT 0.3  PROT 5.8*  ALBUMIN 3.8   Recent Labs  Lab 12/09/23 1742  LIPASE 21   No results for input(s): AMMONIA in the last 168 hours. CBC: Recent Labs  Lab 12/09/23 1742 12/10/23 0108 12/10/23 1646 12/11/23 0419 12/12/23 0419  WBC 9.5 7.0  --  5.4 6.4  NEUTROABS  --   --   --  3.7 4.5  HGB 7.3* 6.1* 9.5* 10.2* 10.3*  HCT 21.5* 19.4* 29.0* 31.0* 30.9*  MCV 90.7 94.6  --  93.9 92.5  PLT 179 150  --  161 183   Cardiac Enzymes: No results for input(s): CKTOTAL, CKMB, CKMBINDEX, TROPONINI in the last 168 hours. BNP: Invalid input(s): POCBNP CBG: No results for input(s): GLUCAP in the last 168 hours. D-Dimer No results for input(s): DDIMER in the last 72 hours. Hgb A1c No results for input(s): HGBA1C in the last 72 hours. Lipid Profile No results for input(s): CHOL, HDL, LDLCALC, TRIG, CHOLHDL, LDLDIRECT in the last 72 hours. Thyroid function studies No results for input(s): TSH, T4TOTAL, T3FREE, THYROIDAB in the last 72 hours.  Invalid input(s): FREET3 Anemia work up No results for input(s): VITAMINB12, FOLATE, FERRITIN, TIBC, IRON, RETICCTPCT  in the last 72 hours. Urinalysis    Component Value Date/Time   COLORURINE YELLOW 12/09/2023 1742   APPEARANCEUR CLEAR 12/09/2023 1742   LABSPEC 1.015 12/09/2023 1742   PHURINE 6.0 12/09/2023 1742   GLUCOSEU NEGATIVE 12/09/2023 1742   HGBUR NEGATIVE 12/09/2023 1742   BILIRUBINUR NEGATIVE 12/09/2023 1742   BILIRUBINUR 1 04/26/2020 1034   KETONESUR NEGATIVE 12/09/2023 1742   PROTEINUR NEGATIVE 12/09/2023 1742   UROBILINOGEN 1.0 04/26/2020 1034   NITRITE NEGATIVE 12/09/2023 1742   LEUKOCYTESUR NEGATIVE 12/09/2023 1742   Sepsis Labs Recent Labs  Lab 12/09/23 1742 12/10/23 0108 12/11/23 0419 12/12/23 0419  WBC 9.5 7.0 5.4 6.4   Microbiology Recent Results (from the past 240 hours)   Resp panel by RT-PCR (RSV, Flu A&B, Covid) Anterior Nasal Swab     Status: None   Collection Time: 12/09/23  5:09 PM   Specimen: Anterior Nasal Swab  Result Value Ref Range Status   SARS Coronavirus 2 by RT PCR NEGATIVE NEGATIVE Final    Comment: (NOTE) SARS-CoV-2 target nucleic acids are NOT DETECTED.  The SARS-CoV-2 RNA is generally detectable in upper respiratory specimens during the acute phase of infection. The lowest concentration of SARS-CoV-2 viral copies this assay can detect is 138 copies/mL. A negative result does not preclude SARS-Cov-2 infection and should not be used as the sole basis for treatment or other patient management decisions. A negative result may occur with  improper specimen collection/handling, submission of specimen other than nasopharyngeal swab, presence of viral mutation(s) within the areas targeted by this assay, and inadequate number of viral copies(<138 copies/mL). A negative result must be combined with clinical observations, patient history, and epidemiological information. The expected result is Negative.  Fact Sheet for Patients:  BloggerCourse.com  Fact Sheet for Healthcare Providers:  SeriousBroker.it  This test is no t yet approved or cleared by the [place]  FDA and  has been authorized for detection and/or diagnosis of SARS-CoV-2 by FDA under an Emergency Use Authorization (EUA). This EUA will remain  in effect (meaning this test can be used) for the duration of the COVID-19 declaration under Section 564(b)(1) of the Act, 21 U.S.C.section 360bbb-3(b)(1), unless the authorization is terminated  or revoked sooner.       Influenza A by PCR NEGATIVE NEGATIVE Final   Influenza B by PCR NEGATIVE NEGATIVE Final    Comment: (NOTE) The Xpert Xpress SARS-CoV-2/FLU/RSV plus assay is intended as an aid in the diagnosis of influenza from Nasopharyngeal swab specimens and should not be used  as a sole basis for treatment. Nasal washings and aspirates are unacceptable for Xpert Xpress SARS-CoV-2/FLU/RSV testing.  Fact Sheet for Patients: BloggerCourse.com  Fact Sheet for Healthcare Providers: SeriousBroker.it  This test is not yet approved or cleared by the [place]  FDA and has been authorized for detection and/or diagnosis of SARS-CoV-2 by FDA under an Emergency Use Authorization (EUA). This EUA will remain in effect (meaning this test can be used) for the duration of the COVID-19 declaration under Section 564(b)(1) of the Act, 21 U.S.C. section 360bbb-3(b)(1), unless the authorization is terminated or revoked.     Resp Syncytial Virus by PCR NEGATIVE NEGATIVE Final    Comment: (NOTE) Fact Sheet for Patients: BloggerCourse.com  Fact Sheet for Healthcare Providers: SeriousBroker.it  This test is not yet approved or cleared by the [place]  FDA and has been authorized for detection and/or diagnosis of SARS-CoV-2 by FDA under an Emergency Use Authorization (EUA). This EUA will remain in effect (meaning this  test can be used) for the duration of the COVID-19 declaration under Section 564(b)(1) of the Act, 21 U.S.C. section 360bbb-3(b)(1), unless the authorization is terminated or revoked.  Performed at [facility], [address] [place], [place] [address]   Culture, blood (Routine X 2) w Reflex to ID Panel     Status: None (Preliminary result)   Collection Time: 12/09/23  5:09 PM   Specimen: BLOOD  Result Value Ref Range Status   Specimen Description   Final    BLOOD RAC Performed at [facility], [address] [place], [place] [address]    Special Requests   Final    BOTTLES DRAWN AEROBIC AND ANAEROBIC BCAV Performed at [facility], [address], [place], [place] [address]    Culture   Final    NO GROWTH 3  DAYS Performed at [facility], [address] [place], [place] [address]    Report Status PENDING  Incomplete  Culture, blood (Routine X 2) w Reflex to ID Panel     Status: None (Preliminary result)   Collection Time: 12/09/23  5:43 PM   Specimen: BLOOD LEFT FOREARM  Result Value Ref Range Status   Specimen Description   Final    BLOOD LEFT FOREARM Performed at [facility], [address], [place], [place] [address]    Special Requests   Final    BOTTLES DRAWN AEROBIC AND ANAEROBIC BCHV Performed at [facility], [address] [place], [place] [address]    Culture   Final    NO GROWTH 3 DAYS Performed at [facility], [address] [place], [place] [address]    Report Status PENDING  Incomplete     Time coordinating discharge: 35 minutes  SIGNED:   Renato Applebaum, MD  [facility] 12/12/2023, 1:04 PM

## 2023-12-12 NOTE — Plan of Care (Signed)

## 2023-12-13 ENCOUNTER — Telehealth: Payer: Self-pay

## 2023-12-13 ENCOUNTER — Telehealth: Payer: Self-pay | Admitting: *Deleted

## 2023-12-13 ENCOUNTER — Ambulatory Visit: Payer: Self-pay | Admitting: Gastroenterology

## 2023-12-13 NOTE — Transitions of Care (Post Inpatient/ED Visit) (Signed)
   12/13/2023  Name: Nathaniel Williams MRN: [id number] DOB: October 07, 1960  Today's TOC FU Call Status: Today's TOC FU Call Status:: Successful TOC FU Call Completed TOC FU Call Complete Date: 12/13/23 Patient's Name and Date of Birth confirmed.  Transition Care Management Follow-up Telephone Call Date of Discharge: 12/12/23 Discharge Facility: [facility]) Type of Discharge: Inpatient Admission Primary Inpatient Discharge Diagnosis:: GI bleed How have you been since you were released from the hospital?: Better Any questions or concerns?: No  Items Reviewed: Did you receive and understand the discharge instructions provided?: Yes Medications obtained,verified, and reconciled?: Yes (Medications Reviewed) Any new allergies since your discharge?: No Dietary orders reviewed?: Yes Do you have support at home?: No  Medications Reviewed Today: Medications Reviewed Today     Reviewed by Emmitt Pan, LPN (Licensed Practical Nurse) on 12/13/23 at 1216  Med List Status: <None>   Medication Order Taking? Sig Documenting Provider Last Dose Status Informant  docusate sodium  (COLACE) 100 MG capsule [id number] Yes Take 1 capsule (100 mg total) by mouth 2 (two) times daily. Raenelle Coria, MD  Active   HYDROcodone -acetaminophen  (NORCO/VICODIN) 5-325 MG tablet [id number] Yes Take 1 tablet by mouth every 6 (six) hours as needed for moderate pain (pain score 4-6) or severe pain (pain score 7-10). Raenelle Coria, MD  Active   lidocaine  (LIDODERM ) 5 % [id number] Yes Place 1 patch onto the skin daily for 10 days. Remove & Discard patch within 12 hours or as directed by MD Raenelle Coria, MD  Active   metoprolol  succinate (TOPROL -XL) 50 MG 24 hr tablet [id number] Yes Take 1 tablet (50 mg total) by mouth daily. TAKE WITH OR IMMEDIATELY FOLLOWING A MEAL. Raenelle Coria, MD  Active   pantoprazole  (PROTONIX ) 40 MG tablet [id number] Yes Take 1 tablet (40 mg total) by mouth 2 (two) times daily. Raenelle Coria, MD  Active   PARoxetine  (PAXIL ) 10 MG tablet [id number] Yes Take 1 tablet (10 mg total) by mouth daily. Ghimire, Kuber, MD  Active   polyethylene glycol (MIRALAX  / GLYCOLAX ) 17 g packet [id number] Yes Take 17 g by mouth daily. Raenelle Coria, MD  Active   sucralfate  (CARAFATE ) 1 GM/10ML suspension [id number] Yes Take 10 mLs (1 g total) by mouth 4 (four) times daily -  with meals and at bedtime. Raenelle Coria, MD  Active             Home Care and Equipment/Supplies: Were Home Health Services Ordered?: NA Any new equipment or medical supplies ordered?: NA  Functional Questionnaire: Do you need assistance with bathing/showering or dressing?: No Do you need assistance with meal preparation?: No Do you need assistance with eating?: No Do you have difficulty maintaining continence: No Do you need assistance with getting out of bed/getting out of a chair/moving?: No Do you have difficulty managing or taking your medications?: No  Follow up appointments reviewed: PCP Follow-up appointment confirmed?: Yes Date of PCP follow-up appointment?: 12/18/23 Follow-up Provider[facility] Follow-up appointment confirmed?: NA Do you need transportation to your follow-up appointment?: No Do you understand care options if your condition(s) worsen?: Yes-patient verbalized understanding    SIGNATURE Pan Emmitt, LPN [facility] Nurse Health Advisor Direct Dial [phone]

## 2023-12-13 NOTE — Telephone Encounter (Signed)
 Spoke to patient in regards to recent enteroscpy and pathology findings as per Dr Wilhelmenia. Discussed the need for repeat endoscopy in 6-10 weeks. Patient has scheduled endoscopy in [facility] on 02/06/24 and previsit on 01/14/24.

## 2023-12-14 LAB — CULTURE, BLOOD (ROUTINE X 2)
Culture: NO GROWTH
Culture: NO GROWTH

## 2023-12-18 ENCOUNTER — Ambulatory Visit (INDEPENDENT_AMBULATORY_CARE_PROVIDER_SITE_OTHER): Admitting: Medical

## 2023-12-18 ENCOUNTER — Other Ambulatory Visit (HOSPITAL_BASED_OUTPATIENT_CLINIC_OR_DEPARTMENT_OTHER): Payer: Self-pay

## 2023-12-18 ENCOUNTER — Ambulatory Visit: Payer: Self-pay | Admitting: Medical

## 2023-12-18 VITALS — BP 128/82 | HR 51 | Temp 98.2°F | Resp 16 | Ht 67.0 in | Wt 125.2 lb

## 2023-12-18 DIAGNOSIS — F32A Depression, unspecified: Secondary | ICD-10-CM

## 2023-12-18 DIAGNOSIS — K269 Duodenal ulcer, unspecified as acute or chronic, without hemorrhage or perforation: Secondary | ICD-10-CM

## 2023-12-18 DIAGNOSIS — D649 Anemia, unspecified: Secondary | ICD-10-CM | POA: Diagnosis not present

## 2023-12-18 DIAGNOSIS — K259 Gastric ulcer, unspecified as acute or chronic, without hemorrhage or perforation: Secondary | ICD-10-CM | POA: Diagnosis not present

## 2023-12-18 DIAGNOSIS — I10 Essential (primary) hypertension: Secondary | ICD-10-CM | POA: Diagnosis not present

## 2023-12-18 DIAGNOSIS — F419 Anxiety disorder, unspecified: Secondary | ICD-10-CM

## 2023-12-18 DIAGNOSIS — F1090 Alcohol use, unspecified, uncomplicated: Secondary | ICD-10-CM

## 2023-12-18 DIAGNOSIS — R9389 Abnormal findings on diagnostic imaging of other specified body structures: Secondary | ICD-10-CM

## 2023-12-18 DIAGNOSIS — K111 Hypertrophy of salivary gland: Secondary | ICD-10-CM

## 2023-12-18 DIAGNOSIS — S2241XS Multiple fractures of ribs, right side, sequela: Secondary | ICD-10-CM

## 2023-12-18 LAB — CBC WITH DIFFERENTIAL/PLATELET
Basophils Absolute: 0 K/uL (ref 0.0–0.1)
Basophils Relative: 0.9 % (ref 0.0–3.0)
Eosinophils Absolute: 0.1 K/uL (ref 0.0–0.7)
Eosinophils Relative: 1.6 % (ref 0.0–5.0)
HCT: 31.9 % — ABNORMAL LOW (ref 39.0–52.0)
Hemoglobin: 10.7 g/dL — ABNORMAL LOW (ref 13.0–17.0)
Lymphocytes Relative: 20.2 % (ref 12.0–46.0)
Lymphs Abs: 0.9 K/uL (ref 0.7–4.0)
MCHC: 33.4 g/dL (ref 30.0–36.0)
MCV: 91.8 fl (ref 78.0–100.0)
Monocytes Absolute: 0.5 K/uL (ref 0.1–1.0)
Monocytes Relative: 11.7 % (ref 3.0–12.0)
Neutro Abs: 2.9 K/uL (ref 1.4–7.7)
Neutrophils Relative %: 65.6 % (ref 43.0–77.0)
Platelets: 381 K/uL (ref 150.0–400.0)
RBC: 3.48 Mil/uL — ABNORMAL LOW (ref 4.22–5.81)
RDW: 14 % (ref 11.5–15.5)
WBC: 4.4 K/uL (ref 4.0–10.5)

## 2023-12-18 LAB — COMPREHENSIVE METABOLIC PANEL WITH GFR
ALT: 13 U/L (ref 0–53)
AST: 18 U/L (ref 0–37)
Albumin: 3.4 g/dL — ABNORMAL LOW (ref 3.5–5.2)
Alkaline Phosphatase: 55 U/L (ref 39–117)
BUN: 10 mg/dL (ref 6–23)
CO2: 31 meq/L (ref 19–32)
Calcium: 8.6 mg/dL (ref 8.4–10.5)
Chloride: 103 meq/L (ref 96–112)
Creatinine, Ser: 0.9 mg/dL (ref 0.40–1.50)
GFR: 89.95 mL/min (ref >60.00)
Glucose, Bld: 109 mg/dL — ABNORMAL HIGH (ref 70–99)
Potassium: 3.8 meq/L (ref 3.5–5.1)
Sodium: 139 meq/L (ref 135–145)
Total Bilirubin: 0.3 mg/dL (ref 0.2–1.2)
Total Protein: 5.6 g/dL — ABNORMAL LOW (ref 6.0–8.3)

## 2023-12-18 LAB — IRON,TIBC AND FERRITIN PANEL
%SAT: 9 % — ABNORMAL LOW (ref 20–48)
Ferritin: 22 ng/mL — ABNORMAL LOW (ref 24–380)
Iron: 28 ug/dL — ABNORMAL LOW (ref 50–180)
TIBC: 317 ug/dL (ref 250–425)

## 2023-12-18 MED ORDER — HYDROCODONE-ACETAMINOPHEN 5-325 MG PO TABS
1.0000 | ORAL_TABLET | Freq: Four times a day (QID) | ORAL | 0 refills | Status: DC | PRN
  Filled 2023-12-18: qty 20, 5d supply, fill #0

## 2023-12-18 MED ORDER — DOCUSATE SODIUM 100 MG PO CAPS
100.0000 mg | ORAL_CAPSULE | Freq: Two times a day (BID) | ORAL | 0 refills | Status: AC
  Filled 2023-12-18: qty 20, 10d supply, fill #0

## 2023-12-18 MED ORDER — SUCRALFATE 1 GM/10ML PO SUSP
1.0000 g | Freq: Three times a day (TID) | ORAL | 0 refills | Status: DC
  Filled 2023-12-18 – 2023-12-20 (×2): qty 420, 11d supply, fill #0

## 2023-12-18 NOTE — Patient Instructions (Signed)
 Gastric ulcer and duodenal ulcer with acute blood loss anemia Recent upper GI bleed from  ulcers causing acute blood loss anemia. Treated with blood transfusion. On sucralfate  and Protonix . No current GI symptoms. Abstaining from alcohol. - Ensure follow-up with gastroenterologist for repeat endoscopy in November. - Order metabolic panel, blood count, and iron panel. - Check liver enzymes.  Right rib fractures Right rib fractures from fall due to anemia-related dizziness. On pain medication without constipation. Refilled norco pain med.  Possible multiple myeloma (suspected) CT neck showed suspicious areas for multiple myeloma. Needs further evaluation. - Refer to hematologist/oncologist for evaluation of possible multiple myeloma.  Parotid gland enlargement, right New asymmetry in right parotid gland area. Requires evaluation. - Refer to ENT for evaluation of parotid gland enlargement. - Point ENT to CT neck findings.  Depression and anxiety Started on paroxetine  10 mg. PHQ-9 score 6, anxiety score 2. Monitoring medication response. - Monitor response to paroxetine  10 mg. - Consider increasing dosage if necessary.  Alcohol use, currently abstinent Previously consumed alcohol daily. Currently abstinent. Advised to continue abstinence to prevent complications. - Advise continued abstinence from alcohol.  Follow up in 10 days or sooner if needed

## 2023-12-18 NOTE — Progress Notes (Signed)
 Subjective:    Patient ID: Nathaniel Williams, male    DOB: Dec 18, 1960, 63 y.o.   MRN: [id number]  HPI   Pt in for follow up from recent hospitalization.   Admit date: 12/09/2023 Discharge date: 12/12/2023   Admitted From: Home Disposition: Home   Recommendations for Outpatient Follow-up:  Follow up with PCP in 1-2 weeks Please obtain BMP/CBC in one week GI to schedule follow-up  Discharge summary: 63 year old history of hypertension, GERD, alcohol use disorder, previous history of GI bleeding and iron deficiency anemia followed by hematology, 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 Black tarry stool since last 5 days. In the emergency room hemodynamically stable. Hemoglobin 7.3, was 13.1 last year. Platelets adequate. Lipase normal. Electrolytes are adequate. Admitted with acute GI bleeding. Also found to have right 11th and 12th rib fracture due to fall.    Acute upper GI bleeding, anemia of acute blood loss, symptomatic anemia: Received total 2 units of PRBC with clinical improvement.  Hemoglobin remains stable after transfusion.  Upper GI endoscopy today 9/23, esophagogastric anastomosis ulceration circumferential scarring.  Nonbleeding cratered gastric ulcer with clean ulcer base in the prepyloric region of the stomach. Treated with IV PPI for 72 hours.  Clinically improved.  GI recommends discharging home with Protonix  40 mg twice daily to continue, alcohol abstinence, sucralfate  4 times daily.   GI to schedule outpatient follow-up for repeat endoscopy.   Biopsies are pending.   Essential hypertension: Untreated at home.  Blood pressures elevated in the hospital.  Previously on metoprolol , hydralazine , lisinopril .  Will start patient on metoprolol , prescription provided.  Recommended to follow-up with primary care physician.     Alcohol use disorder: Motivated to  quit.  Did not show any evidence of withdrawals.   Counseled.   Right posterior rib fractures: Incentive spirometry.  Continue supportive care.  Lidocaine  patch.  Short course of oxycodone  was prescribed because patient cannot take any NSAIDs.  He is on room air.   Stable to discharge with outpatient follow-up.   Nathaniel Williams is a 63 year old male with a history of GI bleed and stomach ulcer who presents for follow-up after hospitalization.  He was hospitalized for an upper GI bleed where a stomach ulcer was identified. During the hospitalization, he received two units of packed red blood cells due to symptomatic anemia from acute blood loss. He experienced dizziness and lightheadedness prior to the hospitalization, which led to falls on two consecutive days, resulting in right-sided rib fractures. He is currently on sucralfate  and Protonix  for the stomach ulcer. No current stomach pain, nausea, or vomiting. He has abstained from alcohol since before his discharge, having previously consumed three to four shots of alcohol daily.  He is experiencing some depression and anxiety, for which he has recently started taking paroxetine  10 mg. His PHQ-9 score is 6, indicating moderate depression, and his anxiety score is 2.  No constipation despite being on pain medication for his rib fractures.    Review of Systems  Constitutional:  Negative for chills, fatigue and fever.  Respiratory:  Negative for cough, chest tightness, shortness of breath and wheezing.   Cardiovascular:  Negative for chest pain and palpitations.  Gastrointestinal:  Negative for abdominal pain, constipation, diarrhea, nausea and vomiting.  Genitourinary:  Negative for dysuria.  Musculoskeletal:        Rib pain  Skin:  Negative for rash.  Neurological:  Negative for dizziness, syncope, weakness, numbness and headaches.  Hematological:  Negative for adenopathy.  Psychiatric/Behavioral:  Positive for dysphoric mood.  Negative for behavioral problems, decreased concentration, sleep disturbance and suicidal ideas. The patient is nervous/anxious.     Past Medical History:  Diagnosis Date   Achalasia 1996   Dx in [place] s/p Heller Myotomy w/gastric pull-thru   Alcohol use    Aspiration pneumonia (HCC)    Cervical spondylosis    Gastric ulcer    Gastritis    GI bleed    H/O hydrocele    of scrotum   Hypertension    IDA (iron deficiency anemia)      Social History   Socioeconomic History   Marital status: Legally Separated    Spouse name: Not on file   Number of children: Not on file   Years of education: Not on file   Highest education level: Not on file  Occupational History   Not on file  Tobacco Use   Smoking status: Former    Current packs/day: 0.25    Average packs/day: 0.3 packs/day for 18.0 years (4.5 ttl pk-yrs)    Types: Cigarettes   Smokeless tobacco: Never  Vaping Use   Vaping status: Never Used  Substance and Sexual Activity   Alcohol use: Not Currently    Comment: 1.5 L rum every 10 days (per hospital record 10/21/20)   Drug use: Never   Sexual activity: Not on file  Other Topics Concern   Not on file  Social History Narrative   Not on file   Social Drivers of Health   Financial Resource Strain: Not on file  Food Insecurity: No Food Insecurity (12/09/2023)   Hunger Vital Sign    Worried About Running Out of Food in the Last Year: Never true    Ran Out of Food in the Last Year: Never true  Transportation Needs: No Transportation Needs (12/09/2023)   PRAPARE - Administrator, Civil Service (Medical): No    Lack of Transportation (Non-Medical): No  Physical Activity: Not on file  Stress: Not on file  Social Connections: Unknown (08/02/2021)   Received from [facility]   Social Network    Social Network: Not on file  Intimate Partner Violence: Not At Risk (12/09/2023)   Humiliation, Afraid, Rape, and Kick questionnaire    Fear of Current or  Ex-Partner: No    Emotionally Abused: No    Physically Abused: No    Sexually Abused: No    Past Surgical History:  Procedure Laterality Date   BIOPSY  10/21/2020   Procedure: BIOPSY;  Surgeon: Albertus Gordy HERO, MD;  Location: [facility] ENDOSCOPY;  Service: Gastroenterology;;   ENTEROSCOPY N/A 12/11/2023   Procedure: ENTEROSCOPY;  Surgeon: Wilhelmenia Aloha Raddle., MD;  Location: [facility];  Service: Gastroenterology;  Laterality: N/A;   ESOPHAGOGASTRODUODENOSCOPY (EGD) WITH PROPOFOL  N/A 10/21/2020   Procedure: ESOPHAGOGASTRODUODENOSCOPY (EGD) WITH PROPOFOL ;  Surgeon: Albertus Gordy HERO, MD;  Location: [facility] ENDOSCOPY;  Service: Gastroenterology;  Laterality: N/A;   HELLER MYOTOMY  1996   in [place]; w/gastric pull thru   HEMOSTASIS CLIP PLACEMENT  10/21/2020   Procedure: HEMOSTASIS CLIP PLACEMENT;  Surgeon: Albertus Gordy HERO, MD;  Location: [facility] ENDOSCOPY;  Service: Gastroenterology;;    Family History  Problem Relation Age of Onset   Cervical cancer Mother    Alzheimer's disease Father    Stomach cancer Neg Hx    Esophageal cancer Neg Hx    Pancreatic cancer Neg Hx  No Known Allergies  Current Outpatient Medications on File Prior to Visit  Medication Sig Dispense Refill   lidocaine  (LIDODERM ) 5 % Place 1 patch onto the skin daily for 10 days. Remove & Discard patch within 12 hours or as directed by MD 10 patch 0   metoprolol  succinate (TOPROL -XL) 50 MG 24 hr tablet Take 1 tablet (50 mg total) by mouth daily. TAKE WITH OR IMMEDIATELY FOLLOWING A MEAL. 30 tablet 11   pantoprazole  (PROTONIX ) 40 MG tablet Take 1 tablet (40 mg total) by mouth 2 (two) times daily. 90 tablet 3   PARoxetine  (PAXIL ) 10 MG tablet Take 1 tablet (10 mg total) by mouth daily. 90 tablet 0   polyethylene glycol (MIRALAX  / GLYCOLAX ) 17 g packet Take 17 g by mouth daily. 14 each 0   No current facility-administered medications on file prior to visit.    BP 128/82   Pulse (!) 51   Temp 98.2 F (36.8 C) (Oral)   Resp 16    Ht 5' 7 (1.702 m)   Wt 125 lb 3.2 oz (56.8 kg)   SpO2 99%   BMI 19.61 kg/m        Objective:   Physical Exam  General Mental Status- Alert. General Appearance- Not in acute distress.   Skin General: Color- Normal Color. Moisture- Normal Moisture.  Neck Carotid Arteries- Normal color. Moisture- Normal Moisture. No carotid bruits. No JVD.  Chest and Lung Exam Auscultation: Breath Sounds:-Normal.  Cardiovascular Auscultation:Rythm- Regular. Murmurs & Other Heart Sounds:Auscultation of the heart reveals- No Murmurs.  Abdomen Inspection:-Inspeection Normal. Palpation/Percussion:Note:No mass. Palpation and Percussion of the abdomen reveal- Non Tender, Non Distended + BS, no rebound or guarding.   Neurologic Cranial Nerve exam:- CN III-XII intact(No nystagmus), symmetric smile. Strength:- 5/5 equal and symmetric strength both upper and lower extremities.   Heent- rt parotid gland area looks enlarged. But area is not tender to palpation.      Assessment & Plan:   Patient Instructions  Gastric ulcer and duodenal ulcer with acute blood loss anemia Recent upper GI bleed from  ulcers causing acute blood loss anemia. Treated with blood transfusion. On sucralfate  and Protonix . No current GI symptoms. Abstaining from alcohol. - Ensure follow-up with gastroenterologist for repeat endoscopy in November. - Order metabolic panel, blood count, and iron panel. - Check liver enzymes.  Right rib fractures Right rib fractures from fall due to anemia-related dizziness. On pain medication without constipation. Refilled norco pain med.  Possible multiple myeloma (suspected) CT neck showed suspicious areas for multiple myeloma. Needs further evaluation. - Refer to hematologist/oncologist for evaluation of possible multiple myeloma.  Parotid gland enlargement, right New asymmetry in right parotid gland area. Requires evaluation. - Refer to ENT for evaluation of parotid gland  enlargement. - Point ENT to CT neck findings.  Depression and anxiety Started on paroxetine  10 mg. PHQ-9 score 6, anxiety score 2. Monitoring medication response. - Monitor response to paroxetine  10 mg. - Consider increasing dosage if necessary.  Alcohol use, currently abstinent Previously consumed alcohol daily. Currently abstinent. Advised to continue abstinence to prevent complications. - Advise continued abstinence from alcohol.  Follow up in 10 days or sooner if needed   I personally spent a total of 45 minutes in the care of the patient today including getting/reviewing separately obtained history, performing a medically appropriate exam/evaluation, counseling and educating, placing orders, referring and communicating with other health care professionals, and documenting clinical information in the EHR.

## 2023-12-19 ENCOUNTER — Telehealth: Payer: Self-pay

## 2023-12-19 ENCOUNTER — Other Ambulatory Visit (HOSPITAL_BASED_OUTPATIENT_CLINIC_OR_DEPARTMENT_OTHER): Payer: Self-pay

## 2023-12-19 MED ORDER — IRON (FERROUS SULFATE) 325 (65 FE) MG PO TABS
325.0000 mg | ORAL_TABLET | Freq: Every day | ORAL | 1 refills | Status: DC
  Filled 2023-12-19: qty 30, 30d supply, fill #0
  Filled 2024-01-14: qty 30, 30d supply, fill #1

## 2023-12-19 NOTE — Telephone Encounter (Signed)
 Copied from CRM #[id number]. Topic: Clinical - Lab/Test Results >> Dec 19, 2023  8:36 AM Larissa RAMAN wrote: Reason for CRM: Patient called to obtain lab results. Relayed information per provider note. Patient verbalized understanding

## 2023-12-19 NOTE — Addendum Note (Signed)
 Addended by: DORINA DALLAS HERO on: 12/19/2023 08:18 AM   Modules accepted: Orders

## 2023-12-20 ENCOUNTER — Other Ambulatory Visit (HOSPITAL_BASED_OUTPATIENT_CLINIC_OR_DEPARTMENT_OTHER): Payer: Self-pay

## 2023-12-27 ENCOUNTER — Encounter (INDEPENDENT_AMBULATORY_CARE_PROVIDER_SITE_OTHER): Payer: Self-pay

## 2023-12-27 ENCOUNTER — Ambulatory Visit (INDEPENDENT_AMBULATORY_CARE_PROVIDER_SITE_OTHER)

## 2023-12-27 VITALS — BP 171/80 | HR 53 | Temp 98.8°F | Ht 67.0 in | Wt 124.0 lb

## 2023-12-27 DIAGNOSIS — K111 Hypertrophy of salivary gland: Secondary | ICD-10-CM

## 2023-12-27 DIAGNOSIS — R220 Localized swelling, mass and lump, head: Secondary | ICD-10-CM | POA: Diagnosis not present

## 2023-12-27 NOTE — Progress Notes (Signed)
 Dear Dr. Dorina, Here is my assessment for our mutual patient, Nathaniel Williams. Thank you for allowing me the opportunity to care for your patient. Please do not hesitate to contact me should you have any other questions. Sincerely, Dr. Hadassah Parody  Otolaryngology Clinic Note Referring provider: Dr. Dorina HPI:   Initial HPI (12/27/2023) Discussed the use of AI scribe software for clinical note transcription with the patient, who gave verbal consent to proceed. History of Present Illness Nathaniel Williams is a 63 year old male who presents with right facial swelling   Right-sided facial swelling - Right-sided neck swelling present with no change in size per patient however PCP has noticed increase in size - No associated pain - Feels like right side has always been more developed than left  - No recent dental infections - Occasionally grinds teeth at night - Has used a dental appliance in the past  Oropharyngeal and esophageal history - History of esophageal reconstruction surgery twice due to swallowing difficulties (once in [place], once in [place] ) - Denies any cancer related to esophageal condition  Tobacco use and dental habits - Quit smoking in 2004 - Denies current tobacco use, including chewing tobacco   Independent Review of Additional Tests or Records:  CT c spine (12/09/23): reviewed - unable to visualize parotid glands well enough due to lack of contrast   Referral note from [employer], PA-C (12/18/23) reviewed: pt having right parotid gland enlargement   PMH/Meds/All/SocHx/FamHx/ROS:   Past Medical History:  Diagnosis Date   Achalasia 1996   Dx in [place] s/p Heller Myotomy w/gastric pull-thru   Alcohol use    Aspiration pneumonia (HCC)    Cervical spondylosis    Gastric ulcer    Gastritis    GI bleed    H/O hydrocele    of scrotum   Hypertension    IDA (iron deficiency anemia)      Past Surgical History:  Procedure  Laterality Date   BIOPSY  10/21/2020   Procedure: BIOPSY;  Surgeon: Albertus Gordy HERO, MD;  Location: [facility] ENDOSCOPY;  Service: Gastroenterology;;   ENTEROSCOPY N/A 12/11/2023   Procedure: ENTEROSCOPY;  Surgeon: Wilhelmenia Aloha Raddle., MD;  Location: [facility] ENDOSCOPY;  Service: Gastroenterology;  Laterality: N/A;   ESOPHAGOGASTRODUODENOSCOPY (EGD) WITH PROPOFOL  N/A 10/21/2020   Procedure: ESOPHAGOGASTRODUODENOSCOPY (EGD) WITH PROPOFOL ;  Surgeon: Albertus Gordy HERO, MD;  Location: [facility] ENDOSCOPY;  Service: Gastroenterology;  Laterality: N/A;   HELLER MYOTOMY  1996   in [place]; w/gastric pull thru   HEMOSTASIS CLIP PLACEMENT  10/21/2020   Procedure: HEMOSTASIS CLIP PLACEMENT;  Surgeon: Albertus Gordy HERO, MD;  Location: [facility] ENDOSCOPY;  Service: Gastroenterology;;    Family History  Problem Relation Age of Onset   Cervical cancer Mother    Alzheimer's disease Father    Stomach cancer Neg Hx    Esophageal cancer Neg Hx    Pancreatic cancer Neg Hx      Social Connections: Unknown (08/02/2021)   Received from [facility]   Social Network    Social Network: Not on file     Current Outpatient Medications  Medication Instructions   docusate sodium  (COLACE) 100 mg, Oral, 2 times daily   HYDROcodone -acetaminophen  (NORCO/VICODIN) 5-325 MG tablet 1 tablet, Oral, Every 6 hours PRN   Iron, Ferrous Sulfate, 325 (65 Fe) MG TABS Take 1 tablet (325 mg) by mouth daily.   metoprolol  succinate (TOPROL -XL) 50 mg, Oral, Daily, TAKE WITH OR IMMEDIATELY FOLLOWING A MEAL.   pantoprazole  (PROTONIX ) 40  mg, Oral, 2 times daily   PARoxetine  (PAXIL ) 10 mg, Oral, Daily   polyethylene glycol (MIRALAX  / GLYCOLAX ) 17 g, Oral, Daily   sucralfate  (CARAFATE ) 1 g, Oral, 3 times daily with meals & bedtime     Physical Exam:   BP (!) 171/80   Pulse (!) 53   Temp 98.8 F (37.1 C)   Ht 5' 7 (1.702 m)   Wt 124 lb (56.2 kg)   SpO2 91%   BMI 19.42 kg/m   Salient findings:  CN II-XII intact Right facial swelling at the insertion  of masseter on the mandible. No masses felt. Not tender Intraoral exam reveals carious changes of dentition and some missing dentition. No pain or tenderness or signs of infection. No lesions.  No obviously palpable neck masses/lymphadenopathy/thyromegaly No respiratory distress or stridor  Seprately Identifiable Procedures:  Prior to initiating any procedures, risks/benefits/alternatives were explained to the patient and verbal consent obtained.   Impression & Plans:  Nathaniel Williams is a 63 y.o. male with  Assessment and Plan Assessment & Plan Right facial swelling - possible masseter hypertrophy but given that swelling is new to PCP (pt unsure if new) would proceed with ultrasound to ensure no cysts or masses in the area. If US  is negative likely can continue observation - He will call is new pain or other issues - US  and follow-up   1. Right facial swelling   2. Parotid gland enlargement      See below regarding exact medications prescribed this encounter including dosages and route: No orders of the defined types were placed in this encounter.   Thank you for allowing me the opportunity to care for your patient. Please do not hesitate to contact me should you have any other questions.  Sincerely, Hadassah Parody, MD Otolaryngologist (ENT), [facility] Phone: [phone] Fax: [phone]

## 2023-12-28 ENCOUNTER — Other Ambulatory Visit (HOSPITAL_BASED_OUTPATIENT_CLINIC_OR_DEPARTMENT_OTHER): Payer: Self-pay

## 2023-12-28 ENCOUNTER — Ambulatory Visit (INDEPENDENT_AMBULATORY_CARE_PROVIDER_SITE_OTHER): Admitting: Medical

## 2023-12-28 VITALS — BP 180/100 | HR 55 | Temp 97.9°F | Resp 16 | Ht 67.0 in | Wt 117.6 lb

## 2023-12-28 DIAGNOSIS — D649 Anemia, unspecified: Secondary | ICD-10-CM | POA: Diagnosis not present

## 2023-12-28 DIAGNOSIS — F419 Anxiety disorder, unspecified: Secondary | ICD-10-CM | POA: Diagnosis not present

## 2023-12-28 DIAGNOSIS — F1090 Alcohol use, unspecified, uncomplicated: Secondary | ICD-10-CM

## 2023-12-28 DIAGNOSIS — K111 Hypertrophy of salivary gland: Secondary | ICD-10-CM | POA: Diagnosis not present

## 2023-12-28 DIAGNOSIS — R0789 Other chest pain: Secondary | ICD-10-CM

## 2023-12-28 DIAGNOSIS — F32A Depression, unspecified: Secondary | ICD-10-CM

## 2023-12-28 MED ORDER — HYDROCODONE-ACETAMINOPHEN 5-325 MG PO TABS
1.0000 | ORAL_TABLET | Freq: Four times a day (QID) | ORAL | 0 refills | Status: DC | PRN
  Filled 2023-12-28: qty 20, 5d supply, fill #0

## 2023-12-28 MED ORDER — SUCRALFATE 1 GM/10ML PO SUSP
1.0000 g | Freq: Three times a day (TID) | ORAL | 0 refills | Status: AC
  Filled 2024-01-11 – 2024-01-14 (×2): qty 420, 11d supply, fill #0

## 2023-12-28 NOTE — Progress Notes (Signed)
 Subjective:    Patient ID: Nathaniel Williams, male    DOB: Jun 27, 1960, 63 y.o.   MRN: [id number]  HPI  Last visit AVS below.  Gastric ulcer and duodenal ulcer with acute blood loss anemia Recent upper GI bleed from  ulcers causing acute blood loss anemia. Treated with blood transfusion. On sucralfate  and Protonix . No current GI symptoms. Abstaining from alcohol. - Ensure follow-up with gastroenterologist for repeat endoscopy in November. - Order metabolic panel, blood count, and iron panel. - Check liver enzymes.   Right rib fractures Right rib fractures from fall due to anemia-related dizziness. On pain medication without constipation. Refilled norco pain med.   Possible multiple myeloma (suspected) CT neck showed suspicious areas for multiple myeloma. Needs further evaluation. - Refer to hematologist/oncologist for evaluation of possible multiple myeloma.   Parotid gland enlargement, right New asymmetry in right parotid gland area. Requires evaluation. - Refer to ENT for evaluation of parotid gland enlargement. - Point ENT to CT neck findings.   Depression and anxiety Started on paroxetine  10 mg. PHQ-9 score 6, anxiety score 2. Monitoring medication response. - Monitor response to paroxetine  10 mg. - Consider increasing dosage if necessary.   Alcohol use, currently abstinent Previously consumed alcohol daily. Currently abstinent. Advised to continue abstinence to prevent complications. - Advise continued abstinence from alcohol.   Nathaniel Williams is a 63 year old male with rib fractures due to a fall who presents for follow-up on pain management.  He has persistent pain in the right ribs following a fall approximately three weeks ago, attributed to dizziness secondary to anemia from a gastric ulcer bleed. The rib pain is exacerbated by coughing, deep breathing, and twisting movements. He is taking hydrocodone , with 20 tablets last prescribed on September  30, using them every six to eight hours, averaging three tablets a day.  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 He wishes to continue this arrangement for another three to four weeks to avoid stocking duties, which could exacerbate his condition.  He has a history of anemia related to a gastric ulcer bleed and is under the care of a hematologist and GI MD. Blood was drawn recently, and he is scheduled for another draw next Friday to determine the need for a blood transfusion.   He is awaiting further evaluation and reports from the hematologist.  He is scheduled to see a gastroenterologist in November for follow-up on his gastric ulcer and anemia. He continues to take pantoprazole  40 mg daily.  He has a history of depression and anxiety, for which he is taking paroxetine  10 mg daily, and he feels it is helping his mood.  He has a history of alcohol use disorder but is currently abstaining from alcohol.   Review of Systems See  hpi    Objective:   Physical Exam  General Mental Status- Alert. General Appearance- Not in acute distress.    Skin General: Color- Normal Color. Moisture- Normal Moisture.   Neck Carotid Arteries- Normal color. Moisture- Normal Moisture. No carotid bruits. No JVD.   Chest and Lung Exam Auscultation: Breath Sounds:-Normal.   Cardiovascular Auscultation:Rythm- Regular. Murmurs & Other Heart Sounds:Auscultation of the heart reveals- No Murmurs.   Abdomen Inspection:-Inspeection Normal. Palpation/Percussion:Note:No mass. Palpation and Percussion of the abdomen reveal- Non Tender, Non Distended + BS, no rebound or guarding.     Neurologic Cranial Nerve exam:- CN III-XII intact(No nystagmus), symmetric smile. Strength:- 5/5 equal  and symmetric strength both upper and lower extremities.    Heent- rt parotid gland area looks enlarged. But area is not tender to palpation.      Assessment & Plan:   Patient  Instructions  Right rib fractures Persistent pain three weeks post-injury, exacerbated by certain movements. Requires light duty at work. - Prescribed Norco 20 tablets 1 tab po every 6-8 hours prn pain - Advised over-the-counter lidocaine  patches for pain relief. - Provided work note for light duty for 3-4 weeks.  Anemia secondary to gastric ulcer bleed Anemia due to gastric ulcer bleed. Possible multiple myeloma suggested by imaging. of neck - Follow up with hematology for blood test results and potential transfusion. Go thru work up for the referral. - Continue pantoprazole  40 mg daily. - Follow up with gastroenterologist in November. For endoscopy.  Gastric ulcer -Continue protonix  and carafate  -- Follow up with gastroenterologist in November. For endoscopy.  Right parotid gland enlargement Asymmetry noted in right parotid gland. - Undergo ultrasound of the parotid gland. Followed by ENT.  Depression and anxiety Managed with Paxil  10 mg, Glad to hear you are doing better  Alcohol use disorder -Abstaining from alcohol, Glad to hear the news.  Follow up in one month or sooner if needed    [employer], PA-C

## 2023-12-28 NOTE — Patient Instructions (Signed)
 Right rib fractures Persistent pain three weeks post-injury, exacerbated by certain movements. Requires light duty at work. - Prescribed Norco 20 tablets 1 tab po every 6-8 hours prn pain - Advised over-the-counter lidocaine  patches for pain relief. - Provided work note for light duty for 3-4 weeks.  Anemia secondary to gastric ulcer bleed Anemia due to gastric ulcer bleed. Possible multiple myeloma suggested by imaging. of neck - Follow up with hematology for blood test results and potential transfusion. Go thru work up for the referral. - Continue pantoprazole  40 mg daily. - Follow up with gastroenterologist in November. For endoscopy.  Gastric ulcer -Continue protonix  and carafate  -- Follow up with gastroenterologist in November. For endoscopy.  Right parotid gland enlargement Asymmetry noted in right parotid gland. - Undergo ultrasound of the parotid gland. Followed by ENT.  Depression and anxiety Managed with Paxil  10 mg, Glad to hear you are doing better  Alcohol use disorder -Abstaining from alcohol, Glad to hear the news.  Follow up in one month or sooner if needed

## 2023-12-29 ENCOUNTER — Ambulatory Visit (HOSPITAL_BASED_OUTPATIENT_CLINIC_OR_DEPARTMENT_OTHER)
Admission: RE | Admit: 2023-12-29 | Discharge: 2023-12-29 | Disposition: A | Discharge status: Home / Self Care | Source: Ambulatory Visit

## 2023-12-29 DIAGNOSIS — K111 Hypertrophy of salivary gland: Secondary | ICD-10-CM | POA: Diagnosis present

## 2024-01-07 ENCOUNTER — Other Ambulatory Visit (HOSPITAL_BASED_OUTPATIENT_CLINIC_OR_DEPARTMENT_OTHER): Payer: Self-pay

## 2024-01-07 ENCOUNTER — Other Ambulatory Visit: Payer: Self-pay | Admitting: Medical

## 2024-01-07 NOTE — Telephone Encounter (Unsigned)
 Copied from CRM #[id number]. Topic: Clinical - Medication Refill >> Jan 07, 2024  1:43 PM Leah W wrote: Medication: HYDROcodone -acetaminophen  (NORCO/VICODIN) 5-325 MG tablet  Has the patient contacted their pharmacy? No (Agent: If no, request that the patient contact the pharmacy for the refill. If patient does not wish to contact the pharmacy document the reason why and proceed with request.) (Agent: If yes, when and what did the pharmacy advise?)  This is the patient's preferred pharmacy:  [facility] [address] [place] [address] Phone: [phone] Fax: [phone]  Is this the correct pharmacy for this prescription? Yes If no, delete pharmacy and type the correct one.   Has the prescription been filled recently? No  Is the patient out of the medication? Yes  Has the patient been seen for an appointment in the last year OR does the patient have an upcoming appointment? Yes  Can we respond through MyChart? No  Agent: Please be advised that Rx refills may take up to 3 business days. We ask that you follow-up with your pharmacy.

## 2024-01-08 NOTE — Telephone Encounter (Signed)
 Requesting: hydrocodone  5-325mg   Contract: 03/27/22 UDS:03/27/22 Last Visit: 12/28/23 Next Visit:01/25/24 Last Refill: 12/28/23 #20 and 0RF   Please Advise

## 2024-01-11 ENCOUNTER — Other Ambulatory Visit (HOSPITAL_BASED_OUTPATIENT_CLINIC_OR_DEPARTMENT_OTHER): Payer: Self-pay

## 2024-01-11 MED ORDER — HYDROCODONE-ACETAMINOPHEN 5-325 MG PO TABS
1.0000 | ORAL_TABLET | Freq: Four times a day (QID) | ORAL | 0 refills | Status: DC | PRN
  Filled 2024-01-11: qty 20, 5d supply, fill #0

## 2024-01-11 NOTE — Telephone Encounter (Signed)
Rx refill sent to pt pharmacy 

## 2024-01-14 ENCOUNTER — Other Ambulatory Visit (HOSPITAL_BASED_OUTPATIENT_CLINIC_OR_DEPARTMENT_OTHER): Payer: Self-pay

## 2024-01-14 ENCOUNTER — Ambulatory Visit

## 2024-01-15 ENCOUNTER — Other Ambulatory Visit (HOSPITAL_BASED_OUTPATIENT_CLINIC_OR_DEPARTMENT_OTHER): Payer: Self-pay

## 2024-01-17 ENCOUNTER — Encounter (INDEPENDENT_AMBULATORY_CARE_PROVIDER_SITE_OTHER): Payer: Self-pay

## 2024-01-17 ENCOUNTER — Ambulatory Visit (INDEPENDENT_AMBULATORY_CARE_PROVIDER_SITE_OTHER)

## 2024-01-17 ENCOUNTER — Other Ambulatory Visit (HOSPITAL_BASED_OUTPATIENT_CLINIC_OR_DEPARTMENT_OTHER): Payer: Self-pay

## 2024-01-17 VITALS — BP 145/86 | HR 56 | Ht 67.0 in | Wt 117.0 lb

## 2024-01-17 DIAGNOSIS — R220 Localized swelling, mass and lump, head: Secondary | ICD-10-CM

## 2024-01-20 ENCOUNTER — Encounter (INDEPENDENT_AMBULATORY_CARE_PROVIDER_SITE_OTHER): Payer: Self-pay

## 2024-01-20 NOTE — Progress Notes (Signed)
 Dear Dr. Dorina, Here is my assessment for our mutual patient, Nathaniel Williams. Thank you for allowing me the opportunity to care for your patient. Please do not hesitate to contact me should you have any other questions. Sincerely, Dr. Hadassah Parody  Otolaryngology Clinic Note Referring provider: Dr. Dorina HPI:   Initial HPI (12/27/23) Discussed the use of AI scribe software for clinical note transcription with the patient, who gave verbal consent to proceed.  Nathaniel Williams is a 63 year old male who presents with right facial swelling   Right-sided facial swelling - Right-sided neck swelling present with no change in size per patient however PCP has noticed increase in size - No associated pain - Feels like right side has always been more developed than left  - No recent dental infections - Occasionally grinds teeth at night - Has used a dental appliance in the past  Oropharyngeal and esophageal history - History of esophageal reconstruction surgery twice due to swallowing difficulties (once in [place], once in [place] ) - Denies any cancer related to esophageal condition  Tobacco use and dental habits - Quit smoking in 2004 - Denies current tobacco use, including chewing tobacco History of Present Illness --------------------------------------------------------- 01/17/2024  Presents for f/u with US . Pt reports no changes.    Independent Review of Additional Tests or Records:  CT c spine (12/09/23): reviewed - unable to visualize parotid glands well enough due to lack of contrast   Referral note from [facility], PA-C (12/18/23) reviewed: pt having right parotid gland enlargement   US  neck 12/30/23 independently reviewed and no evidence of mass or nodule noted.   PMH/Meds/All/SocHx/FamHx/ROS:   Past Medical History:  Diagnosis Date   Achalasia 1996   Dx in [place] s/p Heller Myotomy w/gastric pull-thru   Alcohol use    Aspiration  pneumonia (HCC)    Cervical spondylosis    Gastric ulcer    Gastritis    GI bleed    H/O hydrocele    of scrotum   Hypertension    IDA (iron deficiency anemia)      Past Surgical History:  Procedure Laterality Date   BIOPSY  10/21/2020   Procedure: BIOPSY;  Surgeon: Albertus Gordy HERO, MD;  Location[facility] ENDOSCOPY;  Service: Gastroenterology;;   ENTEROSCOPY N/A 12/11/2023   Procedure: ENTEROSCOPY;  Surgeon: Wilhelmenia Aloha Raddle., MD;  Location: [facility] ENDOSCOPY;  Service: Gastroenterology;  Laterality: N/A;   ESOPHAGOGASTRODUODENOSCOPY (EGD) WITH PROPOFOL  N/A 10/21/2020   Procedure: ESOPHAGOGASTRODUODENOSCOPY (EGD) WITH PROPOFOL ;  Surgeon: Albertus Gordy HERO, MD;  Location: [facility] ENDOSCOPY;  Service: Gastroenterology;  Laterality: N/A;   HELLER MYOTOMY  1996   in [place]; w/gastric pull thru   HEMOSTASIS CLIP PLACEMENT  10/21/2020   Procedure: HEMOSTASIS CLIP PLACEMENT;  Surgeon: Albertus Gordy HERO, MD;  Location: [facility] ENDOSCOPY;  Service: Gastroenterology;;    Family History  Problem Relation Age of Onset   Cervical cancer Mother    Alzheimer's disease Father    Stomach cancer Neg Hx    Esophageal cancer Neg Hx    Pancreatic cancer Neg Hx      Social Connections: Unknown (08/02/2021)   Received from [facility]   Social Network    Social Network: Not on file     Current Outpatient Medications  Medication Instructions   docusate sodium  (COLACE) 100 mg, Oral, 2 times daily   HYDROcodone -acetaminophen  (NORCO/VICODIN) 5-325 MG tablet 1 tablet, Oral, Every 6 hours PRN   Iron, Ferrous Sulfate, 325 (65 Fe) MG TABS  Take 1 tablet (325 mg) by mouth daily.   metoprolol  succinate (TOPROL -XL) 50 mg, Oral, Daily, TAKE WITH OR IMMEDIATELY FOLLOWING A MEAL.   pantoprazole  (PROTONIX ) 40 mg, Oral, 2 times daily   PARoxetine  (PAXIL ) 10 mg, Oral, Daily   polyethylene glycol (MIRALAX  / GLYCOLAX ) 17 g, Oral, Daily   sucralfate  (CARAFATE ) 1 g, Oral, 3 times daily with meals & bedtime     Physical Exam:   BP  (!) 145/86   Pulse (!) 56   Ht 5' 7 (1.702 m)   Wt 117 lb (53.1 kg)   SpO2 96%   BMI 18.32 kg/m   Salient findings:  CN II-XII intact Right facial swelling at the insertion of masseter on the mandible. No masses felt. Not tender. No changes in exam from prior.  Intraoral exam reveals carious changes of dentition and some missing dentition. No pain or tenderness or signs of infection. No lesions.  No obviously palpable neck masses/lymphadenopathy/thyromegaly No respiratory distress or stridor  Seprately Identifiable Procedures:  Prior to initiating any procedures, risks/benefits/alternatives were explained to the patient and verbal consent obtained.   Impression & Plans:  Nathaniel Williams is a 63 y.o. male with  Assessment and Plan Assessment & Plan  Right facial swelling Likely benign. Ultrasound normal. Asymmetry possibly due to masseter hypertrophy on that side - No follow-up needed. Call if issues or concerns.   No diagnosis found.   See below regarding exact medications prescribed this encounter including dosages and route: No orders of the defined types were placed in this encounter.   Thank you for allowing me the opportunity to care for your patient. Please do not hesitate to contact me should you have any other questions.  Sincerely, Hadassah Parody, MD Otolaryngologist (ENT), [facility] Phone: [phone] Fax: [phone]

## 2024-01-24 ENCOUNTER — Ambulatory Visit (AMBULATORY_SURGERY_CENTER)

## 2024-01-24 VITALS — Ht 67.0 in | Wt 117.0 lb

## 2024-01-24 DIAGNOSIS — K257 Chronic gastric ulcer without hemorrhage or perforation: Secondary | ICD-10-CM

## 2024-01-24 NOTE — Progress Notes (Signed)

## 2024-01-25 ENCOUNTER — Other Ambulatory Visit (HOSPITAL_BASED_OUTPATIENT_CLINIC_OR_DEPARTMENT_OTHER): Payer: Self-pay

## 2024-01-25 ENCOUNTER — Ambulatory Visit (INDEPENDENT_AMBULATORY_CARE_PROVIDER_SITE_OTHER): Admitting: Medical

## 2024-01-25 ENCOUNTER — Encounter: Payer: Self-pay | Admitting: Medical

## 2024-01-25 VITALS — BP 130/64 | HR 64 | Ht 67.0 in | Wt 114.2 lb

## 2024-01-25 DIAGNOSIS — S2241XS Multiple fractures of ribs, right side, sequela: Secondary | ICD-10-CM

## 2024-01-25 MED ORDER — HYDROCODONE-ACETAMINOPHEN 5-325 MG PO TABS
1.0000 | ORAL_TABLET | Freq: Four times a day (QID) | ORAL | 0 refills | Status: DC | PRN
  Filled 2024-01-25: qty 20, 5d supply, fill #0

## 2024-01-25 NOTE — Progress Notes (Signed)
   Subjective:    Patient ID: Nathaniel Williams, male    DOB: 01/06/1961, 63 y.o.   MRN: [id number]  HPI  male who presents with rib and lower back pain.  He has been experiencing pain in his right side ribs for 6 weeks and lower back since moving furnniture just recently. He has a history of right side eleventh and twelfth rib fractures, which occurred on December 09, 2023. The pain had been decreasing over time but was exacerbated after moving furniture last week. No new falls or injuries to the area are reported.  He is scheduled for an MRI of his neck next week due to a history of neck pain. Additionally, he mentions an upcoming EGD to evaluate his stomach due to a history of gastritis, heartburn, and a previous ulcer.  For his rib, he has been receiving prescriptions for hydrocodone , with the last prescription written on January 11, 2024. He has been using the medication sporadically and reports running out of it after the recent flare-up of pain. No constipation from the medication, noting that he has Miralax  at home if needed.  Regarding his lower back pain, it does not radiate down his leg and there are no spasms. The pain is aggravated by activities such as moving and walking up stairs, particularly at work where he has to navigate to the third floor. He continues to work despite the pain.   Review of Systems See hpi    Objective:   Physical Exam  General- No acute distress. Pleasant patient. Neck- Full range of motion, no jvd Lungs- Clear, even and unlabored. Heart- regular rate and rhythm. Neurologic- CNII- XII grossly intact.  Rt ribs- mild tender to palption.    Back NO Mid lumbar spine tenderness to palpation. Pain on straight leg lift bilaterally Pain on lateral movements and flexion/extension of the spine.   Lower ext neurologic  L5-S1 sensation intact bilaterally. Normal patellar reflexes bilaterally. No foot drop bilaterally.       Assessment &  Plan:   Right rib fractures, healing Healing right 11th and 12th rib fractures with pain exacerbated by recent activity. Expected resolution in two weeks. - Prescribed hydrocodone  for pain management. - Advised to avoid NSAIDs due to gastritis and peptic ulcer history. - Continue light duty until next evaluation in early December.  Lumbar back pain Pain exacerbated by recent physical activity, localized to lower back. - Prescribed hydrocodone  for pain management. - Advised to avoid heavy lifting. - Continue light duty until next evaluation in early December.  Neck pain, history of History of neck pain with MRI scheduled for evaluation. - Proceed with scheduled MRI for neck evaluation.  Gastritis and history of peptic ulcer Gastritis and peptic ulcer with EGD scheduled for evaluation. - Proceed with scheduled EGD for gastritis and peptic ulcer evaluation later this month - Avoid NSAIDs due to gastritis and peptic ulcer history.  Follow up early december to determine if can lift light duty restrictions

## 2024-01-25 NOTE — Patient Instructions (Signed)
 Right rib fractures, healing Healing right 11th and 12th rib fractures with pain exacerbated by recent activity. Expected resolution in two weeks. - Prescribed hydrocodone  for pain management. - Advised to avoid NSAIDs due to gastritis and peptic ulcer history. - Continue light duty until next evaluation in early December.  Lumbar back pain Pain exacerbated by recent physical activity, localized to lower back. - Prescribed hydrocodone  for pain management. - Advised to avoid heavy lifting. - Continue light duty until next evaluation in early December.  Neck pain, history of History of neck pain with MRI scheduled for evaluation. - Proceed with scheduled MRI for neck evaluation.  Gastritis and history of peptic ulcer Gastritis and peptic ulcer with EGD scheduled for evaluation. - Proceed with scheduled EGD for gastritis and peptic ulcer evaluation later this month - Avoid NSAIDs due to gastritis and peptic ulcer history.  Follow up early december to determine if can lift light duty restrictions

## 2024-02-06 ENCOUNTER — Other Ambulatory Visit (HOSPITAL_BASED_OUTPATIENT_CLINIC_OR_DEPARTMENT_OTHER): Payer: Self-pay

## 2024-02-06 ENCOUNTER — Other Ambulatory Visit: Payer: Self-pay

## 2024-02-06 ENCOUNTER — Ambulatory Visit: Admitting: Internal Medicine

## 2024-02-06 ENCOUNTER — Encounter: Payer: Self-pay | Admitting: Internal Medicine

## 2024-02-06 VITALS — BP 133/84 | HR 67 | Temp 97.5°F | Resp 17 | Ht 67.0 in | Wt 117.0 lb

## 2024-02-06 DIAGNOSIS — K269 Duodenal ulcer, unspecified as acute or chronic, without hemorrhage or perforation: Secondary | ICD-10-CM

## 2024-02-06 DIAGNOSIS — K263 Acute duodenal ulcer without hemorrhage or perforation: Secondary | ICD-10-CM | POA: Diagnosis not present

## 2024-02-06 DIAGNOSIS — K253 Acute gastric ulcer without hemorrhage or perforation: Secondary | ICD-10-CM

## 2024-02-06 DIAGNOSIS — Z8711 Personal history of peptic ulcer disease: Secondary | ICD-10-CM

## 2024-02-06 DIAGNOSIS — Z980 Intestinal bypass and anastomosis status: Secondary | ICD-10-CM | POA: Diagnosis not present

## 2024-02-06 MED ORDER — PANTOPRAZOLE SODIUM 40 MG PO TBEC
40.0000 mg | DELAYED_RELEASE_TABLET | Freq: Every day | ORAL | 3 refills | Status: AC
  Filled 2024-02-06 (×2): qty 90, 90d supply, fill #0

## 2024-02-06 MED ORDER — SODIUM CHLORIDE 0.9 % IV SOLN: 500.0000 mL | INTRAVENOUS | Status: DC

## 2024-02-06 NOTE — Op Note (Signed)
 [facility] Patient Name: Nathaniel Williams Procedure Date: 02/06/2024 11:28 AM MRN: [id number] Endoscopist: Gordy CHRISTELLA Starch , MD, [id number] Age: 63 Referring MD:  Date of Birth: Oct 17, 1960 Gender: Male Account #: [id number] Procedure:                Upper GI endoscopy Indications:              Follow-up of acute gastric ulcer, Follow-up of                            acute duodenal ulcer; found at EGD in September                            2025. Biopsies negative for H. Pylori at that time.                            Twice daily pantoprazole  since that time. Patient                            denies dysphagia symptoms today, resolved                            epigastric pain. Medicines:                Monitored Anesthesia Care Procedure:                Pre-Anesthesia Assessment:                           - Prior to the procedure, a History and Physical                            was performed, and patient medications and                            allergies were reviewed. The patient's tolerance of                            previous anesthesia was also reviewed. The risks                            and benefits of the procedure and the sedation                            options and risks were discussed with the patient.                            All questions were answered, and informed consent                            was obtained. Prior Anticoagulants: The patient has                            taken no anticoagulant or antiplatelet agents. ASA  Grade Assessment: II - A patient with mild systemic                            disease. After reviewing the risks and benefits,                            the patient was deemed in satisfactory condition to                            undergo the procedure.                           After obtaining informed consent, the endoscope was                            passed under direct vision. Throughout the                             procedure, the patient's blood pressure, pulse, and                            oxygen saturations were monitored continuously. The                            GIF [id number] #[id number] was introduced through the                            mouth, and advanced to the third part of duodenum.                            The upper GI endoscopy was accomplished without                            difficulty. The patient tolerated the procedure                            well. Scope In: Scope Out: Findings:                 An esophago-gastric anastomosis was found in the                            upper third of the esophagus at 23 cm from                            incisors. There is non-obstructing narrowing and                            given lack of dysphagia symptom dilation was not                            performed today.                           The entire examined stomach was normal.  The examined duodenum was normal. Complications:            No immediate complications. Estimated Blood Loss:     Estimated blood loss: none. Impression:               - An esophago-gastric anastomosis was found.                           - Normal stomach. The previously seen stomach ulcer                            has healed.                           - Normal examined duodenum. The previously seen                            duodenal ulcer has healed.                           - No specimens collected. Recommendation:           - Patient has a contact number available for                            emergencies. The signs and symptoms of potential                            delayed complications were discussed with the                            patient. Return to normal activities tomorrow.                            Written discharge instructions were provided to the                            patient.                           - Resume previous diet.                            - Continue present medications. Can reduce                            pantoprazole  to 40 mg once daily. Gordy CHRISTELLA Starch, MD 02/06/2024 11:42:57 AM This report has been signed electronically.

## 2024-02-06 NOTE — Patient Instructions (Signed)
 -  Resume previous diet. - Continue present medications.  YOU HAD AN ENDOSCOPIC PROCEDURE TODAY AT THE [facility]:   Refer to the procedure report that was given to you for any specific questions about what was found during the examination.  If the procedure report does not answer your questions, please call your gastroenterologist to clarify.  If you requested that your care partner not be given the details of your procedure findings, then the procedure report has been included in a sealed envelope for you to review at your convenience later.  YOU SHOULD EXPECT: Some feelings of bloating in the abdomen. Passage of more gas than usual.  Walking can help get rid of the air that was put into your GI tract during the procedure and reduce the bloating. If you had a lower endoscopy (such as a colonoscopy or flexible sigmoidoscopy) you may notice spotting of blood in your stool or on the toilet paper. If you underwent a bowel prep for your procedure, you may not have a normal bowel movement for a few days.  Please Note:  You might notice some irritation and congestion in your nose or some drainage.  This is from the oxygen used during your procedure.  There is no need for concern and it should clear up in a day or so.  SYMPTOMS TO REPORT IMMEDIATELY:    Following upper endoscopy (EGD)  Vomiting of blood or coffee ground material  New chest pain or pain under the shoulder blades  Painful or persistently difficult swallowing  New shortness of breath  Fever of 100F or higher  Black, tarry-looking stools  For urgent or emergent issues, a gastroenterologist can be reached at any hour by calling [phone]. Do not use MyChart messaging for urgent concerns.    DIET:  We do recommend a small meal at first, but then you may proceed to your regular diet.  Drink plenty of fluids but you should avoid alcoholic beverages for 24 hours.  ACTIVITY:  You should plan to take it easy for the rest  of today and you should NOT DRIVE or use heavy machinery until tomorrow (because of the sedation medicines used during the test).    FOLLOW UP: Our staff will call the number listed on your records the next business day following your procedure.  We will call around 7:15- 8:00 am to check on you and address any questions or concerns that you may have regarding the information given to you following your procedure. If we do not reach you, we will leave a message.     If any biopsies were taken you will be contacted by phone or by letter within the next 1-3 weeks.  Please call us at [phone] if you have not heard about the biopsies in 3 weeks.    SIGNATURES/CONFIDENTIALITY: You and/or your care partner have signed paperwork which will be entered into your electronic medical record.  These signatures attest to the fact that that the information above on your After Visit Summary has been reviewed and is understood.  Full responsibility of the confidentiality of this discharge information lies with you and/or your care-partner.

## 2024-02-06 NOTE — Progress Notes (Signed)
 Pt's states no medical or surgical changes since previsit or office visit.

## 2024-02-06 NOTE — Progress Notes (Signed)
 Sedate, gd SR, tolerated procedure well, VSS, report to RN

## 2024-02-06 NOTE — Progress Notes (Signed)
 GASTROENTEROLOGY PROCEDURE H&P NOTE   Primary Care Physician: Dorina Loving, PA-C    Reason for Procedure:  Follow-up gastric ulcers and duodenal ulcer  Plan:    EGD with possible dilation  Patient is appropriate for endoscopic procedure(s) in the ambulatory ([facility]) setting.  The nature of the procedure, as well as the risks, benefits, and alternatives were carefully and thoroughly reviewed with the patient. Ample time for discussion and questions allowed.  All questions were answered. The patient understood, was satisfied, and agreed with the plan to proceed.    HPI: Nathaniel Williams is a 63 y.o. male who presents for EGD.  Medical history as below.   No recent chest pain or shortness of breath.  No abdominal pain today.  Past Medical History:  Diagnosis Date   Achalasia 1996   Dx in [place] s/p Heller Myotomy w/gastric pull-thru   Alcohol use    Aspiration pneumonia (HCC)    Cervical spondylosis    Gastric ulcer    Gastritis    GERD (gastroesophageal reflux disease)    GI bleed    H/O hydrocele    of scrotum   Hypertension    IDA (iron deficiency anemia)     Past Surgical History:  Procedure Laterality Date   BIOPSY  10/21/2020   Procedure: BIOPSY;  Surgeon: Albertus Gordy HERO, MD;  Location: [facility] ENDOSCOPY;  Service: Gastroenterology;;   COLONOSCOPY     ENTEROSCOPY N/A 12/11/2023   Procedure: ENTEROSCOPY;  Surgeon: Wilhelmenia Aloha Raddle., MD;  Location: [facility] ENDOSCOPY;  Service: Gastroenterology;  Laterality: N/A;   ESOPHAGOGASTRODUODENOSCOPY (EGD) WITH PROPOFOL  N/A 10/21/2020   Procedure: ESOPHAGOGASTRODUODENOSCOPY (EGD) WITH PROPOFOL ;  Surgeon: Albertus Gordy HERO, MD;  Location: [facility] ENDOSCOPY;  Service: Gastroenterology;  Laterality: N/A;   HELLER MYOTOMY  1996   in [place]; w/gastric pull thru   HEMOSTASIS CLIP PLACEMENT  10/21/2020   Procedure: HEMOSTASIS CLIP PLACEMENT;  Surgeon: Albertus Gordy HERO, MD;  Location: [facility] ENDOSCOPY;  Service: Gastroenterology;;     Prior to Admission medications   Medication Sig Start Date End Date Taking? Authorizing Provider  HYDROcodone -acetaminophen  (NORCO/VICODIN) 5-325 MG tablet Take 1 tablet by mouth every 6 (six) hours as needed for moderate pain (pain score 4-6). 01/25/24  Yes Saguier, Loving, PA-C  Iron, Ferrous Sulfate, 325 (65 Fe) MG TABS Take 1 tablet (325 mg) by mouth daily. 12/19/23  Yes Saguier, Loving, PA-C  metoprolol  succinate (TOPROL -XL) 50 MG 24 hr tablet Take 1 tablet (50 mg total) by mouth daily. TAKE WITH OR IMMEDIATELY FOLLOWING A MEAL. 12/12/23 03/11/24 Yes Raenelle Coria, MD  pantoprazole  (PROTONIX ) 40 MG tablet Take 1 tablet (40 mg total) by mouth 2 (two) times daily. 12/12/23  Yes Raenelle Coria, MD  PARoxetine  (PAXIL ) 10 MG tablet Take 1 tablet (10 mg total) by mouth daily. 12/12/23  Yes Raenelle Coria, MD  sucralfate  (CARAFATE ) 1 GM/10ML suspension Take 10 mLs (1 g total) by mouth 4 (four) times daily -  with meals and at bedtime. 12/28/23  Yes Saguier, Loving, PA-C  docusate sodium  (COLACE) 100 MG capsule Take 1 capsule (100 mg total) by mouth 2 (two) times daily. Patient not taking: Reported on 01/24/2024 12/18/23   Saguier, Loving, PA-C  polyethylene glycol (MIRALAX  / GLYCOLAX ) 17 g packet Take 17 g by mouth daily. Patient not taking: No sig reported 12/12/23   Raenelle Coria, MD    Current Outpatient Medications  Medication Sig Dispense Refill   HYDROcodone -acetaminophen  (NORCO/VICODIN) 5-325 MG tablet Take 1 tablet by mouth every 6 (  six) hours as needed for moderate pain (pain score 4-6). 20 tablet 0   Iron , Ferrous Sulfate , 325 (65 Fe) MG TABS Take 1 tablet (325 mg) by mouth daily. 30 tablet 1   metoprolol  succinate (TOPROL -XL) 50 MG 24 hr tablet Take 1 tablet (50 mg total) by mouth daily. TAKE WITH OR IMMEDIATELY FOLLOWING A MEAL. 30 tablet 11   pantoprazole  (PROTONIX ) 40 MG tablet Take 1 tablet (40 mg total) by mouth 2 (two) times daily. 90 tablet 3   PARoxetine  (PAXIL ) 10 MG tablet Take  1 tablet (10 mg total) by mouth daily. 90 tablet 0   sucralfate  (CARAFATE ) 1 GM/10ML suspension Take 10 mLs (1 g total) by mouth 4 (four) times daily -  with meals and at bedtime. 420 mL 0   docusate sodium  (COLACE) 100 MG capsule Take 1 capsule (100 mg total) by mouth 2 (two) times daily. (Patient not taking: Reported on 01/24/2024) 20 capsule 0   polyethylene glycol (MIRALAX  / GLYCOLAX ) 17 g packet Take 17 g by mouth daily. (Patient not taking: No sig reported) 14 each 0   Current Facility-Administered Medications  Medication Dose Route Frequency Provider Last Rate Last Admin   0.9 %  sodium chloride  infusion  500 mL Intravenous Continuous Devita Nies, Gordy HERO, MD        Allergies as of 02/06/2024   (No Known Allergies)    Family History  Problem Relation Age of Onset   Cervical cancer Mother    Alzheimer's disease Father    Stomach cancer Neg Hx    Esophageal cancer Neg Hx    Pancreatic cancer Neg Hx    Colon cancer Neg Hx    Rectal cancer Neg Hx    Colon polyps Neg Hx     Social History   Socioeconomic History   Marital status: Legally Separated    Spouse name: Not on file   Number of children: Not on file   Years of education: Not on file   Highest education level: Not on file  Occupational History   Not on file  Tobacco Use   Smoking status: Former    Current packs/day: 0.25    Average packs/day: 0.3 packs/day for 18.0 years (4.5 ttl pk-yrs)    Types: Cigarettes   Smokeless tobacco: Never  Vaping Use   Vaping status: Never Used  Substance and Sexual Activity   Alcohol use: Not Currently    Comment: 1.5 L rum every 10 days (per hospital record 10/21/20)   Drug use: Never   Sexual activity: Not on file  Other Topics Concern   Not on file  Social History Narrative   Not on file   Social Drivers of Health   Financial Resource Strain: Not on file  Food Insecurity: No Food Insecurity (12/09/2023)   Hunger Vital Sign    Worried About Running Out of Food in the Last  Year: Never true    Ran Out of Food in the Last Year: Never true  Transportation Needs: No Transportation Needs (12/09/2023)   PRAPARE - Administrator, Civil Service (Medical): No    Lack of Transportation (Non-Medical): No  Physical Activity: Not on file  Stress: Not on file  Social Connections: Unknown (08/02/2021)   Received from [facility]   Social Network    Social Network: Not on file  Intimate Partner Violence: Not At Risk (12/09/2023)   Humiliation, Afraid, Rape, and Kick questionnaire    Fear of Current or Ex-Partner: No  Emotionally Abused: No    Physically Abused: No    Sexually Abused: No    Physical Exam: Vital signs in last 24 hours: @BP  (!) 146/80   Pulse 72   Temp (!) 97.5 F (36.4 C)   Ht 5' 7 (1.702 m)   Wt 117 lb (53.1 kg)   SpO2 99%   BMI 18.32 kg/m  GEN: NAD EYE: Sclerae anicteric ENT: MMM CV: Non-tachycardic Pulm: CTA b/l GI: Soft, NT/ND NEURO:  Alert & Oriented x 3   Gordy Starch, MD [facility]  02/06/2024 11:27 AM

## 2024-02-07 ENCOUNTER — Telehealth: Payer: Self-pay

## 2024-02-07 NOTE — Telephone Encounter (Signed)
  Follow up Call-     02/06/2024   10:23 AM  Call back number  Post procedure Call Back phone  # [phone]  Permission to leave phone message Yes     Patient questions:  Do you have a fever, pain , or abdominal swelling? No. Pain Score  0 *  Have you tolerated food without any problems? Yes.    Have you been able to return to your normal activities? Yes.    Do you have any questions about your discharge instructions: Diet   No. Medications  No. Follow up visit  No.  Do you have questions or concerns about your Care? No.  Actions: * If pain score is 4 or above: No action needed, pain <4.

## 2024-02-18 ENCOUNTER — Other Ambulatory Visit (HOSPITAL_BASED_OUTPATIENT_CLINIC_OR_DEPARTMENT_OTHER): Payer: Self-pay

## 2024-02-25 ENCOUNTER — Ambulatory Visit: Admitting: Medical

## 2024-02-29 ENCOUNTER — Ambulatory Visit: Admitting: Medical

## 2024-02-29 ENCOUNTER — Other Ambulatory Visit: Payer: Self-pay

## 2024-02-29 ENCOUNTER — Other Ambulatory Visit (HOSPITAL_BASED_OUTPATIENT_CLINIC_OR_DEPARTMENT_OTHER): Payer: Self-pay

## 2024-02-29 ENCOUNTER — Ambulatory Visit (HOSPITAL_BASED_OUTPATIENT_CLINIC_OR_DEPARTMENT_OTHER)
Admission: RE | Admit: 2024-02-29 | Discharge: 2024-02-29 | Disposition: A | Source: Ambulatory Visit | Attending: Medical | Admitting: Medical

## 2024-02-29 VITALS — BP 190/92 | HR 59 | Temp 97.7°F | Resp 16 | Ht 67.0 in | Wt 120.0 lb

## 2024-02-29 DIAGNOSIS — M545 Low back pain, unspecified: Secondary | ICD-10-CM

## 2024-02-29 MED ORDER — HYDROCODONE-ACETAMINOPHEN 5-325 MG PO TABS
1.0000 | ORAL_TABLET | Freq: Four times a day (QID) | ORAL | 0 refills | Status: AC | PRN
  Filled 2024-02-29 (×2): qty 20, 5d supply, fill #0

## 2024-02-29 MED ORDER — IRON (FERROUS SULFATE) 325 (65 FE) MG PO TABS
325.0000 mg | ORAL_TABLET | Freq: Every day | ORAL | 0 refills | Status: AC
  Filled 2024-02-29: qty 30, 30d supply, fill #0
  Filled 2024-02-29: qty 100, 100d supply, fill #0

## 2024-02-29 NOTE — Patient Instructions (Addendum)
 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 Previous x-ray showed L4-5, L5-S1 degenerative changes. Differential includes muscle strain, osteoarthritis.(R/O fx) - Ordered lumbar x-ray to assess for fractures. - Prescribed Norco for 5 days.(limted rx). Nsaids contraindicated due to gi bleed hx. - Advised use of elastic support belts. - Provided back stretching exercises if x-ray  shows no fx. - Follow-up after x-ray results.  Iron  deficiency anemia Managed with oral iron . Hematologist advised continuation without transfusion or infusions. - Refilled iron  tablets, one daily. - Scheduled follow-up lab work in January per hematologist.

## 2024-02-29 NOTE — Progress Notes (Signed)
° °  Subjective:    Patient ID: Nathaniel Williams, male    DOB: 04-22-1960, 63 y.o.   MRN: [id number]  HPI  Nathaniel Williams is a 63 year old male with degenerative changes in the lumbar spine who presents with low back pain after a fall.  He has had severe low back pain since slipping on black ice at his apartment complex on Monday. 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 He missed two days of work and returned on Wednesday.  He previously had lumbar x-rays and chronic low-level pain in this area, which had improved before the fall.  He is not taking any pain medication now. He ran out of Norco for former rib fractures  and cannot take NSAIDs due to stomach hx of gi bleed. He also finished his iron  pills last week and has not refilled them.  In November he had right rib fractures and was treated with Norco for several weeks without side effects. He reports mild anemia and has been taking one iron  tablet daily prior to running out, with follow-up labs planned for January. Hematologist per pt states no iron  transfusion needed.  He denies leg pain, radiation of pain to the legs, or side effects from prior narcotics.  Review of Systems See hpi    Objective:   Physical Exam  General Appearance- Not in acute distress.    Chest and Lung Exam Auscultation: Breath sounds:-Normal. Clear even and unlabored. Adventitious sounds:- No Adventitious sounds.  Cardiovascular Auscultation:Rythm - Regular, rate and rythm. Heart Sounds -Normal heart sounds.  Abdomen Inspection:-Inspection Normal.  Palpation/Perucssion: Palpation and Percussion of the abdomen reveal- Non Tender, No Rebound tenderness, No rigidity(Guarding) and No Palpable abdominal masses.  Liver:-Normal.  Spleen:- Normal.   Back Mid lumbar spine tenderness to palpation.  Lower ext  neurologic  L5-S1 sensation intact bilaterally. Normal patellar reflexes bilaterally. No foot drop bilaterally.       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 Previous x-ray showed L4-5, L5-S1 degenerative changes. Differential includes muscle strain, osteoarthritis.(R/O fx) - Ordered lumbar x-ray to assess for fractures. - Prescribed Norco for 5 days.(limted rx). Nsaids contraindicated due to gi bleed hx. - Advised use of elastic support belts. - Provided back stretching exercises if x-ray  shows no fx. - Follow-up after x-ray results.  Iron  deficiency anemia Managed with oral iron . Hematologist advised continuation without transfusion or infusions. - Refilled iron  tablets, one daily. - Scheduled follow-up lab work in January per hematologist.   Zavior Thomason, PA-C

## 2024-03-01 ENCOUNTER — Ambulatory Visit: Payer: Self-pay | Admitting: Medical

## 2024-03-03 ENCOUNTER — Other Ambulatory Visit: Payer: Self-pay

## 2024-03-03 NOTE — Progress Notes (Signed)
Pt called and aware

## 2024-03-16 ENCOUNTER — Other Ambulatory Visit (HOSPITAL_BASED_OUTPATIENT_CLINIC_OR_DEPARTMENT_OTHER): Payer: Self-pay

## 2024-03-17 ENCOUNTER — Other Ambulatory Visit: Payer: Self-pay

## 2024-04-02 ENCOUNTER — Other Ambulatory Visit (HOSPITAL_BASED_OUTPATIENT_CLINIC_OR_DEPARTMENT_OTHER): Payer: Self-pay

## 2024-04-04 ENCOUNTER — Other Ambulatory Visit (HOSPITAL_BASED_OUTPATIENT_CLINIC_OR_DEPARTMENT_OTHER): Payer: Self-pay

## 2024-04-04 ENCOUNTER — Telehealth: Payer: Self-pay

## 2024-04-04 ENCOUNTER — Ambulatory Visit: Payer: Self-pay

## 2024-04-04 ENCOUNTER — Other Ambulatory Visit: Payer: Self-pay | Admitting: Medical

## 2024-04-04 MED ORDER — PAROXETINE HCL 10 MG PO TABS
10.0000 mg | ORAL_TABLET | Freq: Every day | ORAL | 0 refills | Status: AC
  Filled 2024-04-04: qty 90, 90d supply, fill #0

## 2024-04-04 NOTE — Telephone Encounter (Signed)
 Rx refill paxil  sent to pt pharmacy.

## 2024-04-04 NOTE — Telephone Encounter (Signed)
 This has already been addressed and sent to pcp by mychart refills. Waiting on an approval since it was rx by a different provider    Copied from CRM [id number]. Topic: Clinical - Medication Refill >> Apr 04, 2024 11:43 AM Jayma L wrote: Medication: PARoxetine  (PAXIL ) 10 MG tablet  Has the patient contacted their pharmacy? Yes (Agent: If no, request that the patient contact the pharmacy for the refill. If patient does not wish to contact the pharmacy document the reason why and proceed with request.) (Agent: If yes, when and what did the pharmacy advise?)  This is the patient's preferred pharmacy:  [facility] [place] [facility] [address] [place] [address] Phone: [phone] Fax: [phone]  Is this the correct pharmacy for this prescription? Yes If no, delete pharmacy and type the correct one.   Has the prescription been filled recently? No  Is the patient out of the medication? Yes  Has the patient been seen for an appointment in the last year OR does the patient have an upcoming appointment? Yes  Can we respond through MyChart? Yes  Agent: Please be advised that Rx refills may take up to 3 business days. We ask that you follow-up with your pharmacy.

## 2024-04-04 NOTE — Telephone Encounter (Signed)
 Was recently diagnosed with glaucoma, now reports trouble seeing at night while driving.   FYI Only or Action Required?: FYI only for provider: appointment scheduled on Monday.  Patient was last seen in primary care on 02/29/2024 by Dorina Loving, PA-C.  Called Nurse Triage reporting Eye Problem.  Symptoms began several months ago.  Interventions attempted: Nothing.  Symptoms are: gradually worsening.  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  Reason for Disposition  [1] Blurred vision or visual changes AND [2] gradual onset (e.g., weeks, months)  Answer Assessment - Initial Assessment Questions 1. DESCRIPTION: How has your vision changed? (e.g., complete vision loss, blurred vision, double vision, floaters, etc.)     Lights coming from the other direction, when driving at night, been affecting me. Light is so bright, it's glaring and blurring. I got to squint my eyes or slow down. But no problems during the day.   2. LOCATION: One or both eyes? If one, ask: Which eye?     Both   4. ONSET: When did this begin? Did it start suddenly or has this been gradual?     Started about within the last three months  5. PATTERN: Does this come and go, or has it been constant since it started?     Especially at night.   6. PAIN: Is there any pain in your eye(s)?  (Scale 1-10; or mild, moderate, severe)     Denies  7. CONTACTS-GLASSES: Do you wear contacts or glasses?     Glasses  Protocols used: Vision Loss or Change-A-AH

## 2024-04-04 NOTE — Telephone Encounter (Signed)
 Appt scheduled

## 2024-04-05 ENCOUNTER — Other Ambulatory Visit (HOSPITAL_BASED_OUTPATIENT_CLINIC_OR_DEPARTMENT_OTHER): Payer: Self-pay

## 2024-04-07 ENCOUNTER — Ambulatory Visit: Admitting: Medical

## 2024-04-16 ENCOUNTER — Other Ambulatory Visit (HOSPITAL_BASED_OUTPATIENT_CLINIC_OR_DEPARTMENT_OTHER): Payer: Self-pay
